# Patient Record
Sex: Female | Born: 1945 | Race: White | Hispanic: No | Marital: Married | Smoking: Current every day smoker
Health system: Southern US, Community
[De-identification: ages and names within clinical notes are randomized; demographics above are authoritative.]

## PROBLEM LIST (undated history)

## (undated) DIAGNOSIS — R3129 Other microscopic hematuria: Secondary | ICD-10-CM

## (undated) DIAGNOSIS — I10 Essential (primary) hypertension: Secondary | ICD-10-CM

## (undated) DIAGNOSIS — K759 Inflammatory liver disease, unspecified: Secondary | ICD-10-CM

## (undated) DIAGNOSIS — E669 Obesity, unspecified: Secondary | ICD-10-CM

## (undated) DIAGNOSIS — K760 Fatty (change of) liver, not elsewhere classified: Secondary | ICD-10-CM

## (undated) DIAGNOSIS — M5020 Other cervical disc displacement, unspecified cervical region: Secondary | ICD-10-CM

## (undated) DIAGNOSIS — E119 Type 2 diabetes mellitus without complications: Secondary | ICD-10-CM

## (undated) DIAGNOSIS — F419 Anxiety disorder, unspecified: Secondary | ICD-10-CM

## (undated) DIAGNOSIS — M79661 Pain in right lower leg: Secondary | ICD-10-CM

## (undated) DIAGNOSIS — M79662 Pain in left lower leg: Secondary | ICD-10-CM

## (undated) DIAGNOSIS — M7989 Other specified soft tissue disorders: Secondary | ICD-10-CM

## (undated) HISTORY — DX: Pain in right lower leg: M79.662

## (undated) HISTORY — DX: Type 2 diabetes mellitus without complications: E11.9

## (undated) HISTORY — PX: CHOLECYSTECTOMY: SHX55

## (undated) HISTORY — DX: Other specified soft tissue disorders: M79.89

## (undated) HISTORY — DX: Essential (primary) hypertension: I10

## (undated) HISTORY — DX: Pain in right lower leg: M79.661

## (undated) HISTORY — DX: Other microscopic hematuria: R31.29

## (undated) HISTORY — DX: Other cervical disc displacement, unspecified cervical region: M50.20

## (undated) HISTORY — DX: Fatty (change of) liver, not elsewhere classified: K76.0

## (undated) HISTORY — PX: HERNIA REPAIR: SHX51

## (undated) HISTORY — DX: Obesity, unspecified: E66.9

---

## 1966-08-09 HISTORY — PX: APPENDECTOMY: SHX54

## 1980-08-09 HISTORY — PX: GALLBLADDER SURGERY: SHX652

## 1996-08-09 HISTORY — PX: TOTAL ABDOMINAL HYSTERECTOMY W/ BILATERAL SALPINGOOPHORECTOMY: SHX83

## 1998-09-29 ENCOUNTER — Other Ambulatory Visit: Admission: RE | Admit: 1998-09-29 | Discharge: 1998-09-29 | Payer: Self-pay | Admitting: Obstetrics and Gynecology

## 2005-05-10 ENCOUNTER — Ambulatory Visit: Payer: Self-pay

## 2005-05-10 IMAGING — CR DG KNEE COMPLETE 4+V*R*
1 series · 4 of 4 positions shown · non-contrast
Comparison: none

REASON FOR EXAM: KNEE PAIN
COMMENTS:

[Series 1: view not recorded · 0.17mm/px · 4 of 4 slices shown]
[im 1/4]
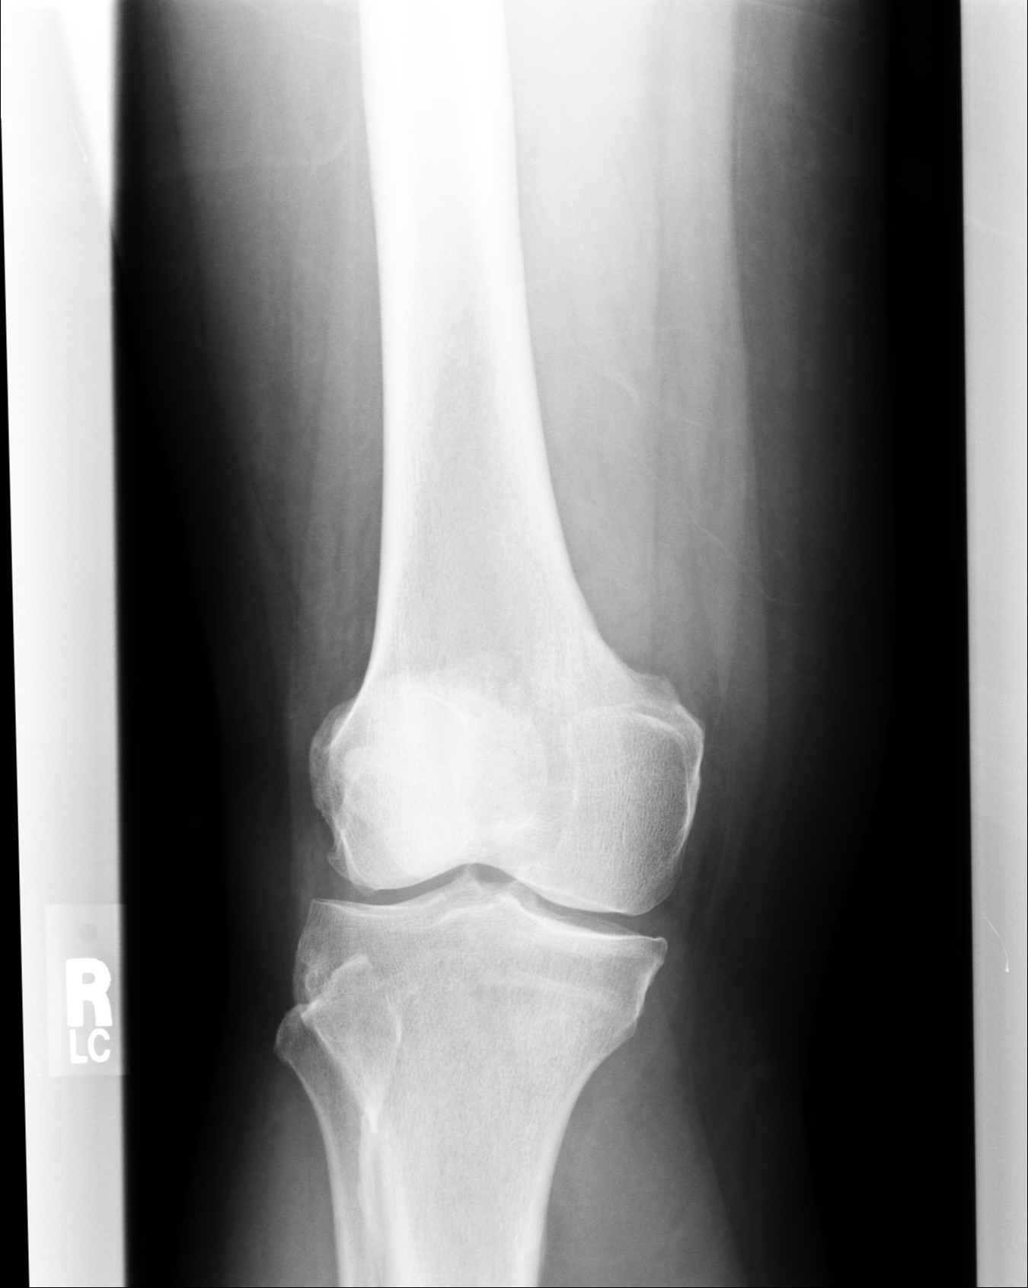
[im 2/4]
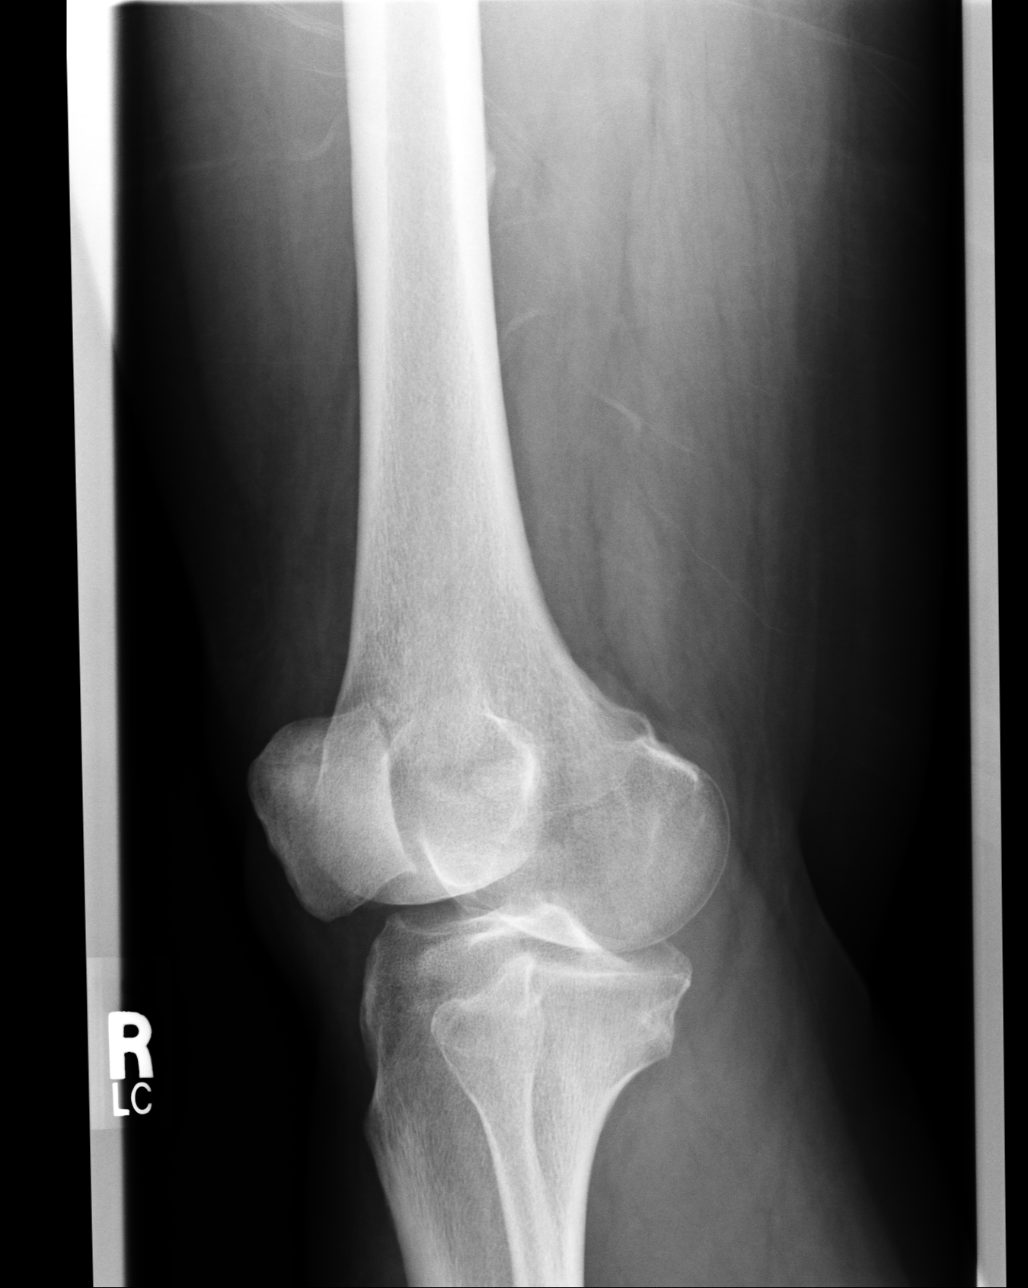
[im 3/4]
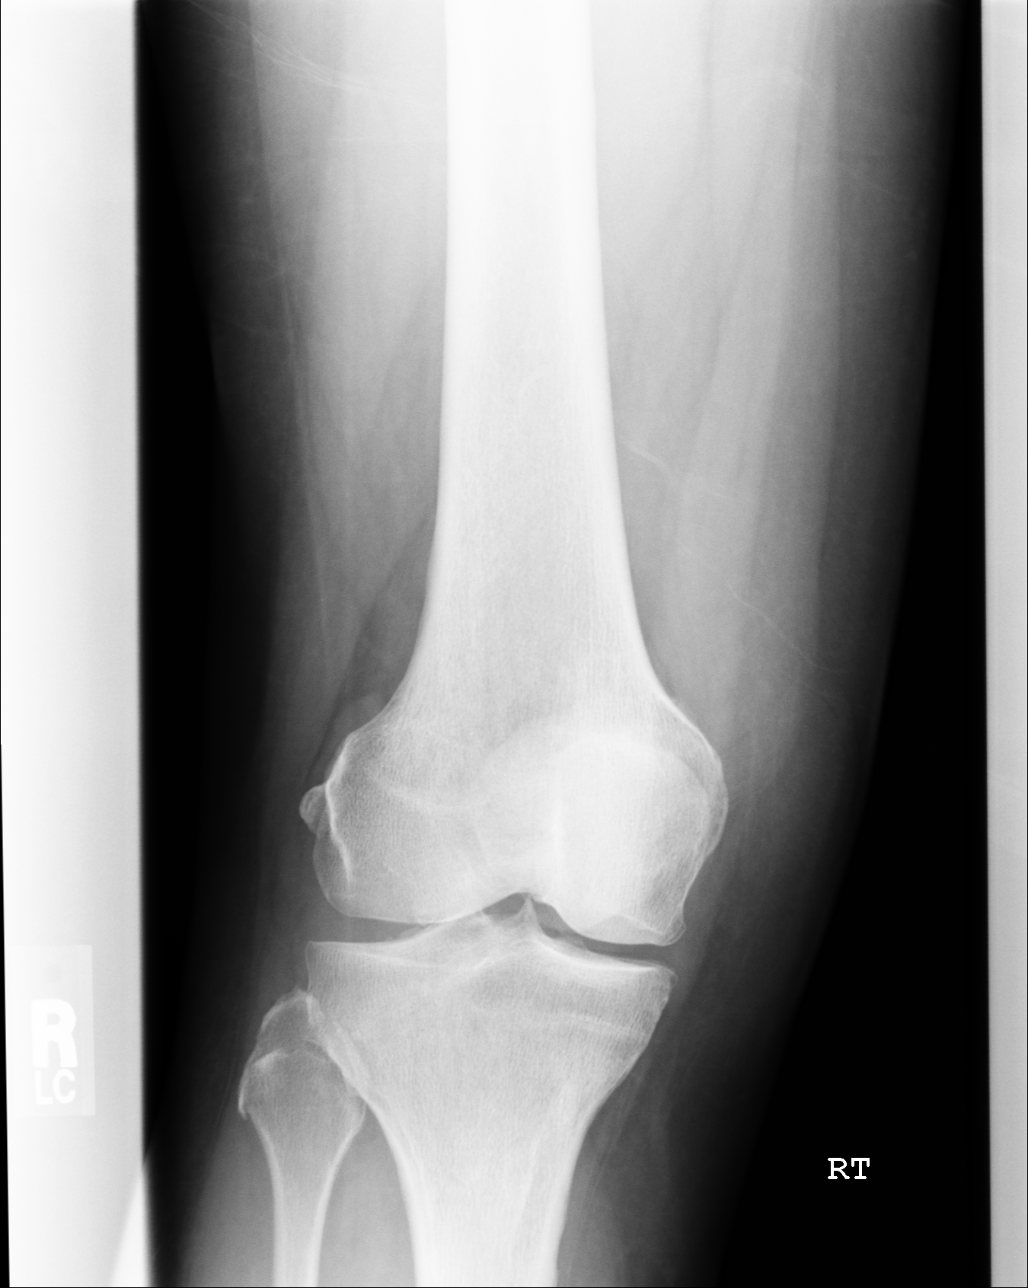
[im 4/4]
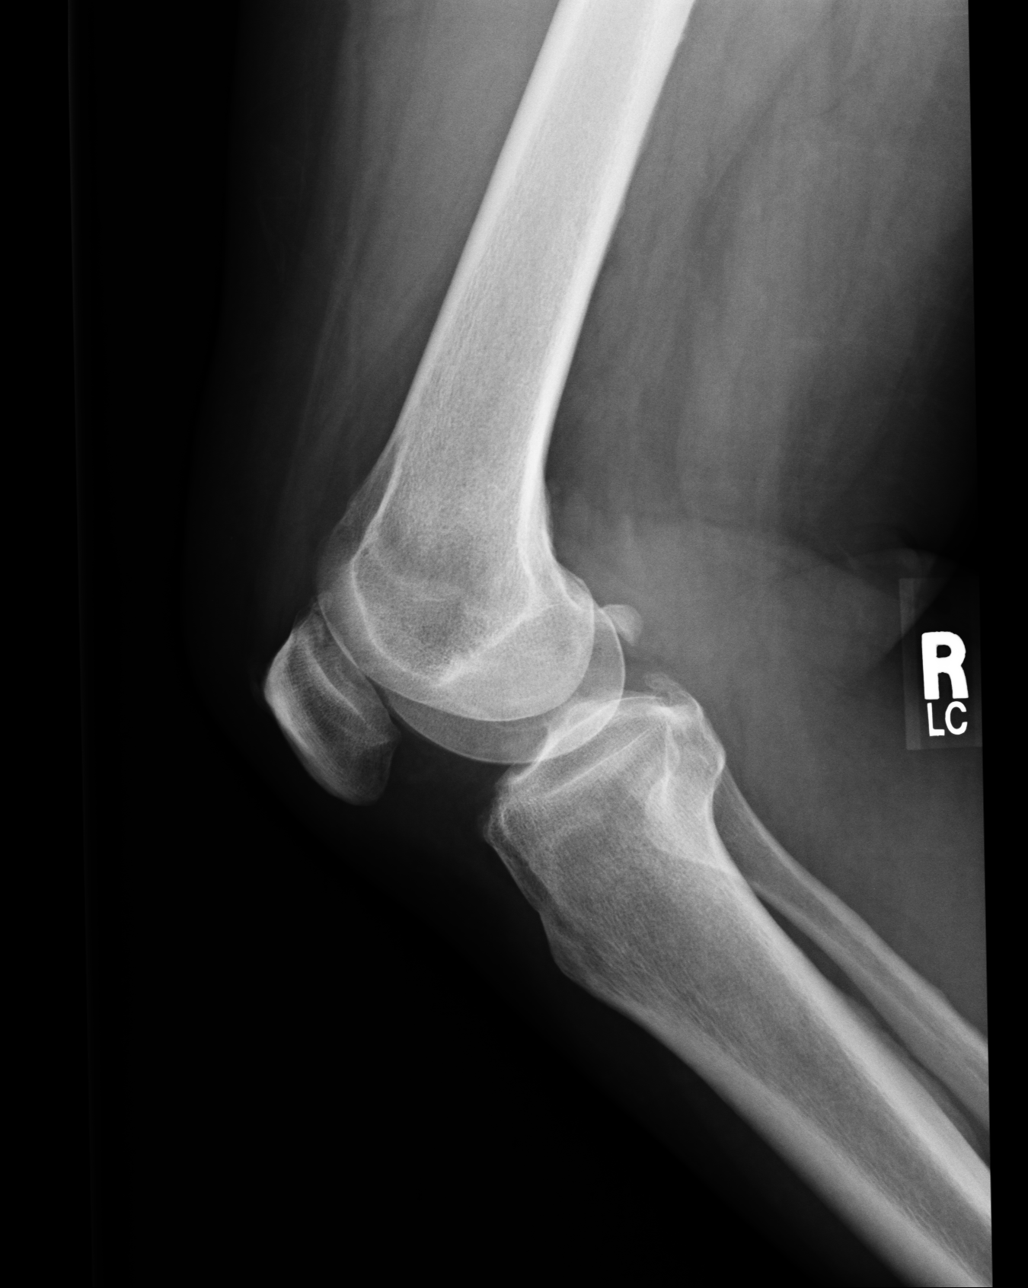

[4 of 4 positions shown; findings below may reference images not displayed]

PROCEDURE:     DXR - DXR KNEE RT COMP WITH OBLIQUES  - [DATE]  [DATE]

RESULT:       Four views of the RIGHT knee show no fracture, dislocation or
other acute bony abnormality.  There is noted very slight hypertrophic
spurring at the knee medially and laterally.  The knee joint space is well
maintained.  The patella is intact.
IMPRESSION: 1.     No acute bony abnormalities are seen.
2.     There is very slight hypertrophic spurring about the knee medially
and laterally.

## 2006-02-03 ENCOUNTER — Ambulatory Visit: Payer: Self-pay | Admitting: Gastroenterology

## 2006-06-16 ENCOUNTER — Ambulatory Visit: Payer: Self-pay | Admitting: Internal Medicine

## 2009-09-09 ENCOUNTER — Ambulatory Visit: Payer: Self-pay | Admitting: Internal Medicine

## 2009-09-09 IMAGING — US US RENAL KIDNEY
1 series · 14 of 25 positions shown · non-contrast
Comparison: none

REASON FOR EXAM: hematuria
COMMENTS:

[Series 1: us renal kidney · 0.39mm/px · 14 of 36 slices shown]
[im 1/36]
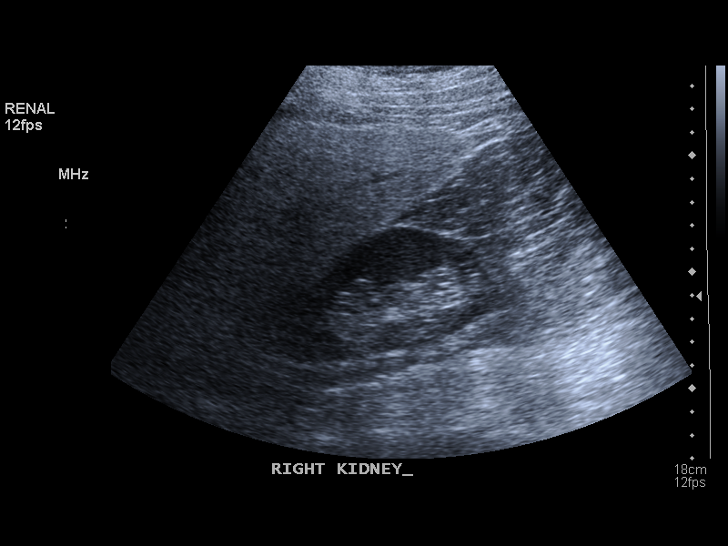
[im 3/36]
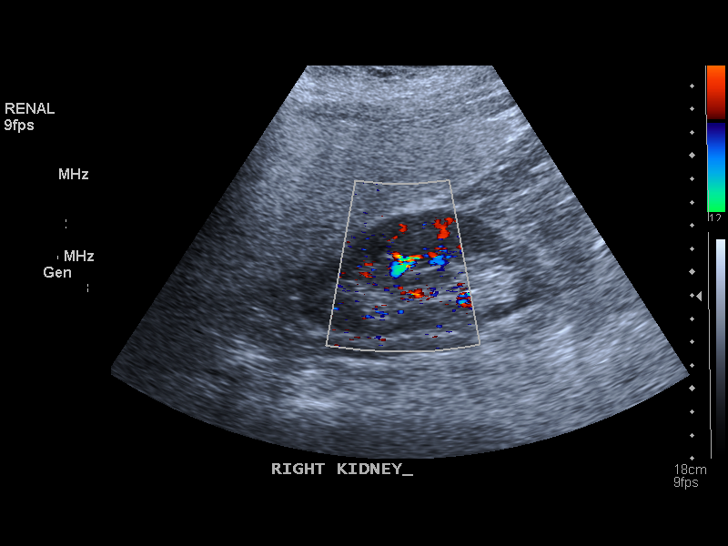
[im 6/36]
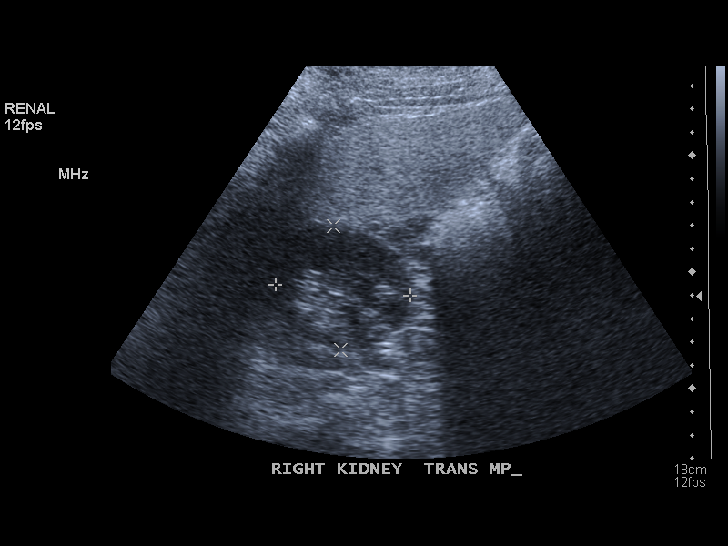
[im 9/36]
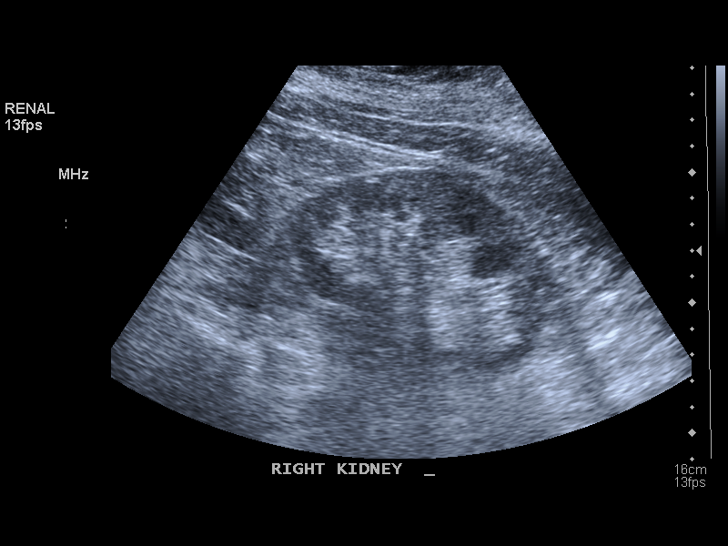
[im 12/36]
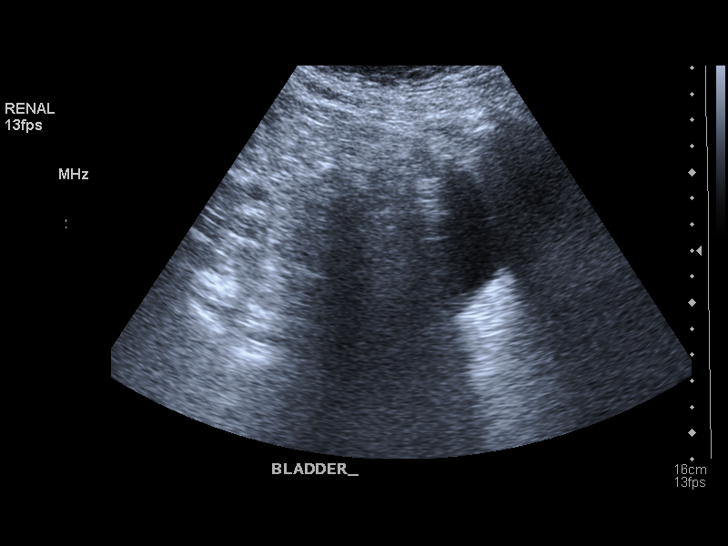
[im 14/36]
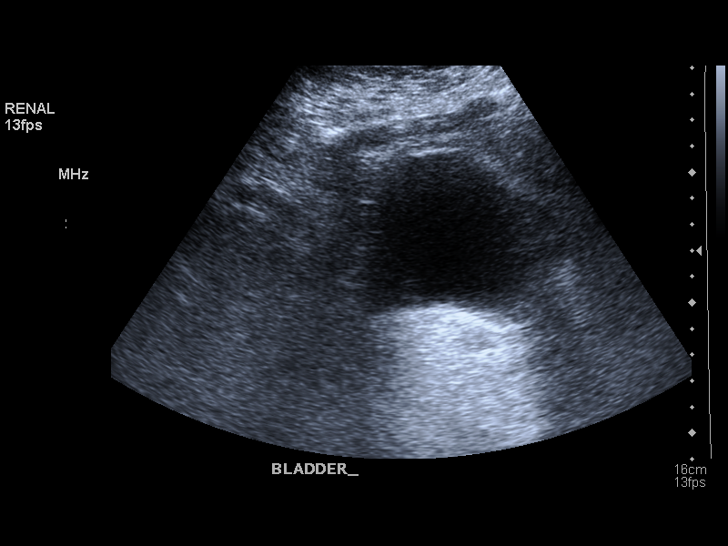
[im 17/36]
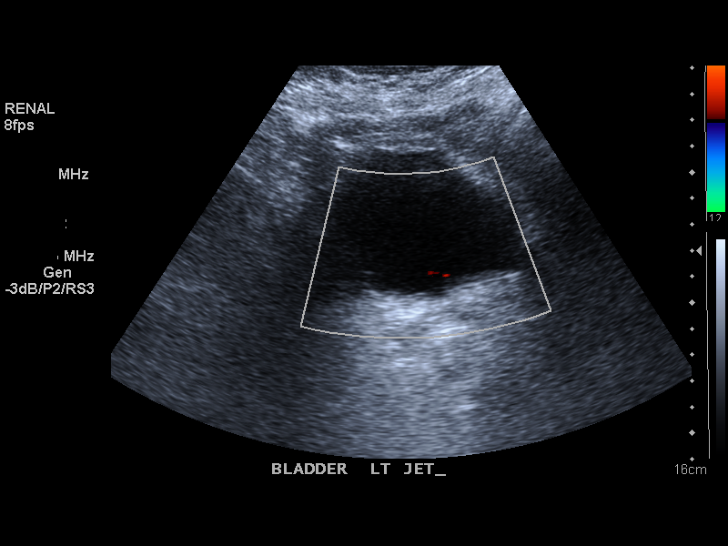
[im 19/36]
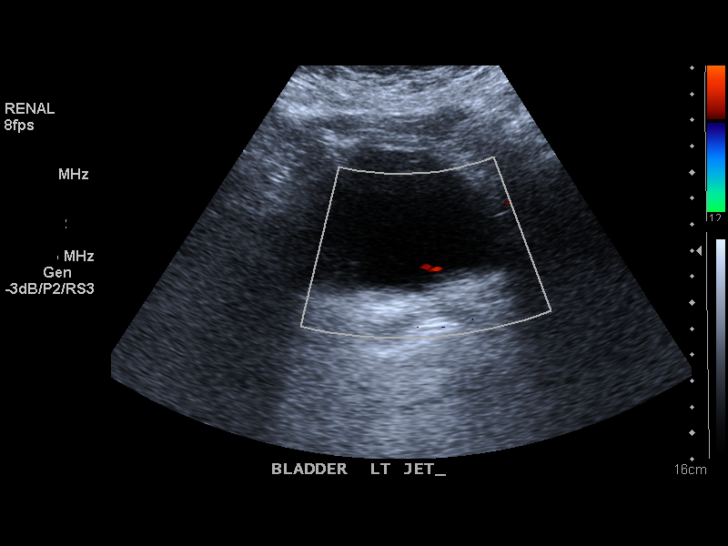
[im 22/36]
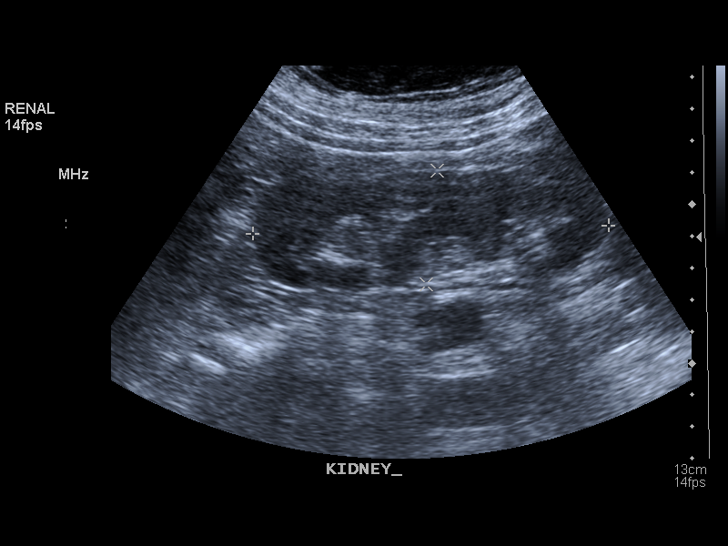
[im 24/36]
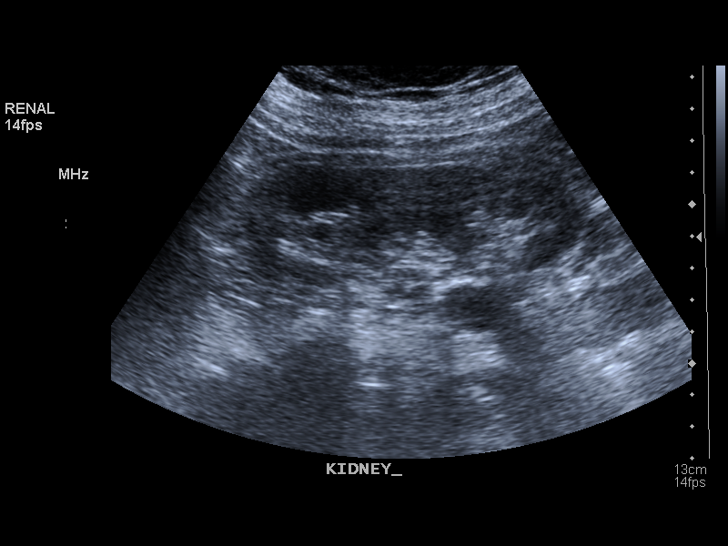
[im 27/36]
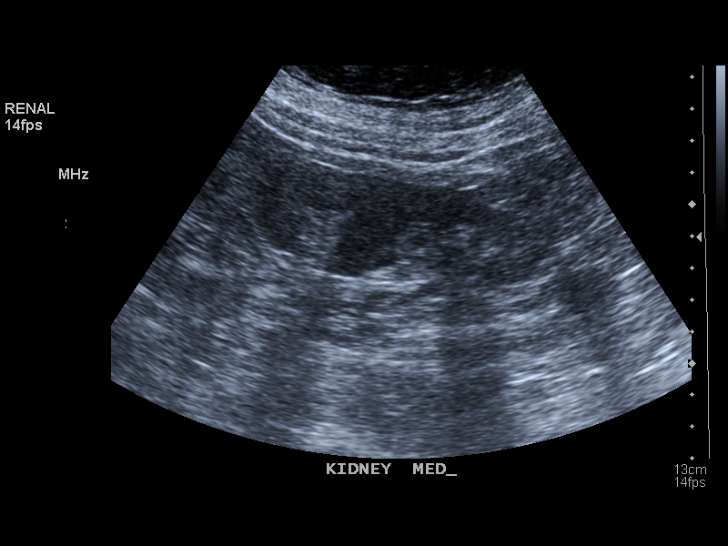
[im 30/36]
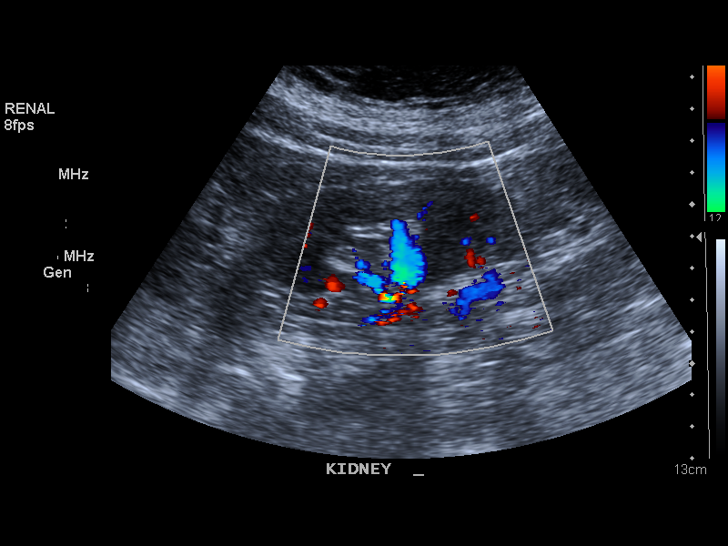
[im 33/36]
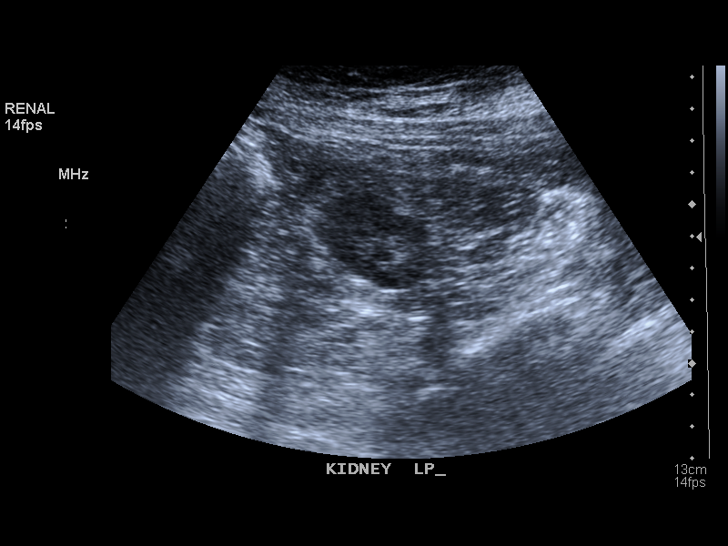
[im 36/36]
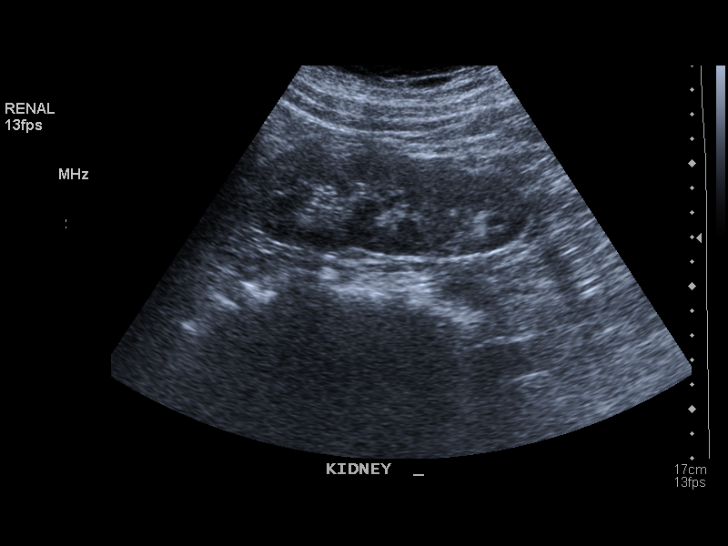

[14 of 25 positions shown; findings below may reference images not displayed]

PROCEDURE:     US  - US KIDNEY  - [DATE]  [DATE]

RESULT:

The right kidney measures 11.2 x 5.8 x 5.3 cm and the left 11.2 x 4 x
cm. There is appropriate corticomedullary differentiation without evidence
of hydronephrosis, masses or calculi. The urinary bladder is partially
distended with urine. Bilateral ureteral jets are identified within the
bladder.
IMPRESSION: Unremarkable Renal Ultrasound.

## 2010-01-26 ENCOUNTER — Ambulatory Visit: Payer: Self-pay | Admitting: General Practice

## 2010-01-26 IMAGING — CT CT ABDOMEN AND PELVIS WITHOUT AND WITH CONTRAST
1 of 5 series · 8 of 32 positions shown, 13 images · non-contrast
Comparison: none

REASON FOR EXAM: hematuria
COMMENTS:

PROCEDURE:     CT  - CT ABDOMEN / PELVIS  W/WO  - [DATE] [DATE]
RESULT:
TECHNIQUE: Triphasic CT of the abdomen and pelvis is performed utilizing 100
ml of [Q7] iodinated intravenous contrast. No oral contrast was
administered.
There is no previous exam for comparison.

[Series 5: delay · axial · delayed · 0.89mm/px · z∈[-592,-236]mm · 8 of 93 slices shown, 13 images]
[im 11/93  soft-tissue]
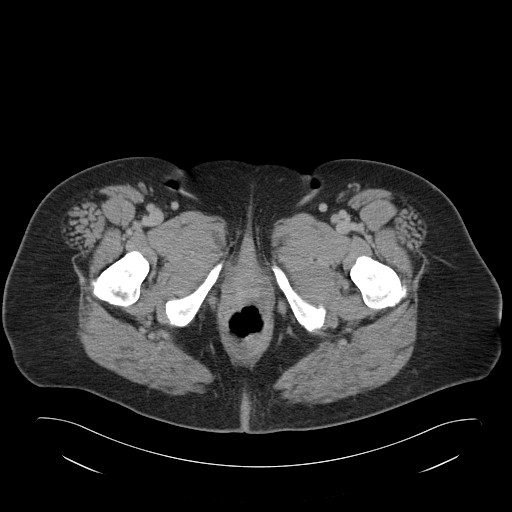
[im 11/93  bone]
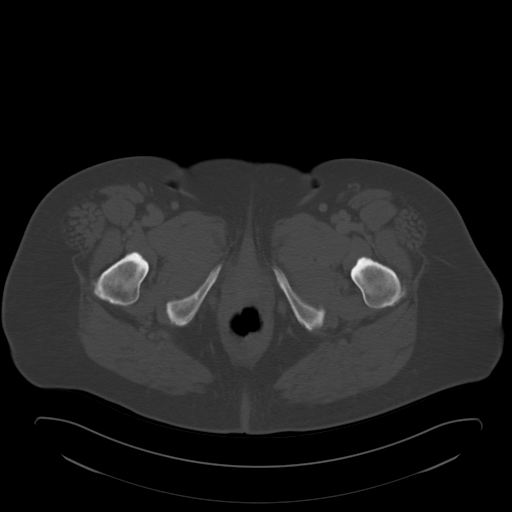
[im 21/93  soft-tissue]
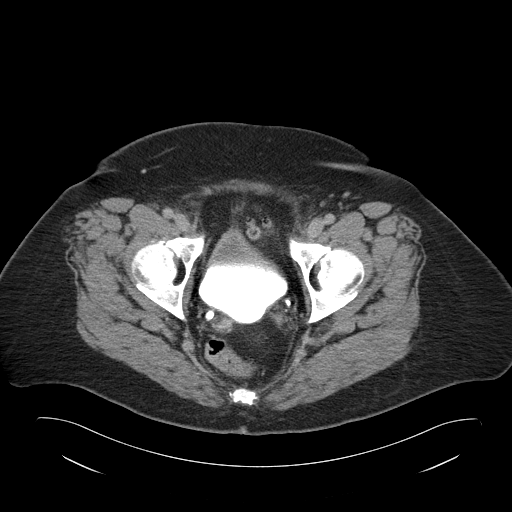
[im 31/93  soft-tissue]
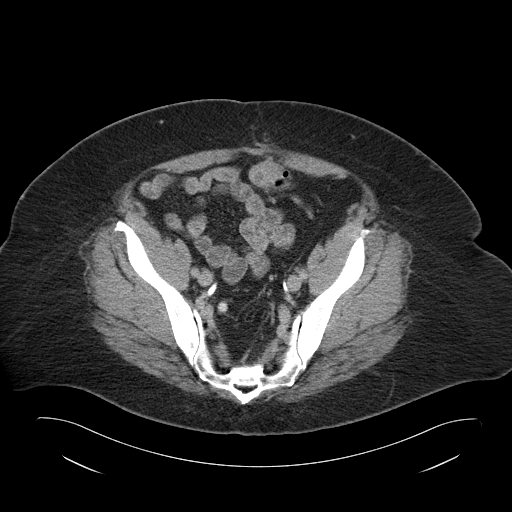
[im 41/93  soft-tissue]
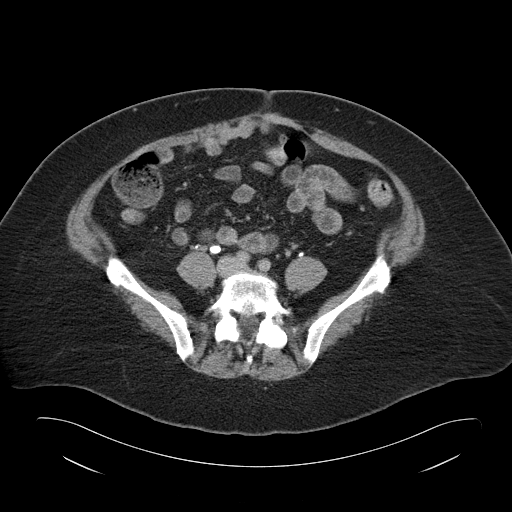
[im 52/93  soft-tissue]
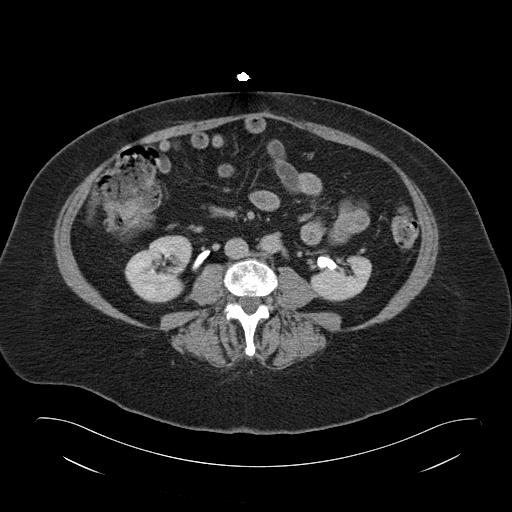
[im 52/93  lung]
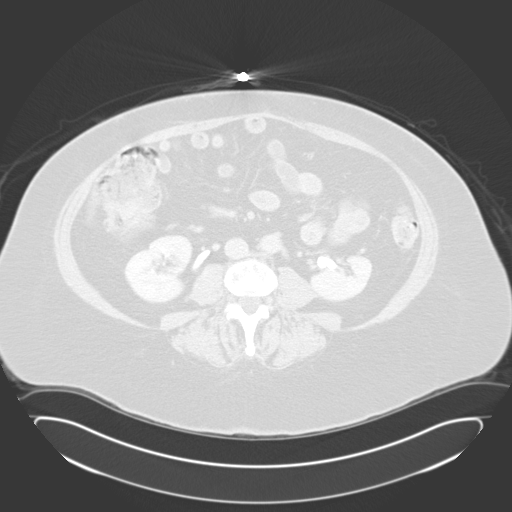
[im 62/93  soft-tissue]
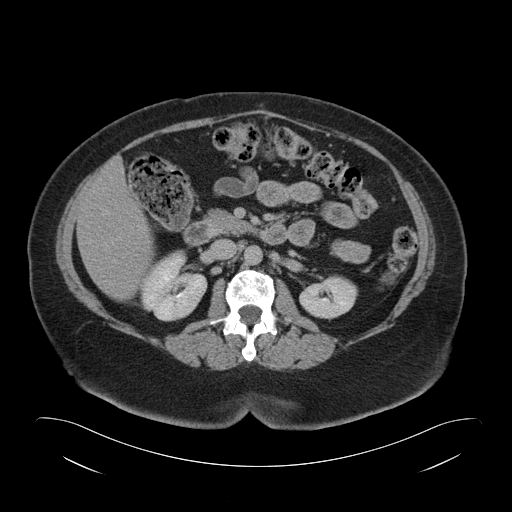
[im 62/93  lung]
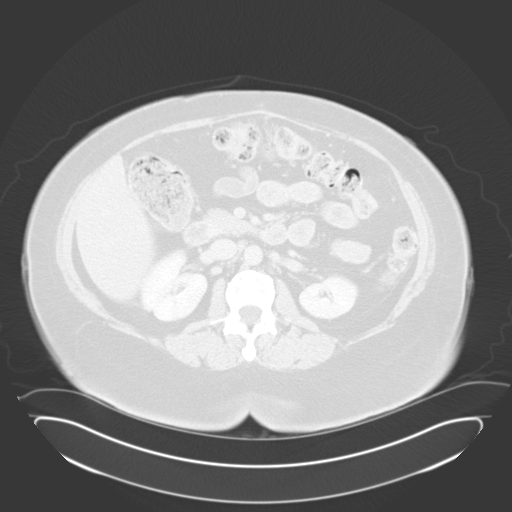
[im 72/93  soft-tissue]
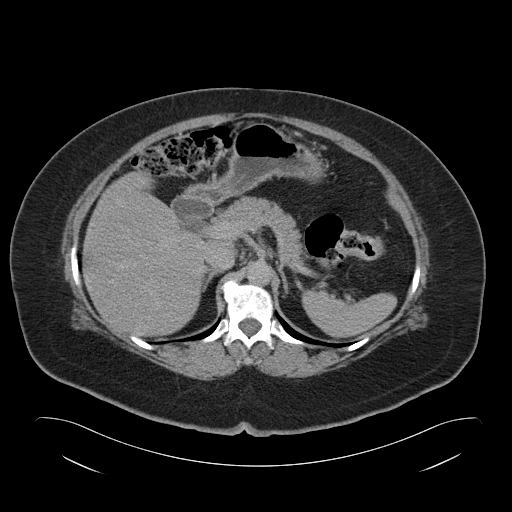
[im 72/93  lung]
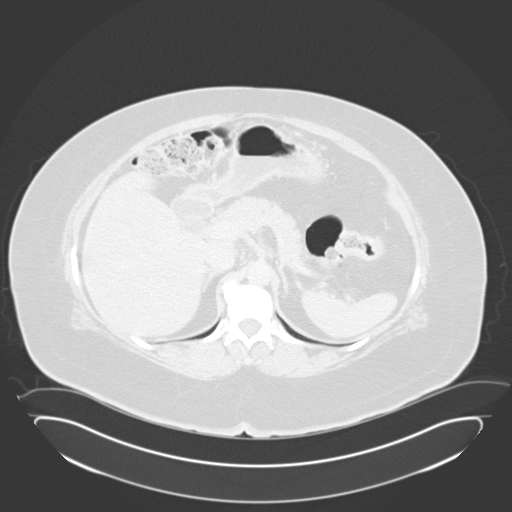
[im 82/93  soft-tissue]
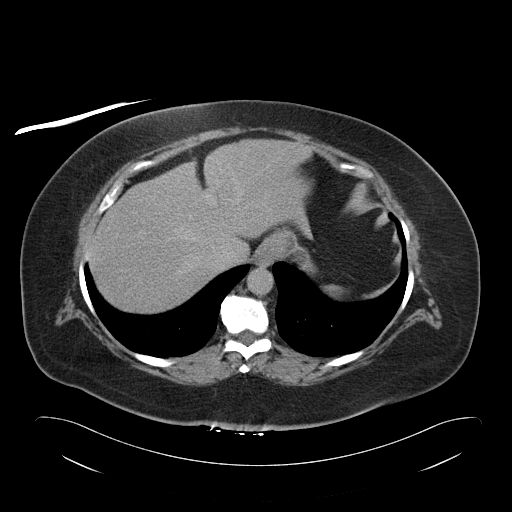
[im 82/93  lung]
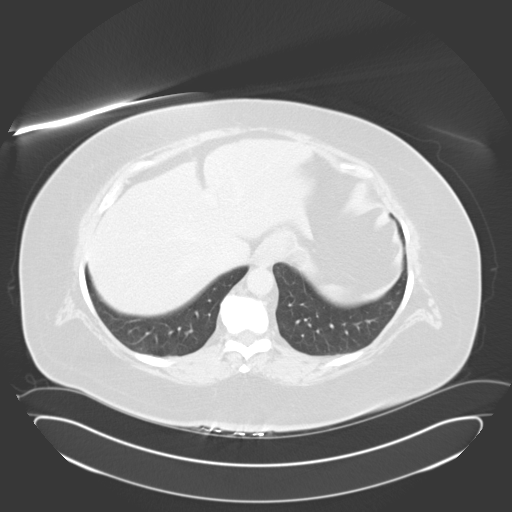

[8 of 32 positions shown; findings below may reference images not displayed]

FINDINGS: There is dependent subsegmental or subpleural linear density in
the lower lobes suggestive of dependent atelectasis. Noncontrast images
through the abdomen and pelvis demonstrate the left kidney is slightly
smaller than the right. There is no urinary obstruction or evidence of
nephrolithiasis. Both kidneys enhance homogeneously following intravenous
administration of iodinated contrast. The spleen, liver, pancreas, adrenal
glands and aorta appear to be unremarkable. There is no ascites, free air or
abnormal free fluid or fluid collection. Scattered colonic diverticulosis is
seen especially in the sigmoid region. No abnormal bowel distention or bowel
wall thickening is evident. There is excretion of contrast opacified urine
by both kidneys. No filling defect is evident within the bladder. The
abdominal wall appears intact. Cholecystectomy clips are present.
IMPRESSION: 1.  No nephrolithiasis, urinary obstruction or discrete renal mass.
2.  Status post cholecystectomy.
3.  The urinary bladder is nondistended but no discrete mass is evident.
4.  Scattered colonic diverticulosis.

## 2011-04-06 ENCOUNTER — Ambulatory Visit: Payer: Self-pay | Admitting: Internal Medicine

## 2011-04-06 IMAGING — US ABDOMEN ULTRASOUND LIMITED
1 series · 17 of 25 positions shown · non-contrast
Comparison: none

REASON FOR EXAM: abn liver function
COMMENTS:

[Series 1: abdomen ultrasound limited · 17 of 69 slices shown]
[im 1/69]
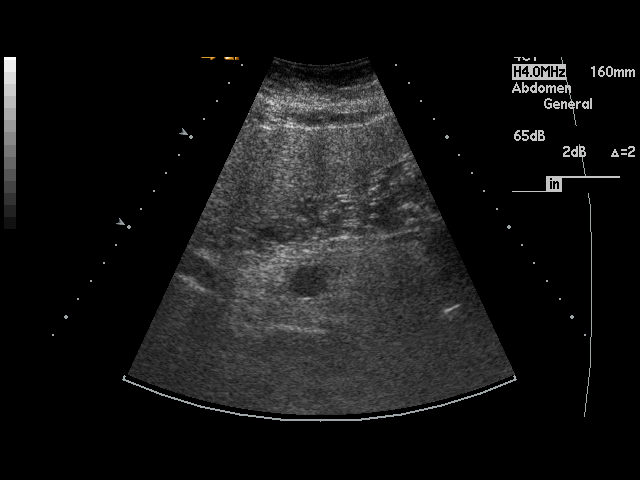
[im 6/69]
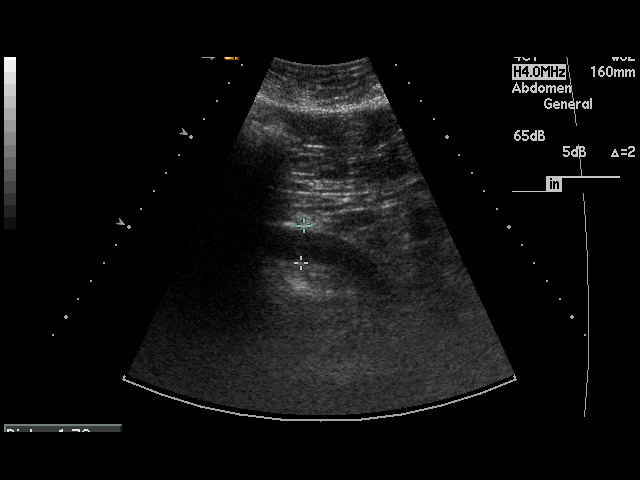
[im 9/69]
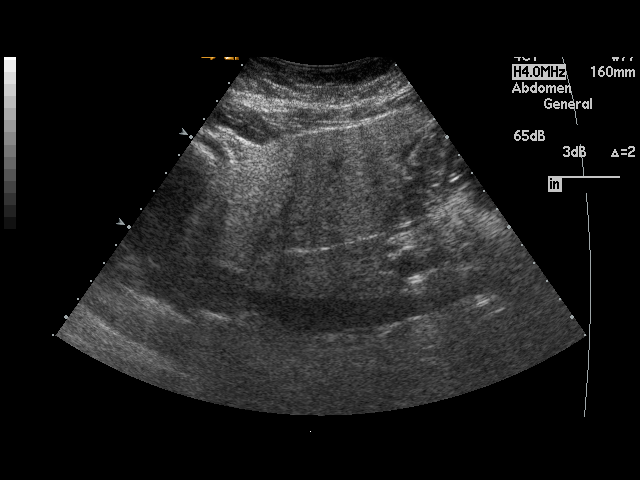
[im 15/69]
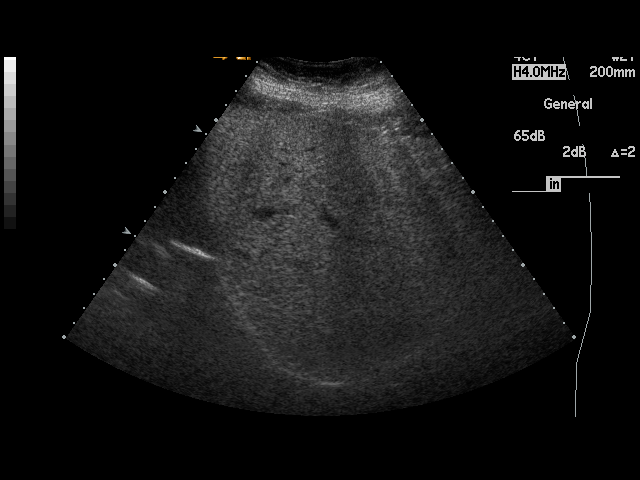
[im 18/69]
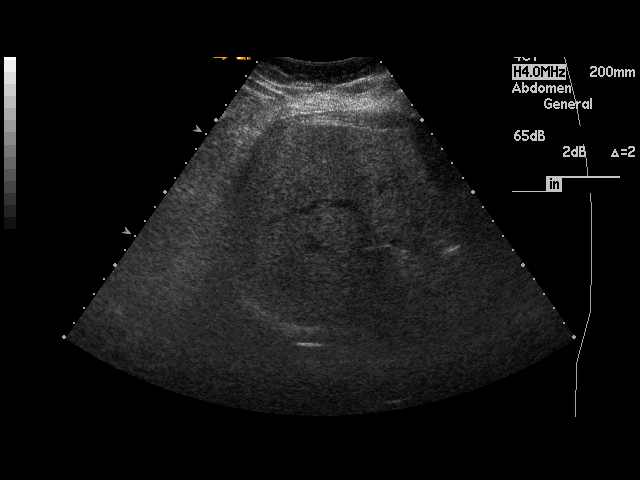
[im 23/69]
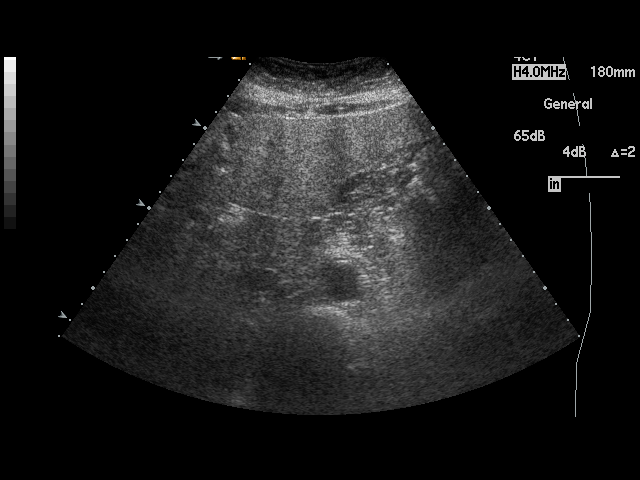
[im 26/69]
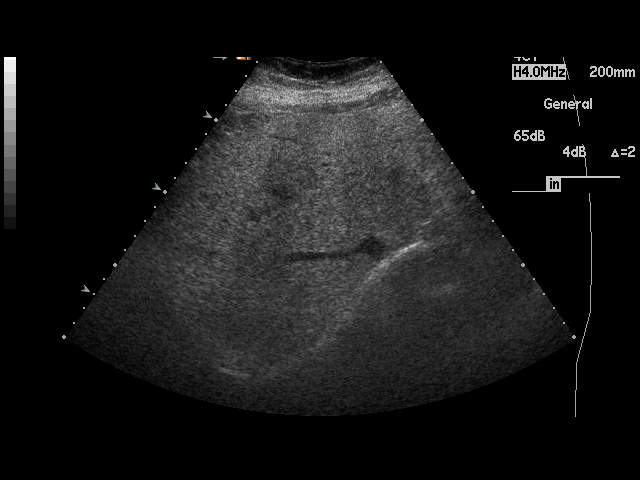
[im 32/69]
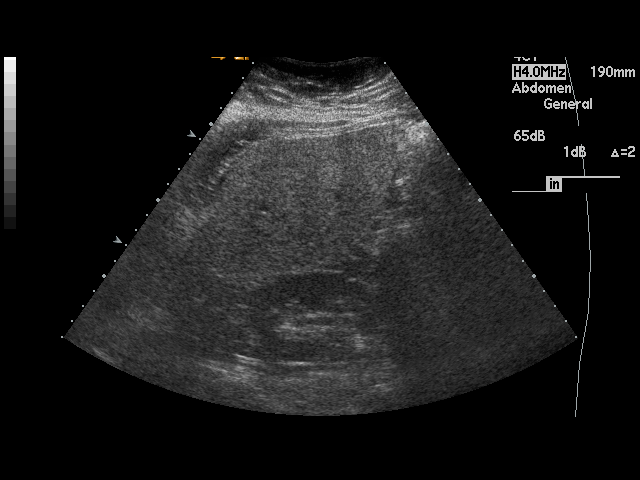
[im 35/69]
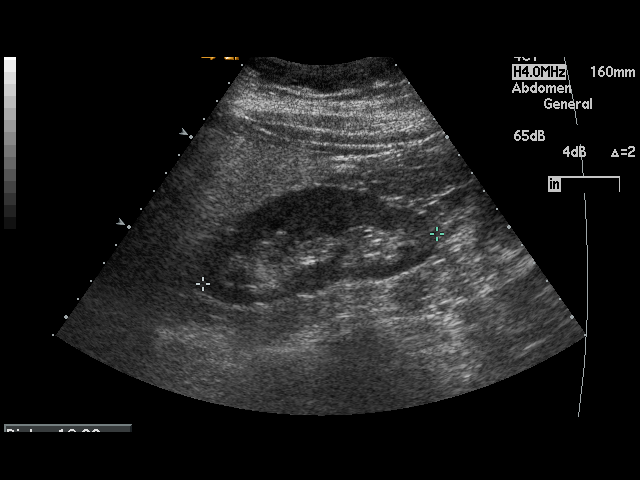
[im 37/69]
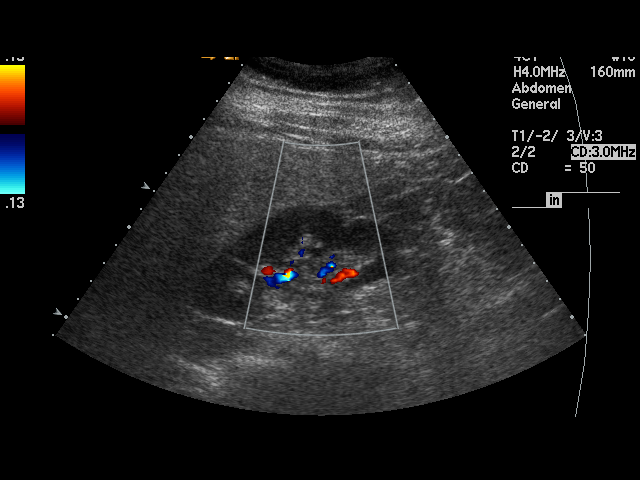
[im 43/69]
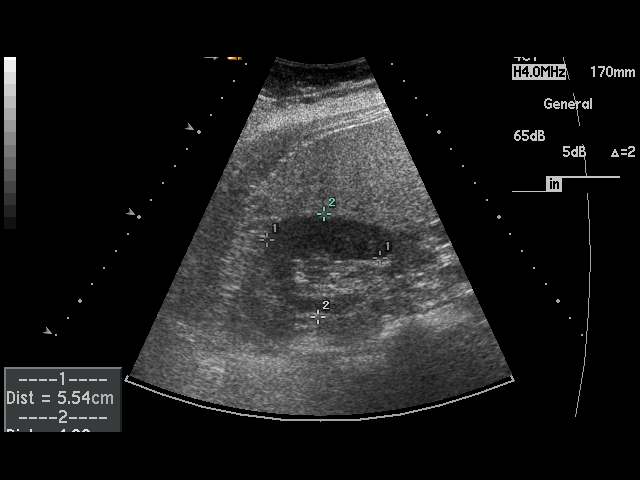
[im 46/69]
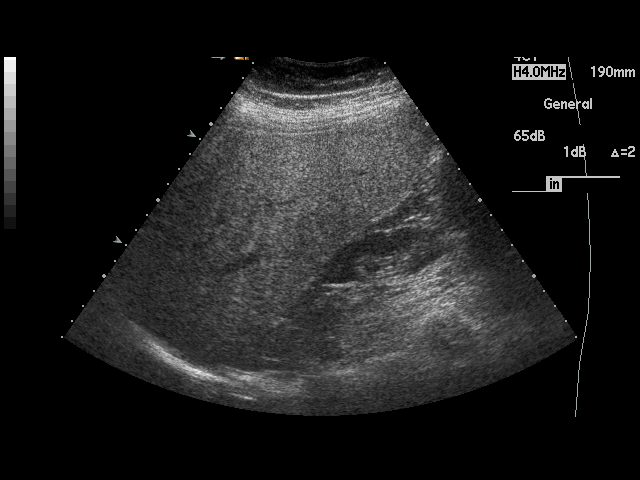
[im 52/69]
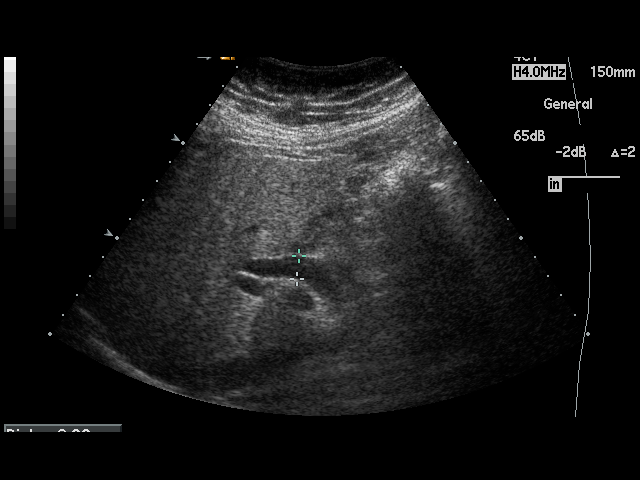
[im 54/69]
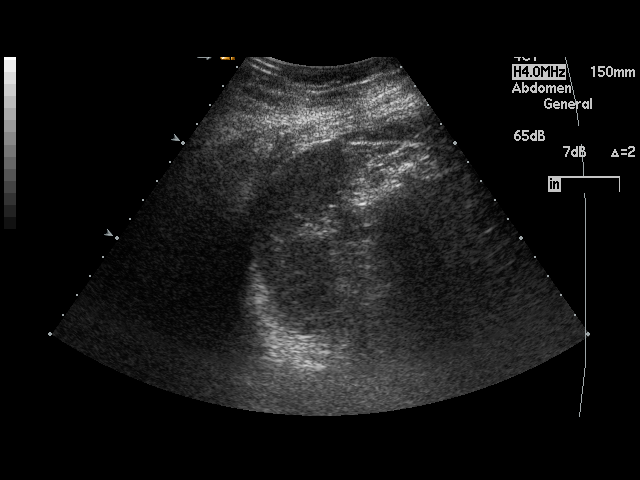
[im 60/69]
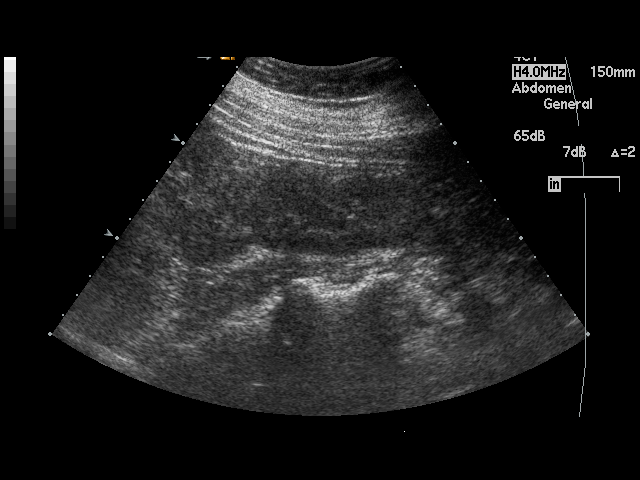
[im 63/69]
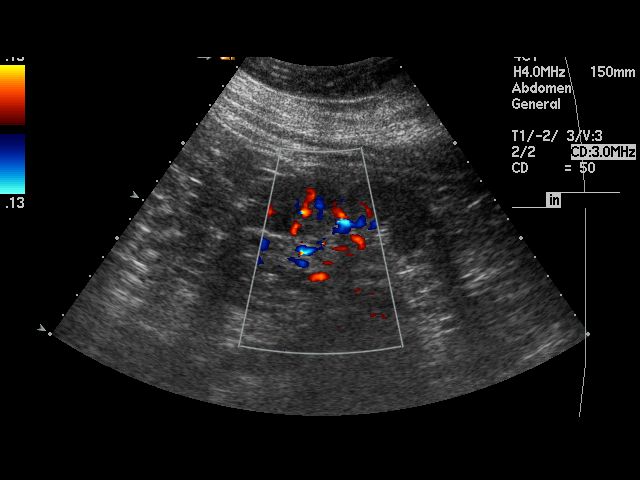
[im 69/69]
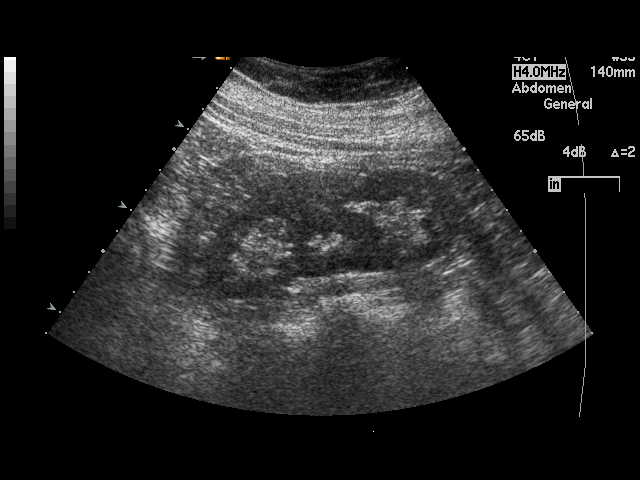

[17 of 25 positions shown; findings below may reference images not displayed]

PROCEDURE:     ALOZIE - ALOZIE ABDOMEN UPPER GENERAL  - [DATE]  [DATE]

RESULT:     The liver is hyperechogenic, suspicious for fatty infiltration.
No focal hepatic mass lesions are seen. A portion of the pancreatic tail is
obscured by gas but otherwise the pancreas is normal in appearance. The
abdominal aorta, inferior vena cava and spleen show no significant
abnormalities. The gallbladder is not seen compatible with prior
cholecystectomy. The common bile duct measures 9.9 mm in diameter which is
prominent but which can be within normal limits for the post cholecystectomy
patient. No stone is seen in the common duct and no dilated intrahepatic
ducts are seen. Nonetheless, if clinically warranted, the common duct could
be further evaluated by biliary nuclide scan. The kidneys show no
hydronephrosis. Sagittally the right kidney measures 10.93 cm and the left
kidney measures 10.35 cm. No ascites is seen.
IMPRESSION: 1. Probable fatty infiltration of the liver.
2. The patient is status post cholecystectomy.
3. There is prominence of the common bile duct which measures 9.9 mm in
diameter. This is a nonspecific finding in the postcholecystectomy patient.
If, however, there are laboratory values suggesting partial common duct
obstruction, then further evaluation by biliary nuclide scan might be useful.

## 2011-08-10 HISTORY — PX: BREAST BIOPSY: SHX20

## 2011-12-10 ENCOUNTER — Ambulatory Visit: Payer: Self-pay | Admitting: Internal Medicine

## 2011-12-10 IMAGING — MG MM CAD SCREENING MAMMO
1 series · 4 of 4 positions shown · non-contrast
Comparison: none

REASON FOR EXAM: SCR MAMMO NO ORDER
COMMENTS:

PROCEDURE:     MAM - MAM DGTL SCRN MAM NO ORDER W/CAD  - [DATE] [DATE]
RESULT:     COMPARISON:  [DATE]
TECHNIQUE: Digital screening mammograms were obtained. FDA approved
computer-aided detection (CAD) for mammography was utilized for this study.

[Series 5830: R CC · right · 4 of 4 slices shown]
[im 1/4]
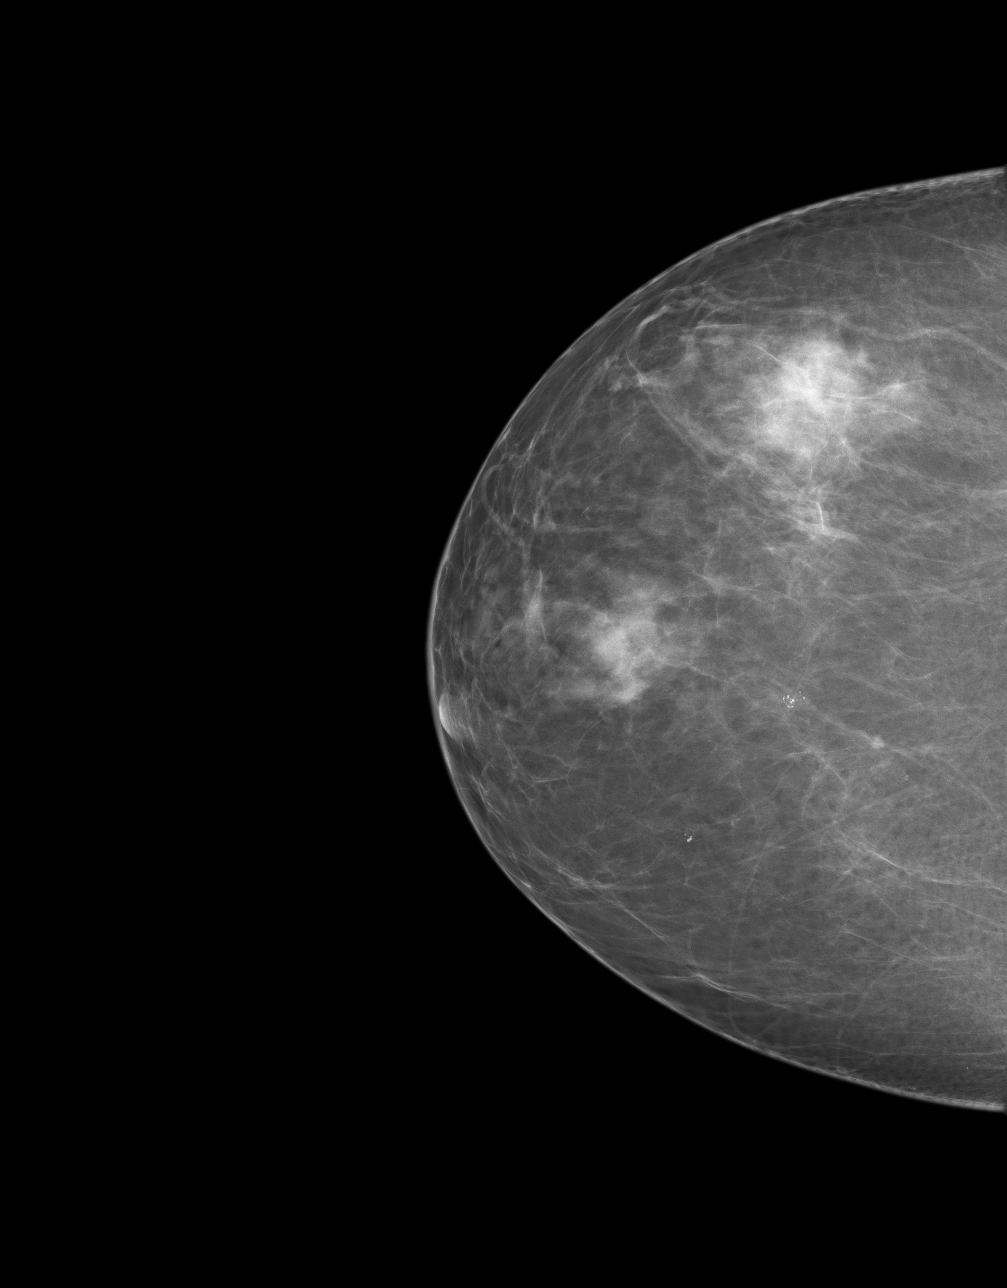
[im 2/4]
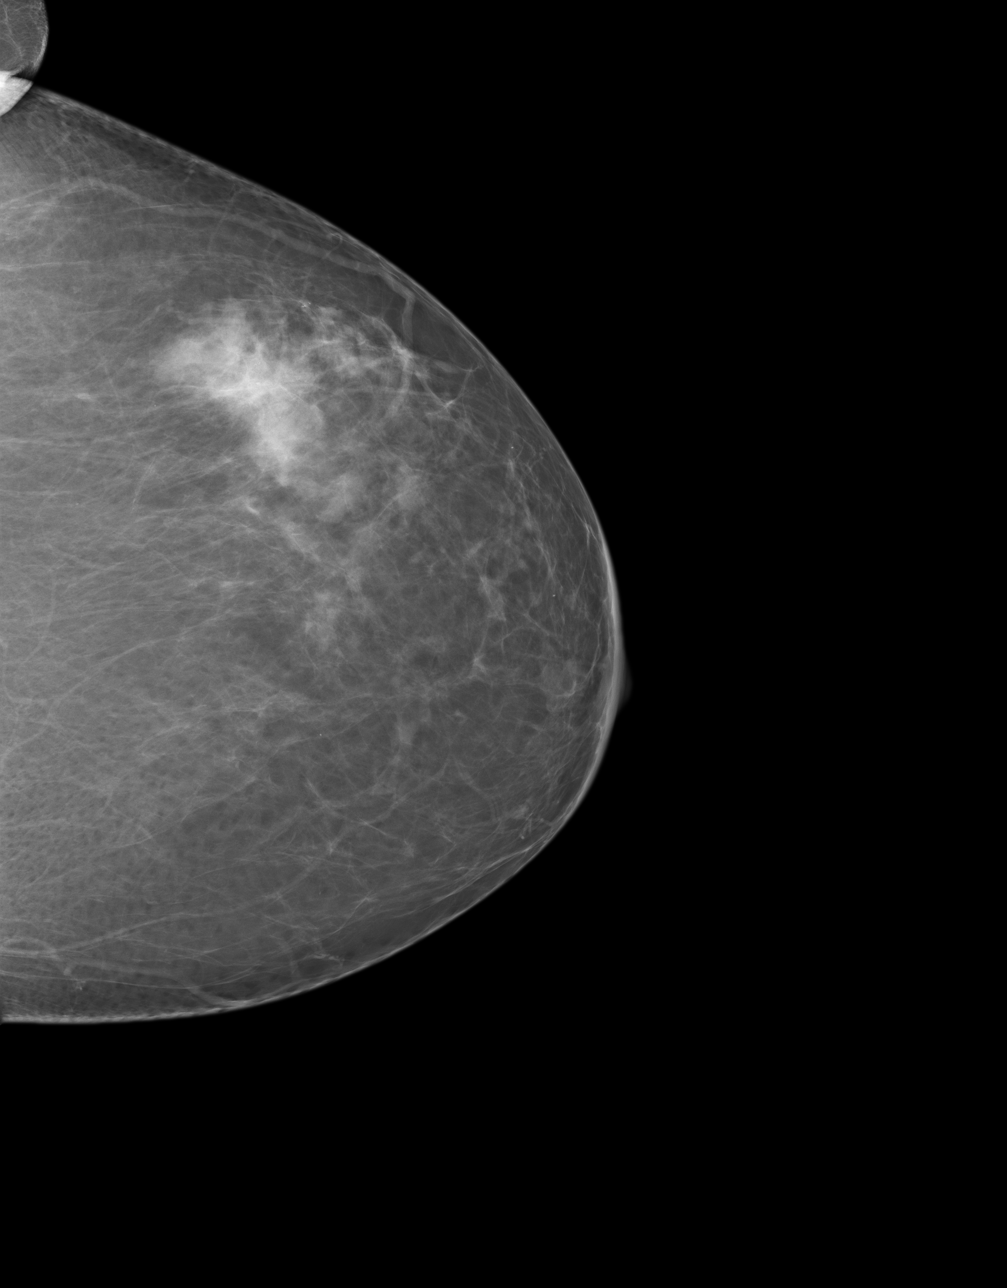
[im 3/4]
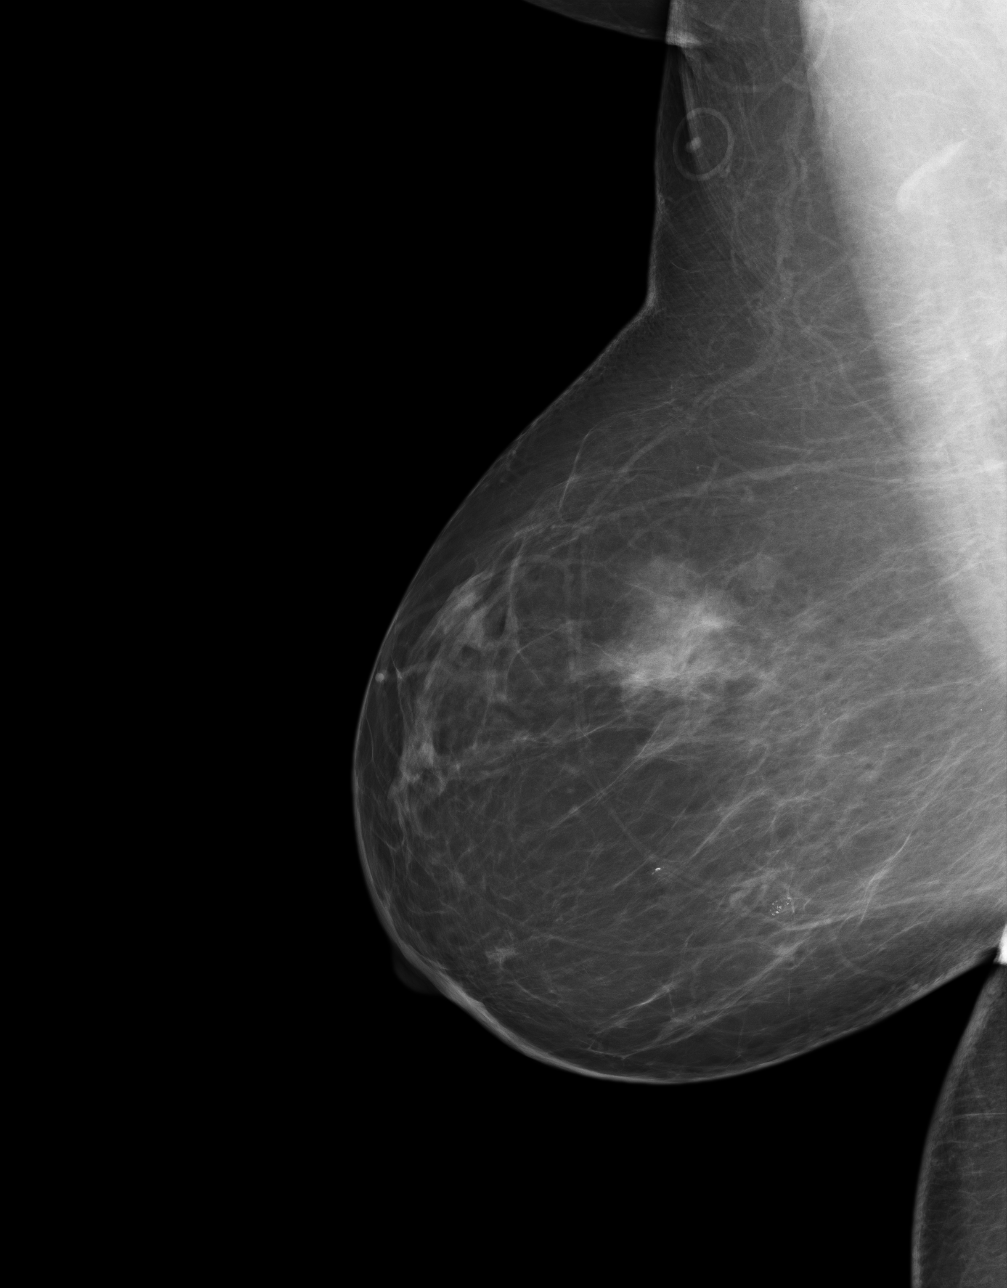
[im 4/4]
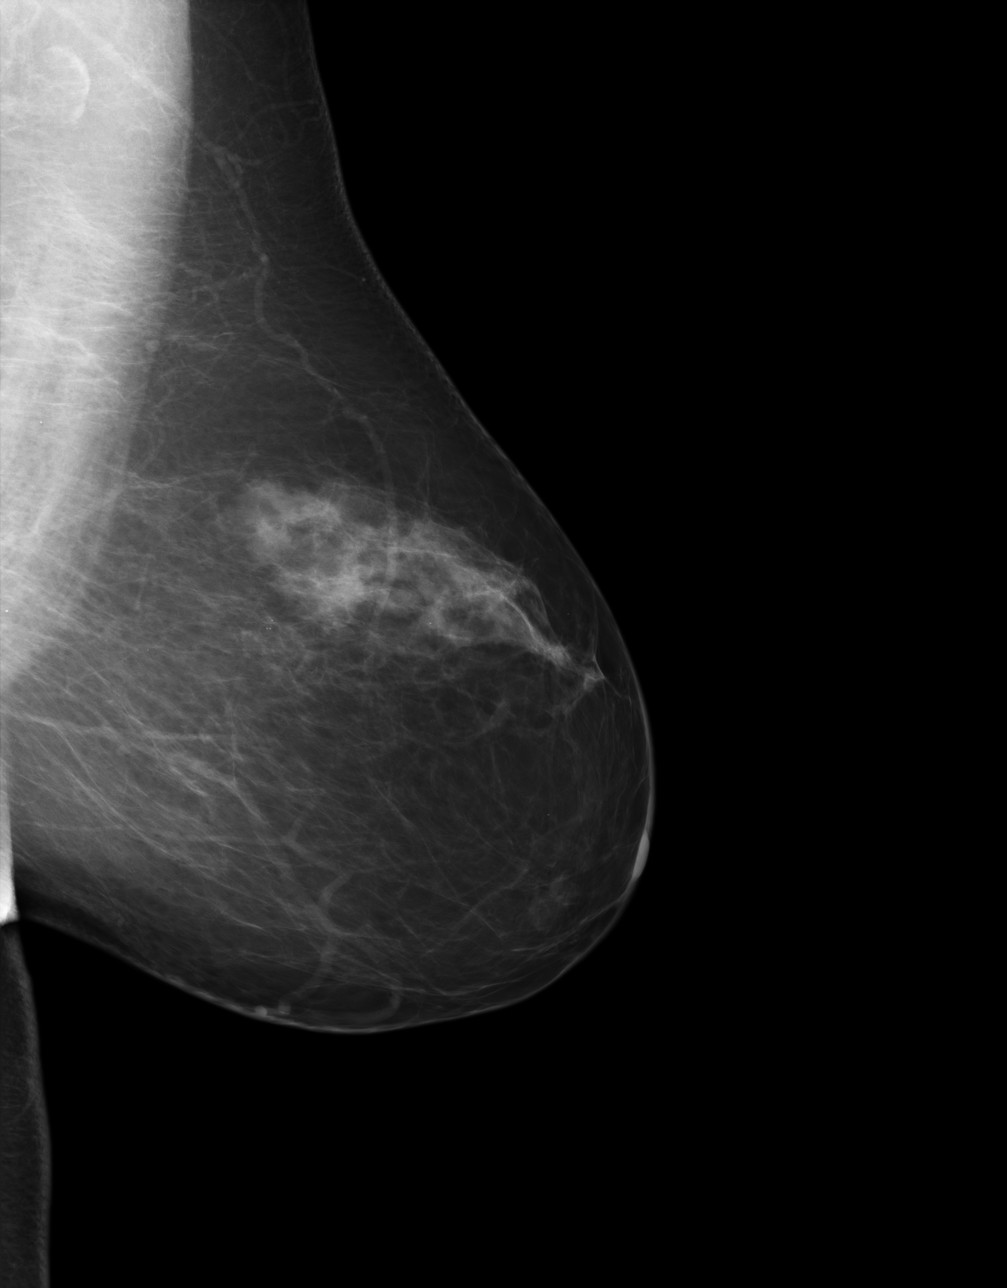

[4 of 4 positions shown; findings below may reference images not displayed]

FINDING: Bilateral breasts demonstrate a scattered fibroglandular density.  There is
a cluster of microcalcifications in the inferior central right breast.
Recommend spot compression magnification views.

There is no dominant mass or architectural distortion.
IMPRESSION: 1.     There is a cluster of microcalcifications in the inferior central
right breast. Recommend spot compression magnification views.

BI-RADS:  Category 0 - Needs Additional Imaging Evaluation

A negative mammogram report does not preclude biopsy or other evaluation of
a clinically palpable or otherwise suspicious mass or lesion. Breast cancer
may not be detected by mammography in up to 10% of cases.

[REDACTED]

## 2011-12-14 ENCOUNTER — Ambulatory Visit: Payer: Self-pay | Admitting: Internal Medicine

## 2011-12-14 IMAGING — MG MM ADDITIONAL VIEWS AT NO CHARGE
1 series · 4 of 4 positions shown · non-contrast
Comparison: none

REASON FOR EXAM: AV RT MICROCALCS
COMMENTS:

PROCEDURE:     MAM - MAM DIG ADDVIEWS RT SCR  - [DATE]  [DATE]
RESULT:     TECHNIQUE: Digital diagnostic right mammograms were obtained.
FDA approved computer-aided detection (CAD) for mammography was utilized for
this study.

[R ML · right · 4 of 4 slices shown]
[im 1/4]
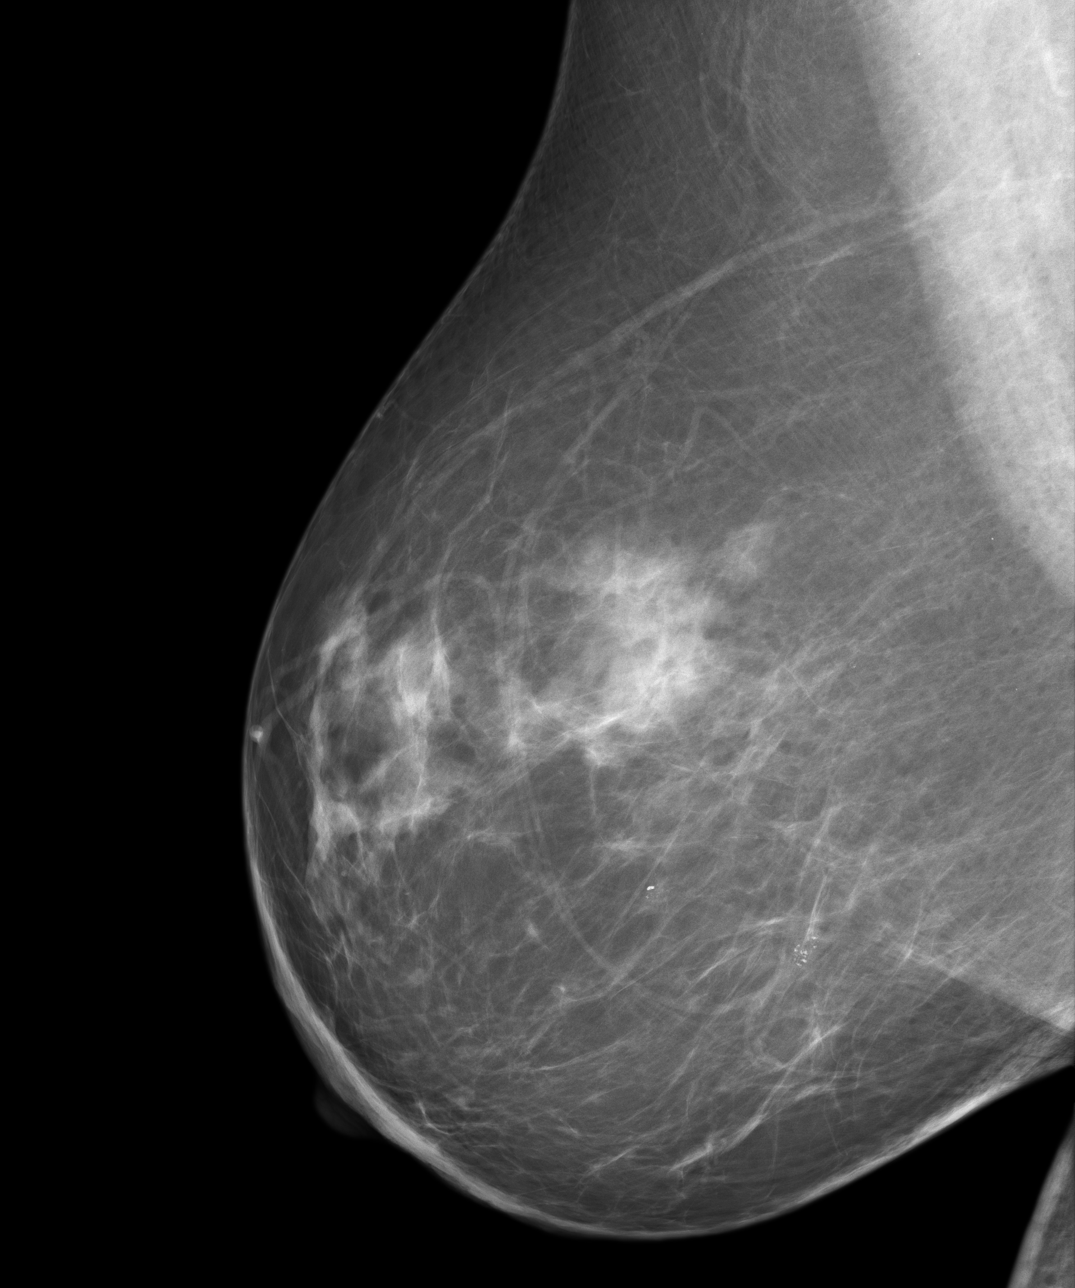
[im 2/4]
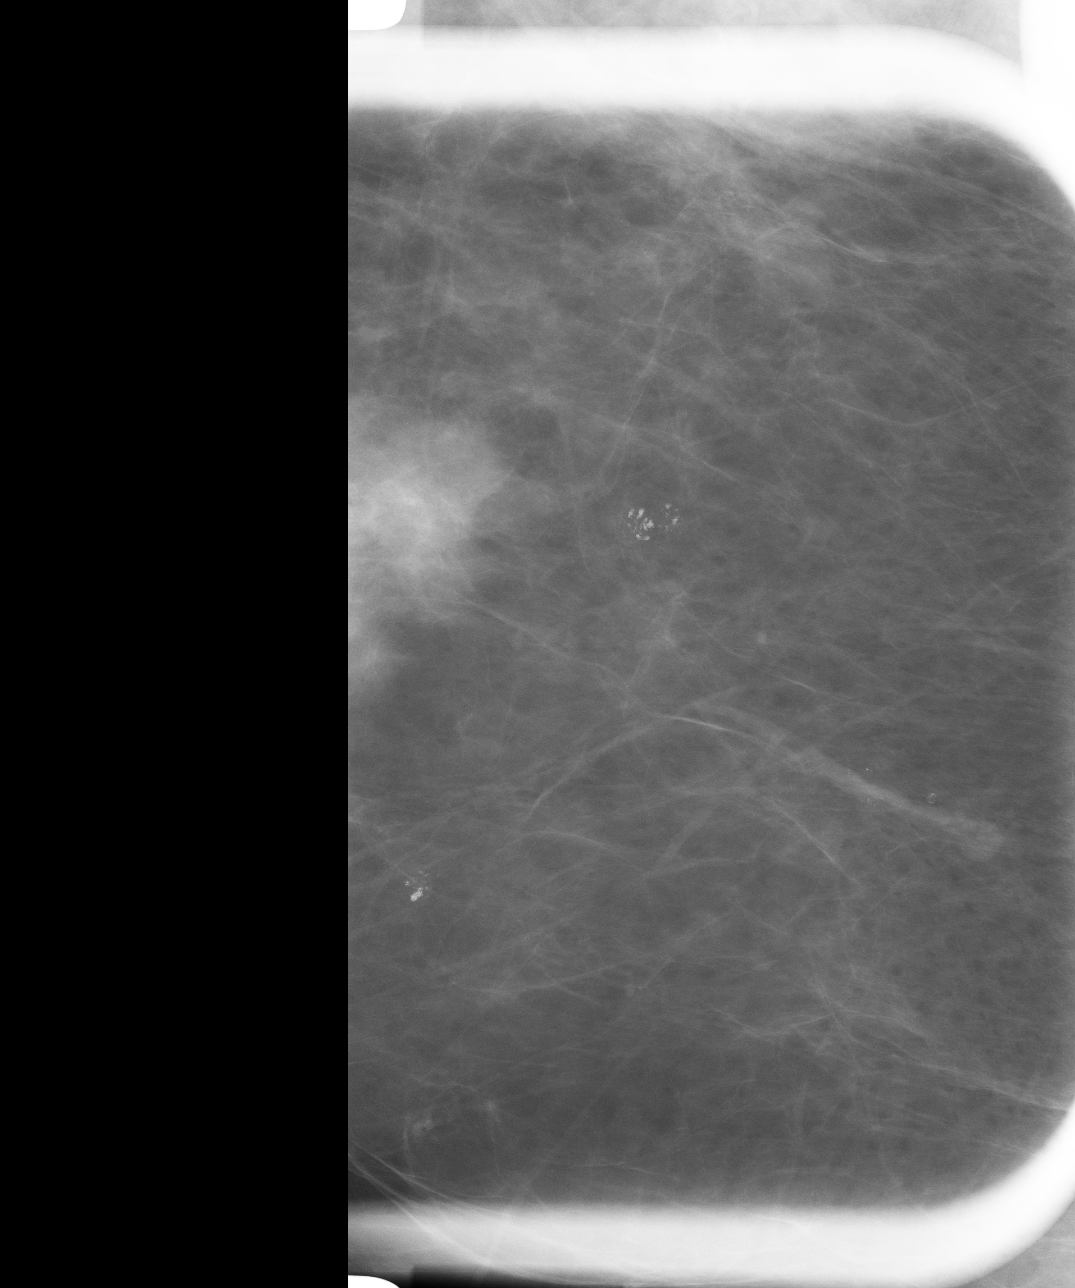
[im 3/4]
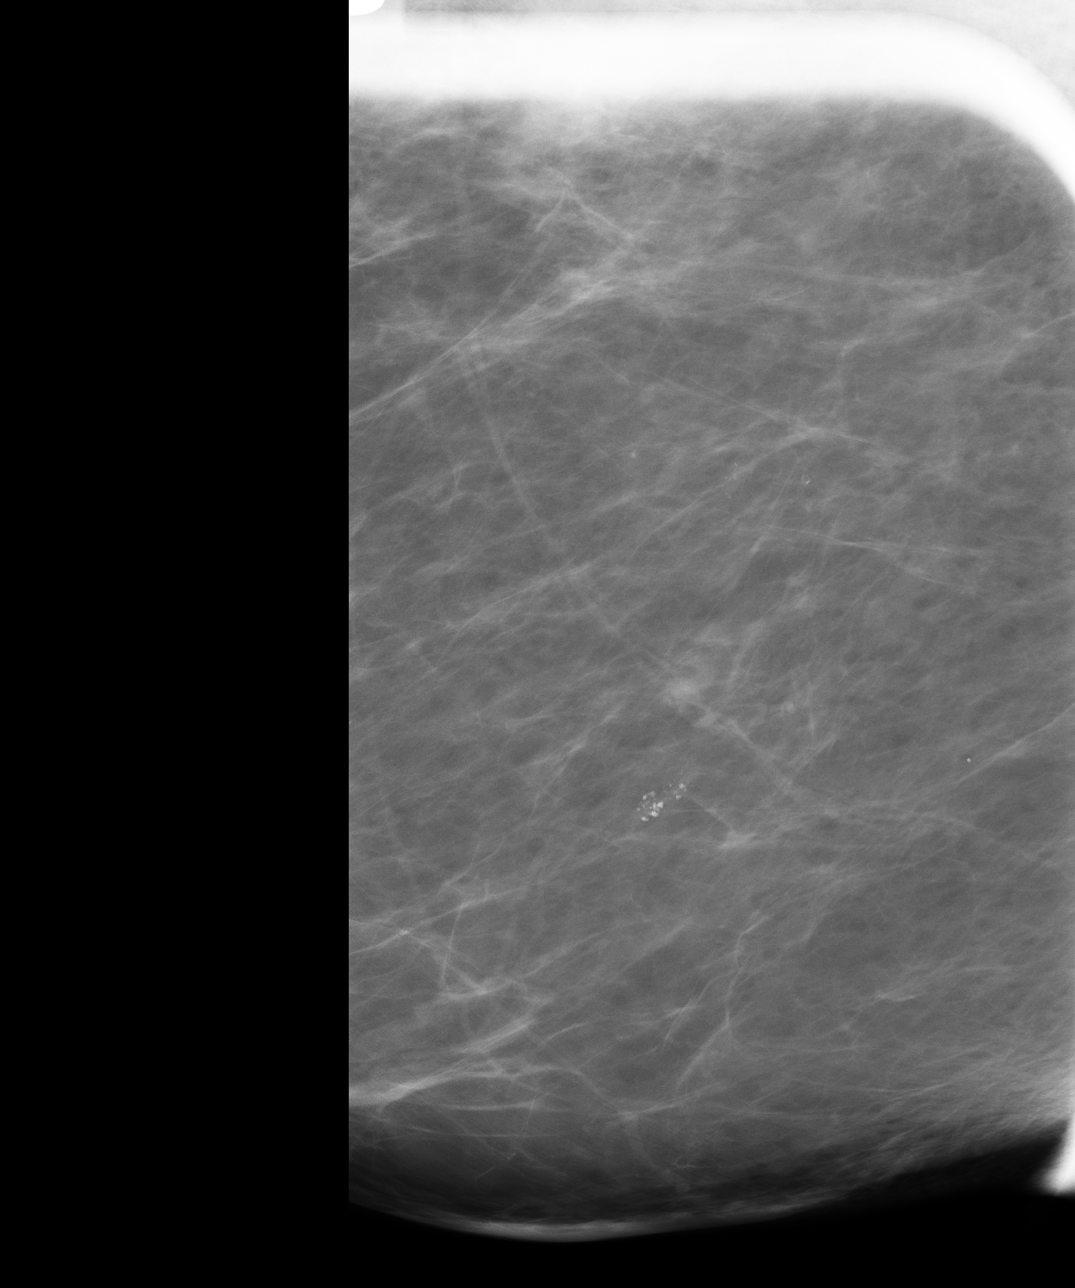
[im 4/4]
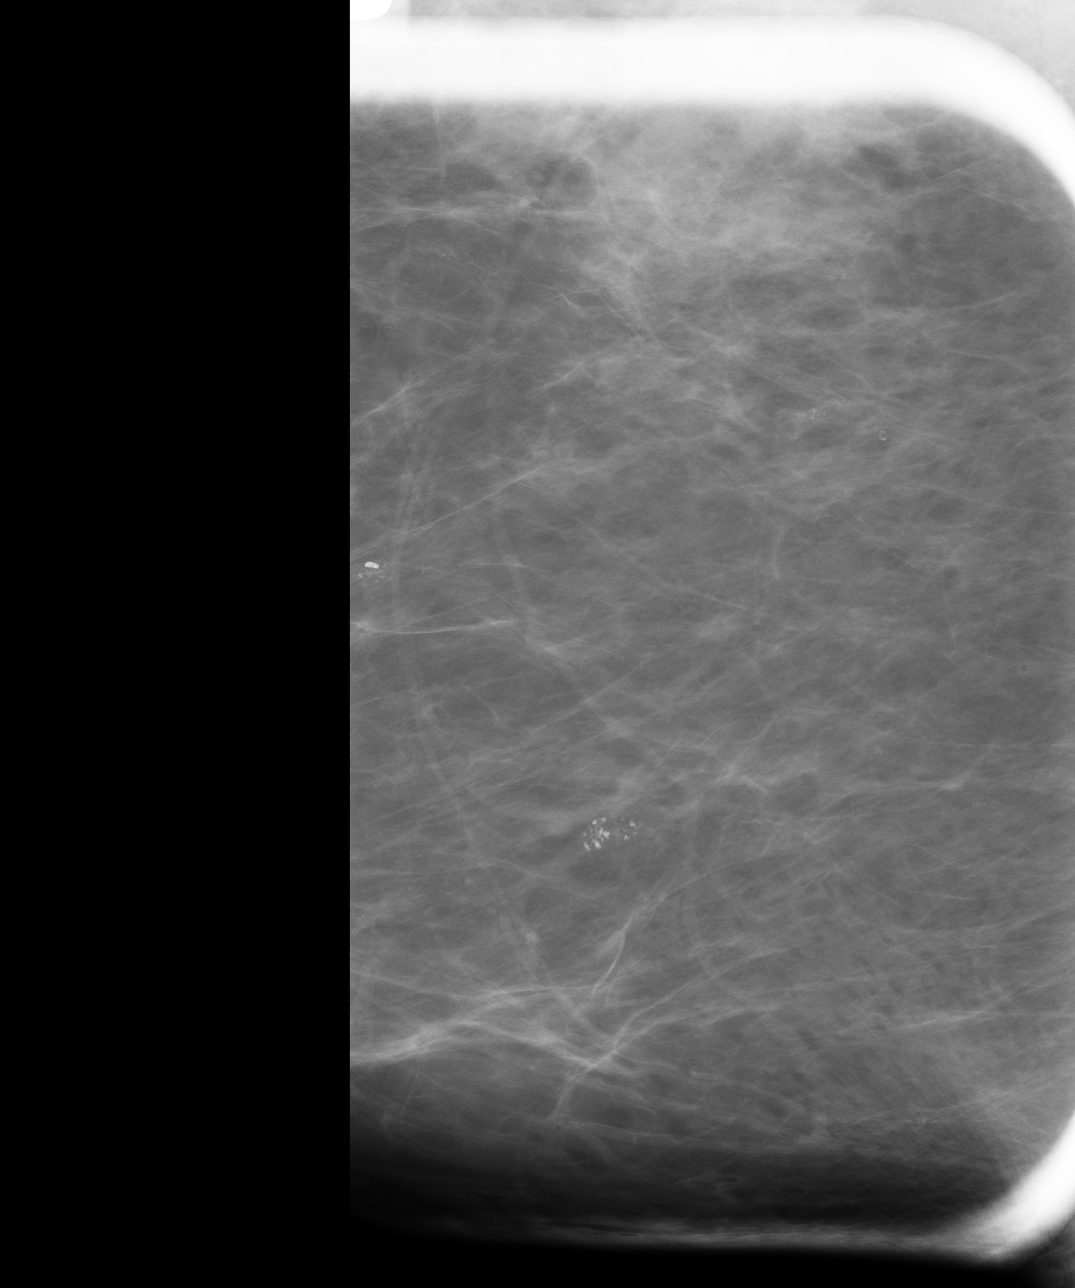

[4 of 4 positions shown; findings below may reference images not displayed]

FINDING: True lateral view and spot magnification views of the right breast were
performed. There is a small persistent cluster of pleomorphic
microcalcifications focally in the right breast in the middle third of the
breast parenchyma located at approximately the [DATE] position. There is a
second cluster of indeterminate microcalcifications in the medial right
breast at approximately the [DATE] position located anterior to the
aforementioned calcifications. There is no architectural distortion.
IMPRESSION: 1.    Two clusters of indeterminate microcalcifications in the right breast
located at the [DATE] position and [DATE] positions. Recommend stereotactic
biopsy.

BI-RADS:  Category 4 - Suspicious Abnormality

A negative mammogram report does not preclude biopsy or other evaluation of
a clinically palpable or otherwise suspicious mass or lesion. Breast cancer
may not be detected by mammography in up to 10% of cases.

[REDACTED]

## 2011-12-20 DIAGNOSIS — R920 Mammographic microcalcification found on diagnostic imaging of breast: Secondary | ICD-10-CM | POA: Insufficient documentation

## 2012-01-31 ENCOUNTER — Ambulatory Visit: Payer: Self-pay | Admitting: Surgery

## 2012-01-31 IMAGING — CR US BIOPSY BREAST CORE VACUUM ASSIST
6 of 8 series · 6 of 8 positions shown · non-contrast
Comparison: none

REASON FOR EXAM: rt brst microcals
COMMENTS:

[ML (1 of 6)]
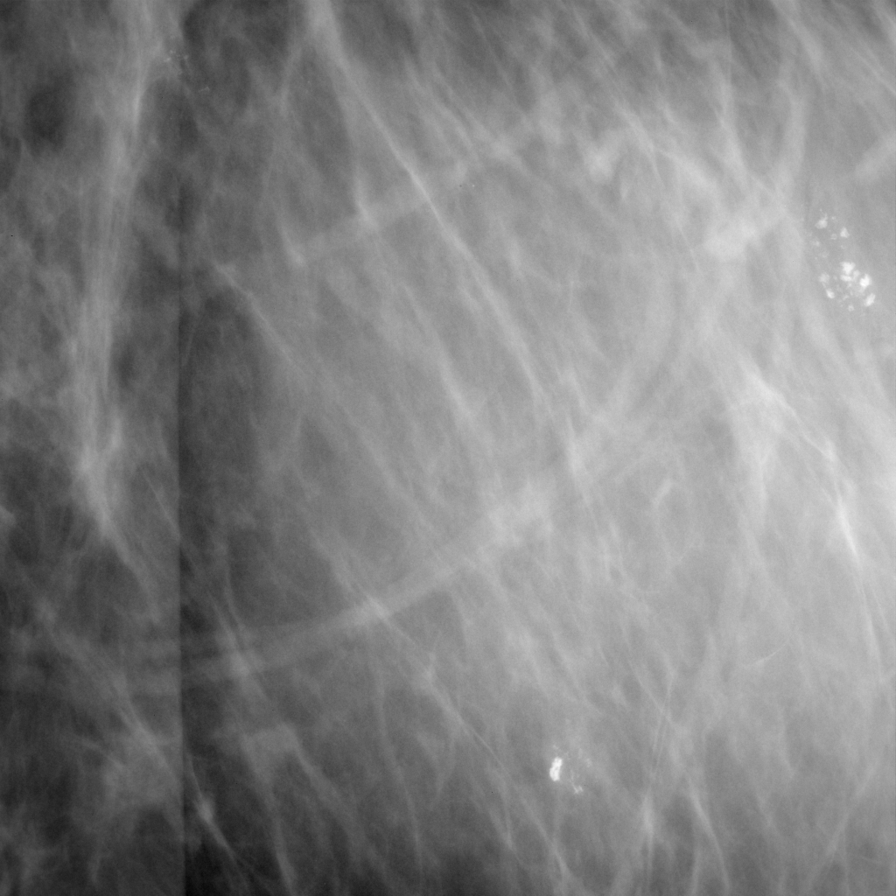

[ML (2 of 6)]
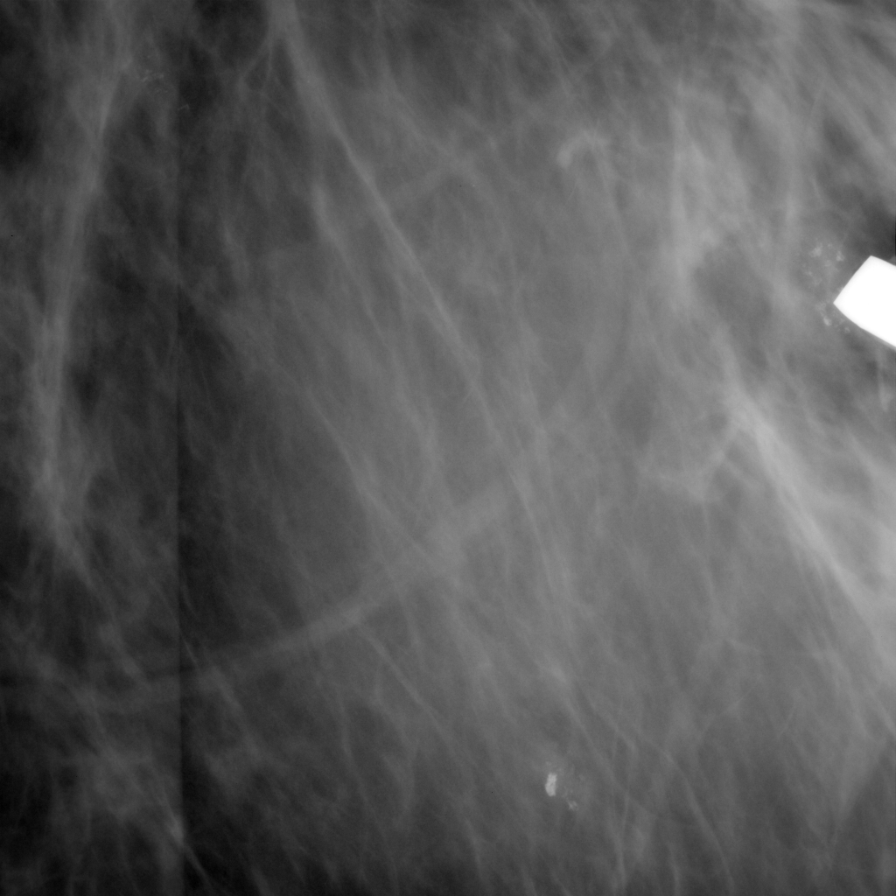

[ML (3 of 6)]
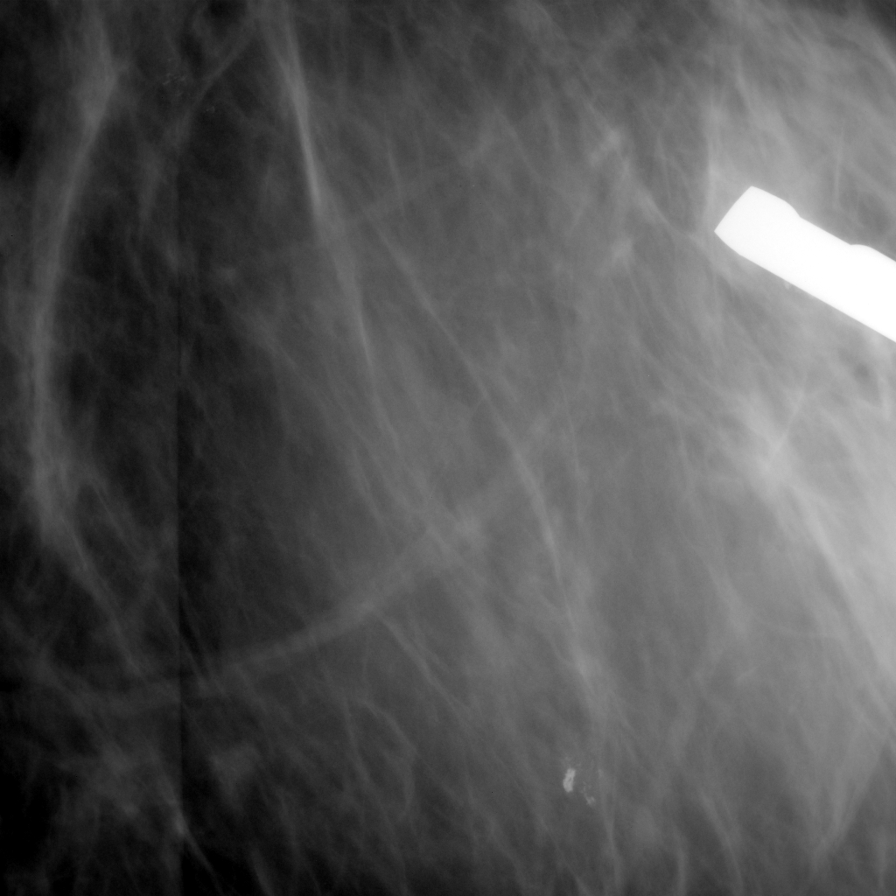

[ML (4 of 6)]
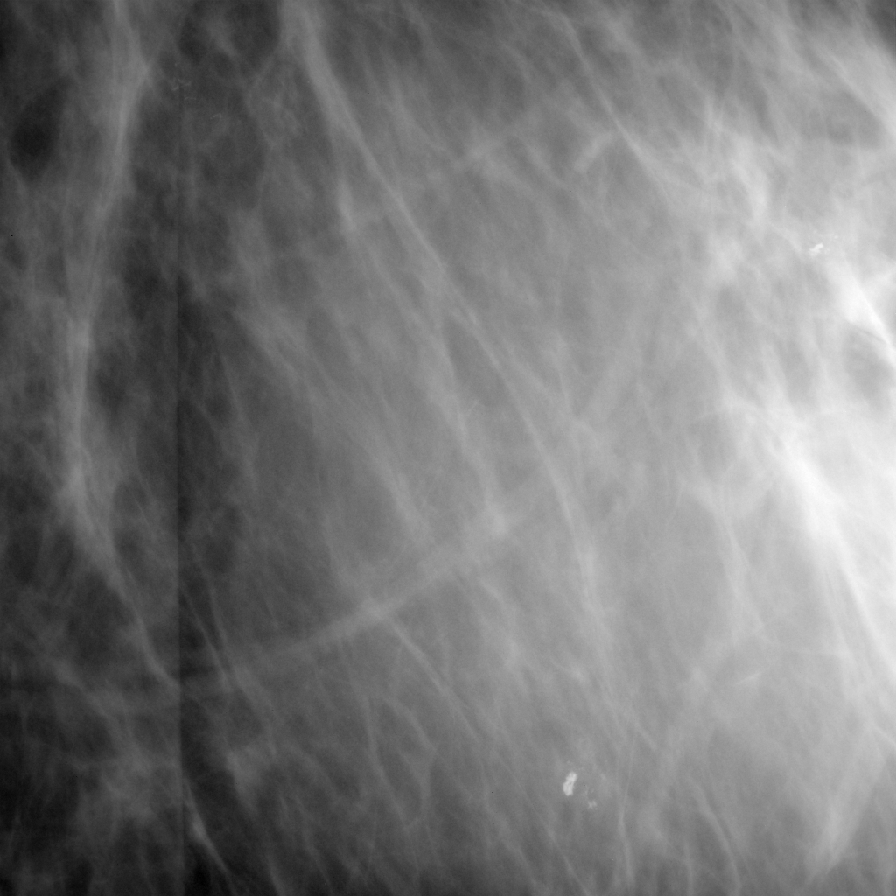

[ML (5 of 6)]
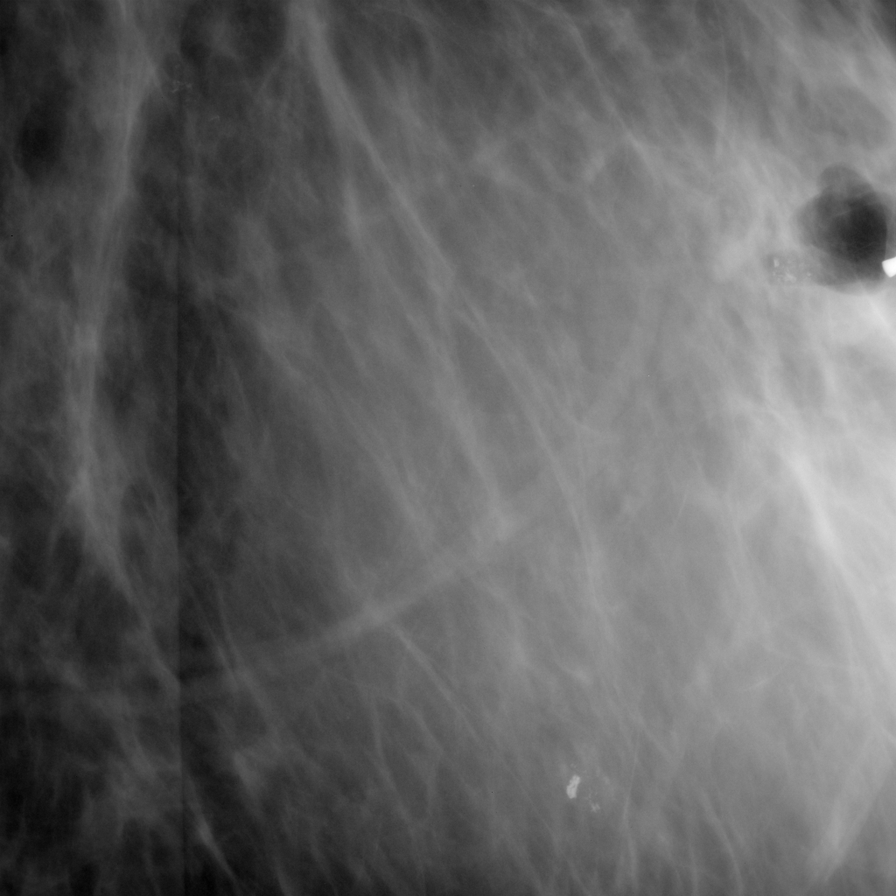

[ML (6 of 6)]
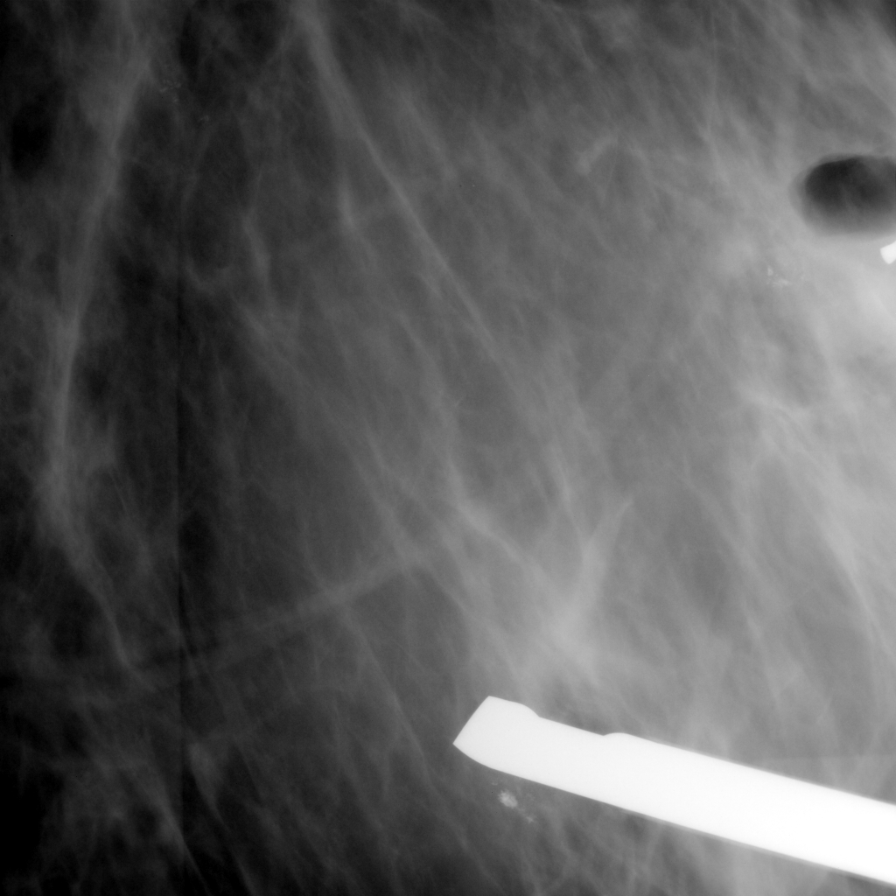

[6 of 8 positions shown; findings below may reference images not displayed]

PROCEDURE:     MAM - MAM STEREOTACTIC VACUUM ASSIST R  - [DATE] [DATE]

RESULT:

Comparisons: [DATE] and [DATE].

Procedure:

The preprocedural mammograms were reviewed. After all risks and benefits
were explained to the patient and all questions were answered, informed
consent was obtained. The patient was placed prone on the stereotactic
biopsy table. The right breast was placed in true lateral compression. The
two clusters of microcalcifications in the right breast were localized.
Attention was first directed to the more posterior cluster of
microcalcifications. The skin was prepped in the usual sterile fashion. A
formal departmental time-out procedure was performed. Local anesthesia was
provided with approximately 5 mL of 1% lidocaine. A dermatotomy was made
with an 11 scalpel blade. The biopsy device was advanced to the site of
microcalcifications. Approximately six core biopsy samples were obtained
with the 9 gauge vacuum assisted biopsy device. Subsequent stereotactic
mammograms demonstrate the majority of the calcifications were no longer
present. A biopsy clip marker was placed at this site. Attention was then
turned to the more anterior cluster of microcalcifications. Local anesthesia
was provided with approximately 5 mL of 1% lidocaine. A dermatotomy was made
with an 11 scalpel blade. The biopsy device was advanced to the site of
microcalcifications. Approximately six core biopsy samples were obtained
with the 9 gauge vacuum assisted core biopsy device. Subsequent stereotactic
mammograms demonstrate the majority of the calcifications were no longer
present. The patient's right breast was taken out of compression and placed
in manual compression. The patient tolerated the procedure well.

The specimen radiograph demonstrated microcalcifications in both biopsy
location samples. The postprocedural CC and true lateral mammograms were
performed. The more posterior biopsy clip is medial to the site of the
calcifications. There are some residual calcifications at the site, but
these are decreased in number from the prior mammograms. The more anterior
biopsy clip marker directly overlies the site of previously demonstrated
calcifications. No residual calcification is seen at this site.
IMPRESSION: Stereotactic guided biopsy of the two clusters of
calcifications in the right breast. Please note, the biopsy clip marker for
the more posterior cluster of microcalcifications is medial to the actual
site of the biopsied calcifications.

## 2012-01-31 IMAGING — CR US BIOPSY BREAST CORE VACUUM ASSIST
1 series · 1 of 1 positions shown · non-contrast
Comparison: none

REASON FOR EXAM: rt brst microcals
COMMENTS:

[ML]
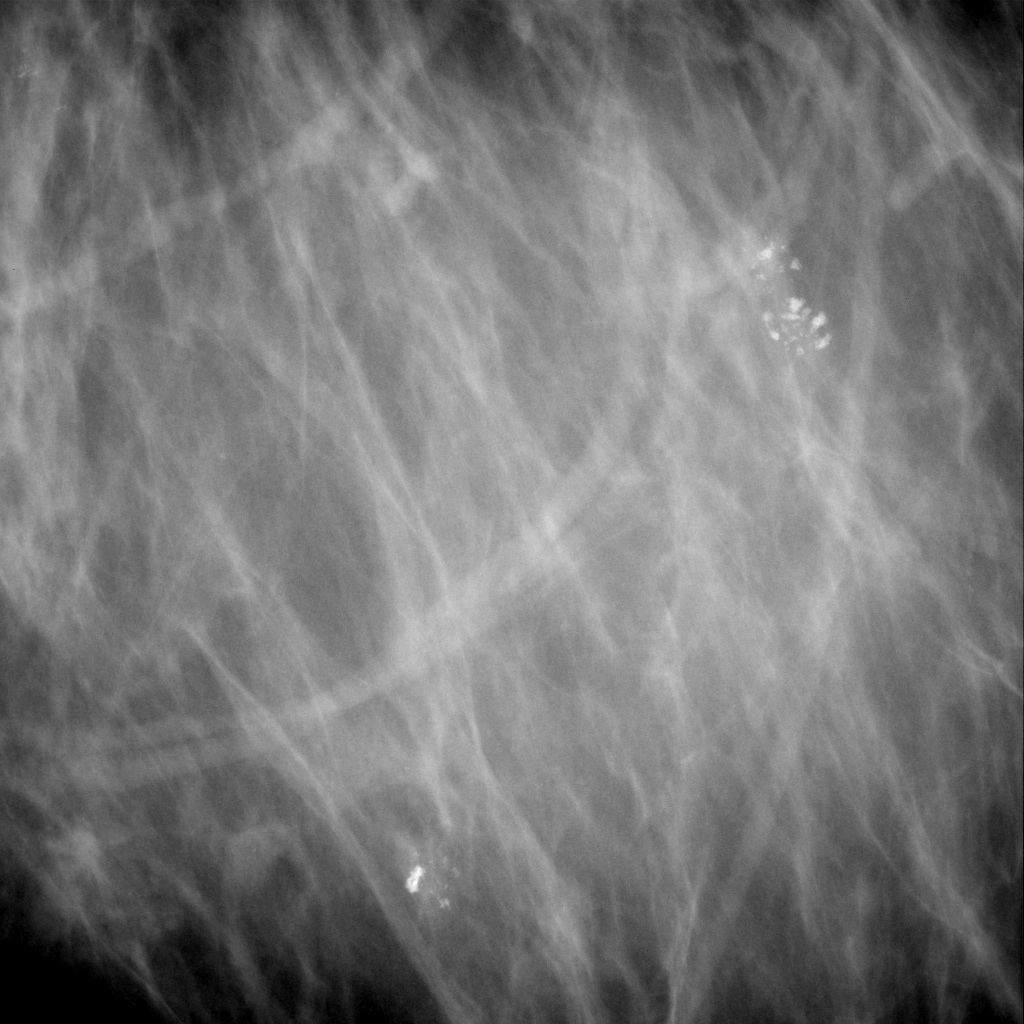

[1 of 1 positions shown; findings below may reference images not displayed]

PROCEDURE:     MAM - MAM STEREOTACTIC VACUUM ASSIST R  - [DATE] [DATE]

RESULT:

Comparisons: [DATE] and [DATE].

Procedure:

The preprocedural mammograms were reviewed. After all risks and benefits
were explained to the patient and all questions were answered, informed
consent was obtained. The patient was placed prone on the stereotactic
biopsy table. The right breast was placed in true lateral compression. The
two clusters of microcalcifications in the right breast were localized.
Attention was first directed to the more posterior cluster of
microcalcifications. The skin was prepped in the usual sterile fashion. A
formal departmental time-out procedure was performed. Local anesthesia was
provided with approximately 5 mL of 1% lidocaine. A dermatotomy was made
with an 11 scalpel blade. The biopsy device was advanced to the site of
microcalcifications. Approximately six core biopsy samples were obtained
with the 9 gauge vacuum assisted biopsy device. Subsequent stereotactic
mammograms demonstrate the majority of the calcifications were no longer
present. A biopsy clip marker was placed at this site. Attention was then
turned to the more anterior cluster of microcalcifications. Local anesthesia
was provided with approximately 5 mL of 1% lidocaine. A dermatotomy was made
with an 11 scalpel blade. The biopsy device was advanced to the site of
microcalcifications. Approximately six core biopsy samples were obtained
with the 9 gauge vacuum assisted core biopsy device. Subsequent stereotactic
mammograms demonstrate the majority of the calcifications were no longer
present. The patient's right breast was taken out of compression and placed
in manual compression. The patient tolerated the procedure well.

The specimen radiograph demonstrated microcalcifications in both biopsy
location samples. The postprocedural CC and true lateral mammograms were
performed. The more posterior biopsy clip is medial to the site of the
calcifications. There are some residual calcifications at the site, but
these are decreased in number from the prior mammograms. The more anterior
biopsy clip marker directly overlies the site of previously demonstrated
calcifications. No residual calcification is seen at this site.
IMPRESSION: Stereotactic guided biopsy of the two clusters of
calcifications in the right breast. Please note, the biopsy clip marker for
the more posterior cluster of microcalcifications is medial to the actual
site of the biopsied calcifications.

## 2012-07-18 ENCOUNTER — Ambulatory Visit: Payer: Self-pay | Admitting: Surgery

## 2012-07-18 IMAGING — MG MM MAMMO DIAGNOSTIC UNILATERAL*R*
1 series · 4 of 4 positions shown · non-contrast
Comparison: none

REASON FOR EXAM: RT BRST STEREO MICROCALS FU
COMMENTS:

PROCEDURE:     MAM - MAM DGTL UNI MAM RT BREAST W/CAD  - [DATE]  [DATE]
RESULT:     COMPARISON:  [DATE], [DATE], [DATE]
TECHNIQUE: Digital diagnostic right mammograms were obtained. FDA approved
computer-aided detection (CAD) for mammography was utilized for this study.

[R CC · right · 4 of 4 slices shown]
[im 1/4]
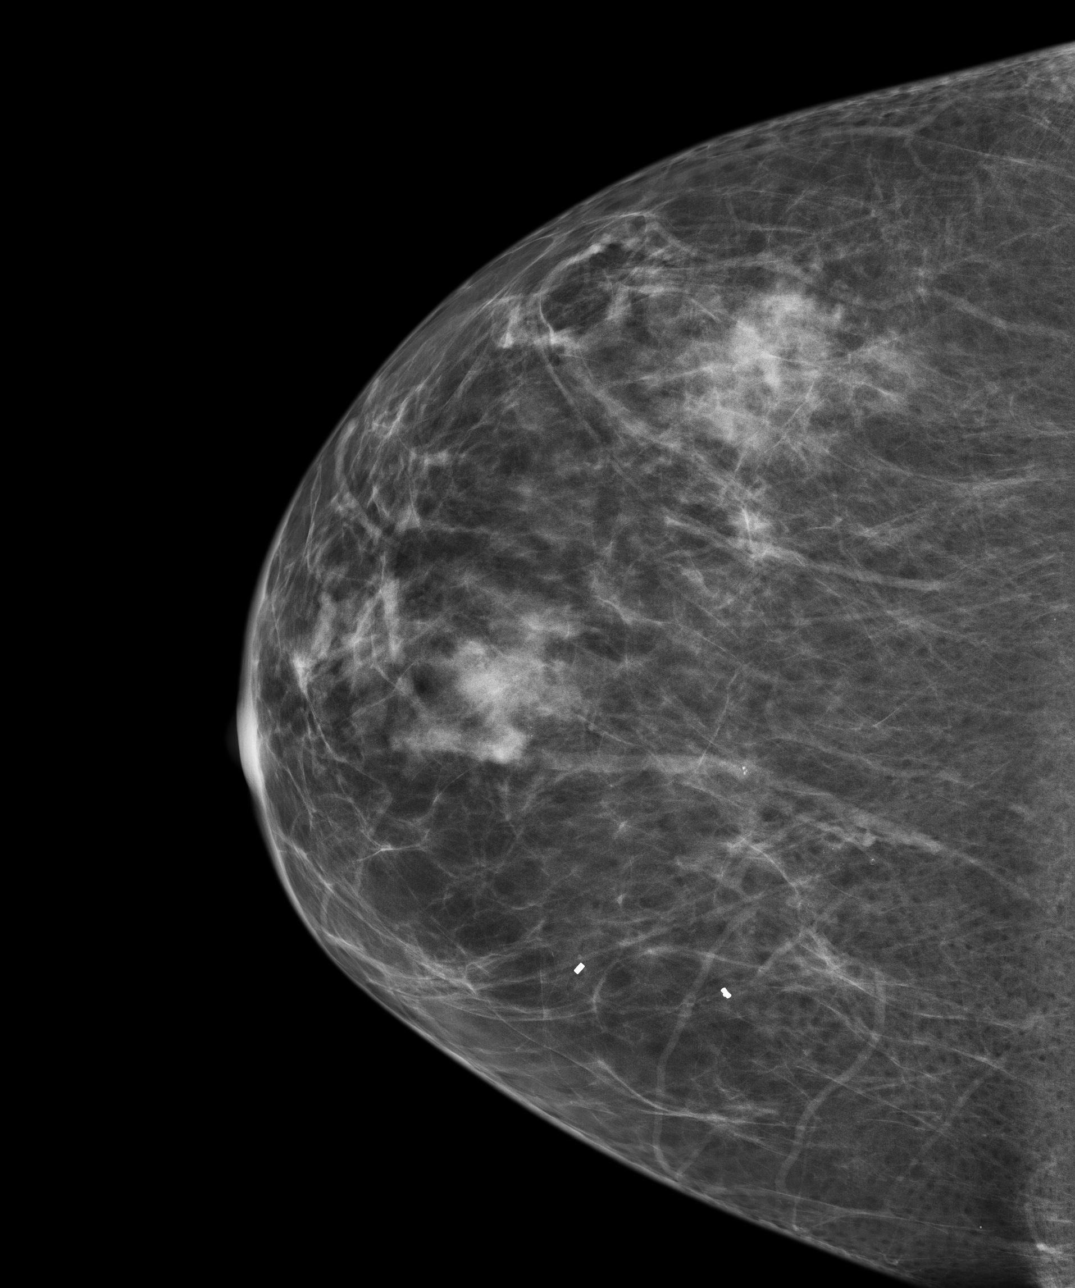
[im 2/4]
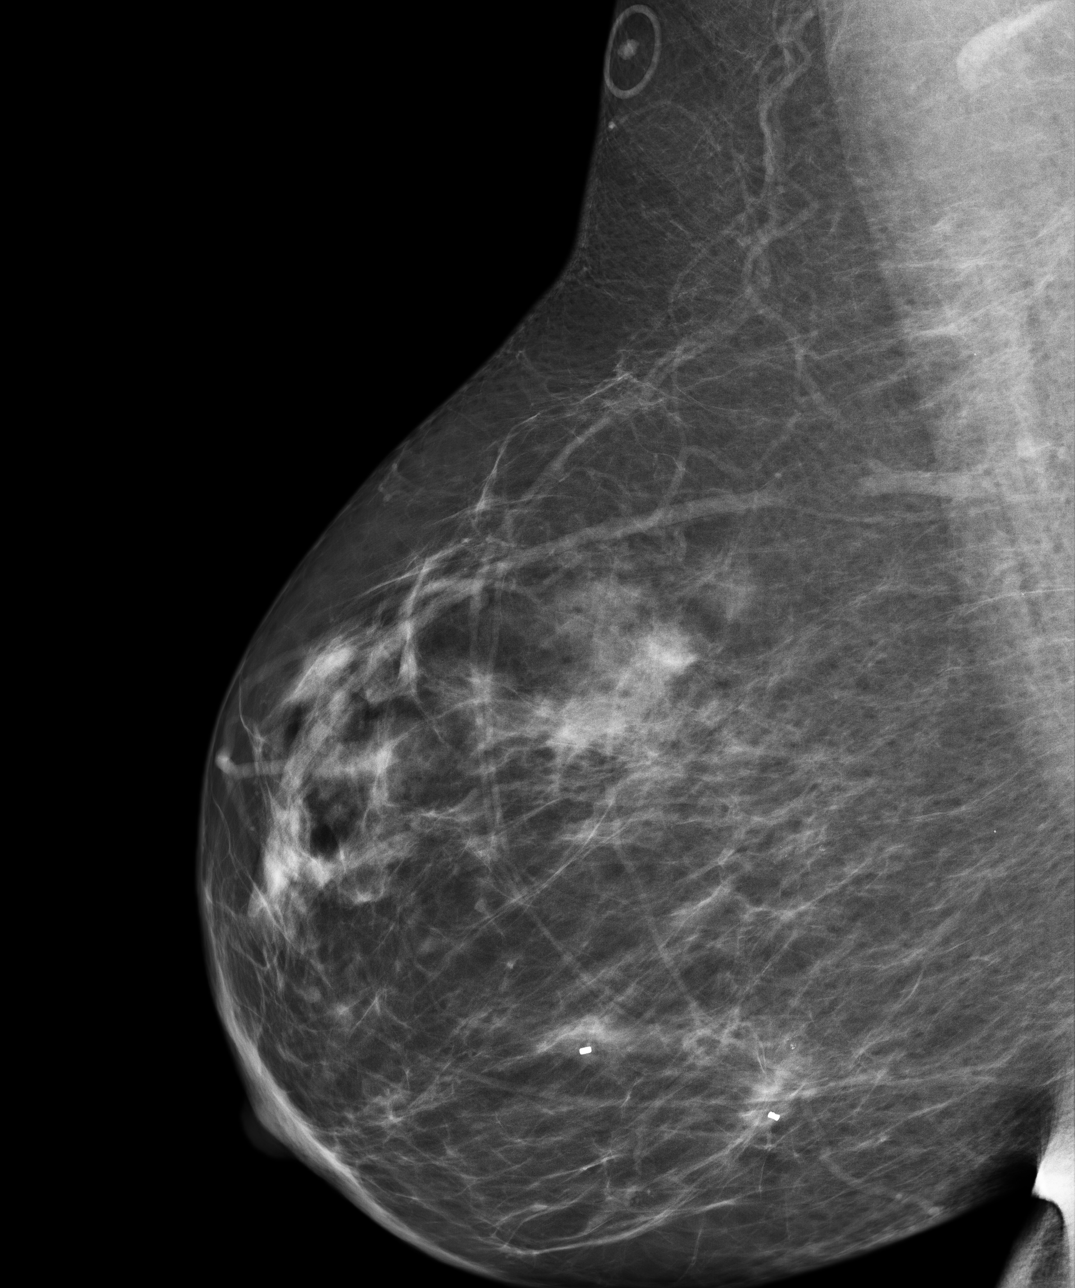
[im 3/4]
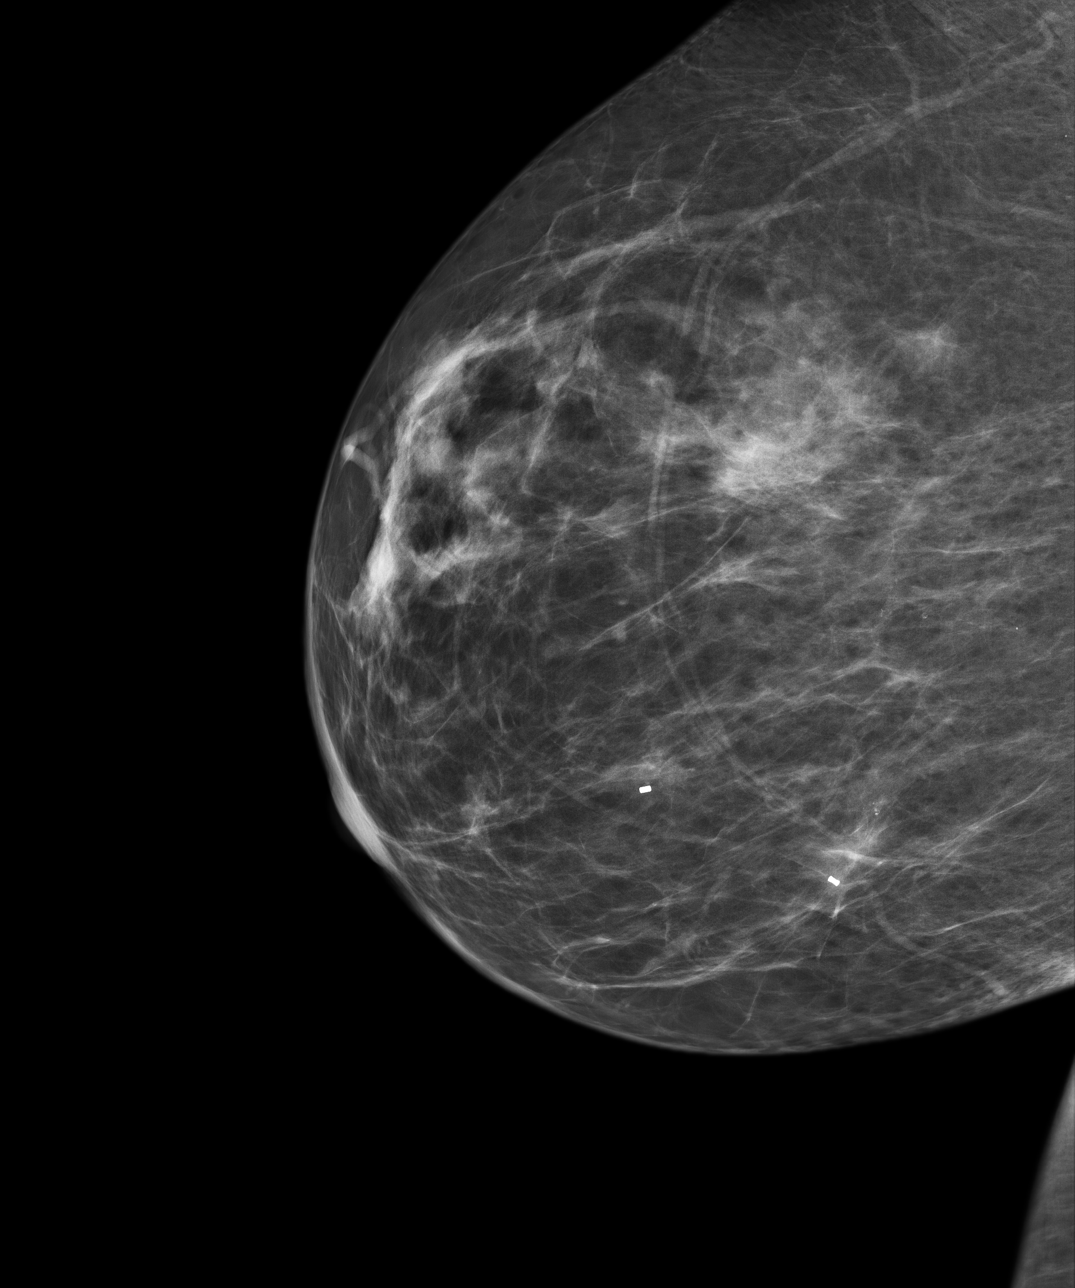
[im 4/4]
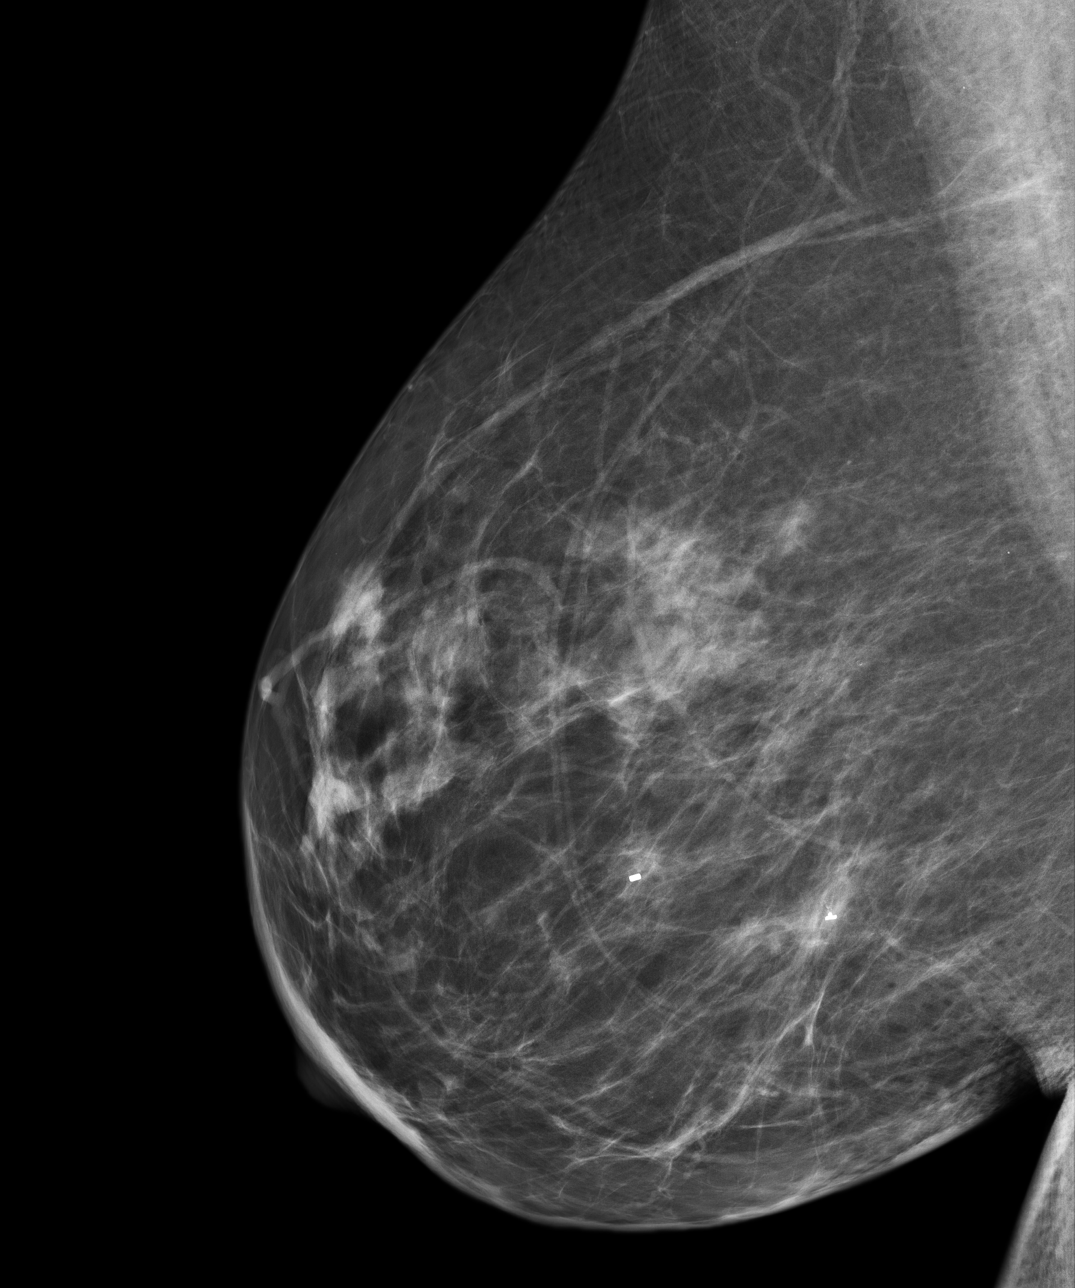

[4 of 4 positions shown; findings below may reference images not displayed]

FINDING: The right breast demonstrates  a scattered fibroglandular density.  There
are 2 stereotactic biopsy clips in the inferior medial right breast at the
site of previous demonstrated microcalcifications. The more anterior clip
Site demonstrates no residual calcifications. The more posterior a
stereotactic clip demonstrates a few punctate calcifications which has
significantly decreased compared with the prior examination consistent with
stereotactic biopsy. Both biopsy sites demonstrated benign pathology.There
is no new dominant mass, architectural distortion or clusters of suspicious
microcalcifications.
IMPRESSION: 1.     Stable post biopsy right mammogram.
2.     Annual mammographic follow up recommended.
3.     BI-RADS:  Category 2- Benign.

A negative mammogram report does not preclude biopsy or other evaluation of
a clinically palpable or otherwise suspicious mass or lesion. Breast cancer
may not be detected by mammography in up to 10% of cases.

[REDACTED]

## 2012-12-29 ENCOUNTER — Ambulatory Visit: Payer: Self-pay | Admitting: Internal Medicine

## 2013-01-07 ENCOUNTER — Ambulatory Visit: Payer: Self-pay | Admitting: Internal Medicine

## 2013-10-18 ENCOUNTER — Encounter: Payer: Self-pay | Admitting: Adult Health

## 2013-10-18 ENCOUNTER — Ambulatory Visit (INDEPENDENT_AMBULATORY_CARE_PROVIDER_SITE_OTHER): Payer: BC Managed Care – PPO | Admitting: Adult Health

## 2013-10-18 ENCOUNTER — Encounter (INDEPENDENT_AMBULATORY_CARE_PROVIDER_SITE_OTHER): Payer: Self-pay

## 2013-10-18 VITALS — BP 116/72 | HR 63 | Temp 98.0°F | Resp 14 | Ht 63.25 in | Wt 194.0 lb

## 2013-10-18 DIAGNOSIS — E119 Type 2 diabetes mellitus without complications: Secondary | ICD-10-CM

## 2013-10-18 DIAGNOSIS — K760 Fatty (change of) liver, not elsewhere classified: Secondary | ICD-10-CM

## 2013-10-18 DIAGNOSIS — K7689 Other specified diseases of liver: Secondary | ICD-10-CM

## 2013-10-18 DIAGNOSIS — E118 Type 2 diabetes mellitus with unspecified complications: Secondary | ICD-10-CM | POA: Insufficient documentation

## 2013-10-18 DIAGNOSIS — Z09 Encounter for follow-up examination after completed treatment for conditions other than malignant neoplasm: Secondary | ICD-10-CM | POA: Insufficient documentation

## 2013-10-18 LAB — HEPATIC FUNCTION PANEL
ALK PHOS: 59 U/L (ref 39–117)
ALT: 24 U/L (ref 0–35)
AST: 20 U/L (ref 0–37)
Albumin: 3.9 g/dL (ref 3.5–5.2)
BILIRUBIN TOTAL: 0.4 mg/dL (ref 0.3–1.2)
Bilirubin, Direct: 0 mg/dL (ref 0.0–0.3)
TOTAL PROTEIN: 6.8 g/dL (ref 6.0–8.3)

## 2013-10-18 LAB — HEMOGLOBIN A1C: Hgb A1c MFr Bld: 6.4 % (ref 4.6–6.5)

## 2013-10-18 MED ORDER — CYCLOBENZAPRINE HCL 5 MG PO TABS: 5.0000 mg | ORAL_TABLET | Freq: Three times a day (TID) | ORAL | Status: DC | PRN

## 2013-10-18 MED ORDER — PREDNISONE 10 MG PO TABS: ORAL_TABLET | ORAL | Status: DC

## 2013-10-18 NOTE — Progress Notes (Signed)
Pre visit review using our clinic review tool, if applicable. No additional management support is needed unless otherwise documented below in the visit note. 

## 2013-10-18 NOTE — Progress Notes (Signed)
Patient ID: Kathy Bryant, female   DOB: 1946/01/16, 68 y.o.   MRN: [id number]    Subjective:    Patient ID: Kathy Bryant, female    DOB: 1946-07-16, 68 y.o.   MRN: [id number]  HPI  Pt is a 68 y/o female who presents to clinic to establish care. She was previously followed by Dr. Heber Hico. I will request records. She reports having a right breast biopsy in May 2014. She did not return for her follow up mammogram.   She is experiencing low back pain which began in January. She has radiating pain down the right buttocks. She has been taking ibuprofen for her pain. She reports worsening symptoms while sitting. She has had sciatic pain in the past but not as severe as this.  Reports hx of microscopic hematuria with w/u by Urology. ?[facility]. She is unclear as to the w/u. Family hx of renal cell carcinoma (multiple family members). Will request records. Also reports hx of fatty liver. Uncertain if she had an ultrasound    Past Medical History  Diagnosis Date  . Diabetes mellitus without complication   . Herniated disc, cervical   . Fatty liver   . Microscopic hematuria      Past Surgical History  Procedure Laterality Date  . Gallbladder surgery  1982  . Appendectomy  1968  . Total abdominal hysterectomy w/ bilateral salpingoophorectomy  1998  . Cesarean section  1972 & 1978  . Hernia repair      Abdominal  . Breast biopsy Right 2014    benign     Family History  Problem Relation Age of Onset  . Diabetes Mother   . Cancer Mother 26    Brain Tumor - died age 52  . Heart disease Father     CAD - died MI - age 71  . Hypertension Father   . Diabetes Sister   . Diabetes Brother   . Diabetes Brother   . Cancer Paternal Aunt     Renal Cell Ca  . Cancer Paternal Uncle     Renal Cell Ca     History   Social History  . Marital Status: Married    Spouse Name: N/A    Number of Children: 2  . Years of Education: 13   Occupational History  . Clinical research associate    Social History Main Topics  . Smoking status: Never Smoker   . Smokeless tobacco: Not on file  . Alcohol Use: Yes     Comment: Occasional glass of wine  . Drug Use: No  . Sexual Activity: Not on file   Other Topics Concern  . Not on file   Social History Narrative  . No narrative on file     Review of Systems  Constitutional: Negative.   HENT: Negative.   Eyes: Negative.   Respiratory: Negative.   Cardiovascular: Negative.   Gastrointestinal: Negative.   Endocrine: Negative.   Genitourinary: Negative.   Musculoskeletal: Positive for back pain (radiating down both buttock and legs).  Skin: Negative.   Allergic/Immunologic: Negative.   Neurological: Negative.   Hematological: Negative.   Psychiatric/Behavioral: Negative.        Objective:  BP 116/72  Pulse 63  Temp(Src) 98 F (36.7 C) (Oral)  Ht 5' 3.25" (1.607 m)  Wt 194 lb (87.998 kg)  BMI 34.08 kg/m2  SpO2 94%   Physical Exam  Constitutional: She is oriented to person, place, and time. She appears well-developed and well-nourished. No  distress.  HENT:  Head: Normocephalic and atraumatic.  Right Ear: External ear normal.  Left Ear: External ear normal.  Nose: Nose normal.  Mouth/Throat: Oropharynx is clear and moist.  Eyes: Conjunctivae and EOM are normal. Pupils are equal, round, and reactive to light.  Neck: Normal range of motion. Neck supple. No tracheal deviation present. No thyromegaly present.  Cardiovascular: Normal rate, regular rhythm, normal heart sounds and intact distal pulses.  Exam reveals no gallop and no friction rub.   No murmur heard. Pulmonary/Chest: Effort normal and breath sounds normal. No respiratory distress. She has no wheezes. She has no rales.  Abdominal: Soft. Bowel sounds are normal. She exhibits no distension and no mass. There is no tenderness. There is no rebound and no guarding.  Musculoskeletal: Normal range of motion. She exhibits no edema and no tenderness.   Lymphadenopathy:    She has no cervical adenopathy.  Neurological: She is alert and oriented to person, place, and time. She has normal reflexes. No cranial nerve deficit. Coordination normal.  Skin: Skin is warm and dry.  Psychiatric: She has a normal mood and affect. Her behavior is normal. Judgment and thought content normal.      Assessment & Plan:   1. Follow up Pt had a breast biopsy in May 2014 and was to have a 6 month f/u mammogram. She has not followed up. Will send for diagnostic mammogram at [facility].  - MM Digital Diagnostic Bilat; Future  2. DM (diabetes mellitus) On metformin 500 mg bid. Check A1c. Continue to follow  - Hemoglobin A1c  3. Fatty liver Hx of fatty liver. Check hepatic panel. If pt has not had ultrasound will send to have one done. Request records. - Hepatic function panel

## 2013-10-18 NOTE — Patient Instructions (Signed)
   Thank you for choosing [place] [facility] for your health care needs.  Please have your labs drawn prior to leaving the office.  The results will be available through MyChart for your convenience. Please remember to activate this. The activation code is located at the end of this form.  Start prednisone taper as follows:  Day #1 - take 6 tablets Day #2 - take 5 tablets Day #3 - take 4 tablets Day #4 - take 3 tablets Day #5 - take 2 tablets Day #6 - take 1 tablet  Also take flexeril for muscle spasms. This will make you sleepy so only take this at bedtime when you are working.  Apply ice alternating with heat to the affected areas for 15 min at a time. Do this approximately 3-4 times daily.  Use a firm pillow between your knees when you lie on your side or under your knees when you lie on your back.  Return to clinic if your symptoms are not improving within 4 weeks or sooner if your symptoms worsen.  The office will call you with an appointment for your follow up mammogram.

## 2013-10-22 ENCOUNTER — Encounter: Payer: Self-pay | Admitting: *Deleted

## 2013-10-25 ENCOUNTER — Telehealth: Payer: Self-pay

## 2013-10-25 NOTE — Telephone Encounter (Signed)
Relevant patient education assigned to patient using Emmi. ° °

## 2013-10-29 ENCOUNTER — Ambulatory Visit: Payer: Self-pay | Admitting: Adult Health

## 2013-10-29 IMAGING — MG MM DIGITAL SCREENING BILAT W/ CAD
1 series · 4 of 4 positions shown · non-contrast
Comparison: Previous exam(s).

CLINICAL DATA: Screening.

EXAM:
DIGITAL SCREENING BILATERAL MAMMOGRAM WITH CAD

[R CC · right · 4 of 4 slices shown]
[im 1/4]
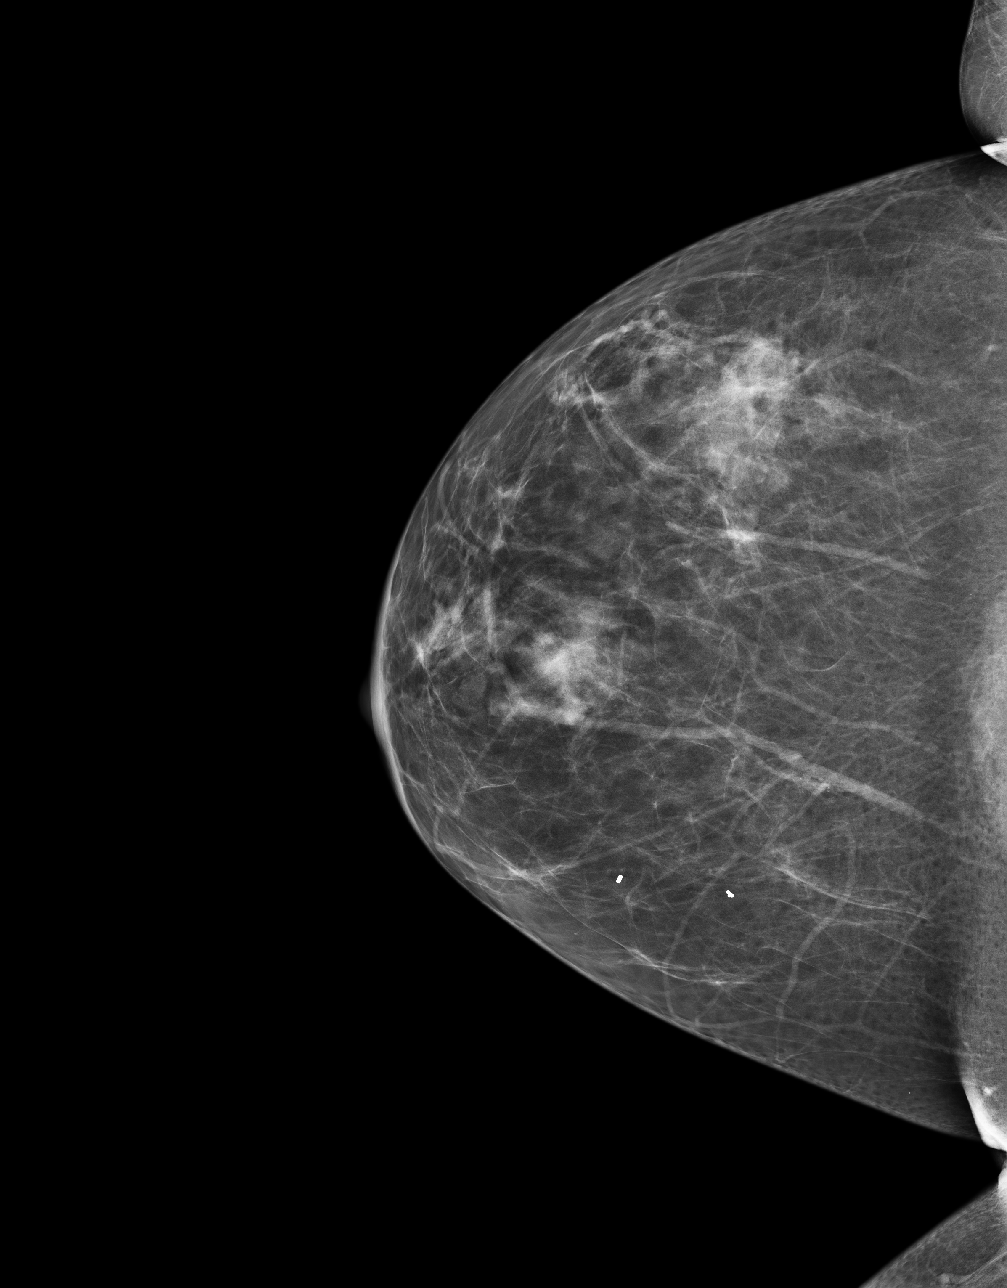
[im 2/4]
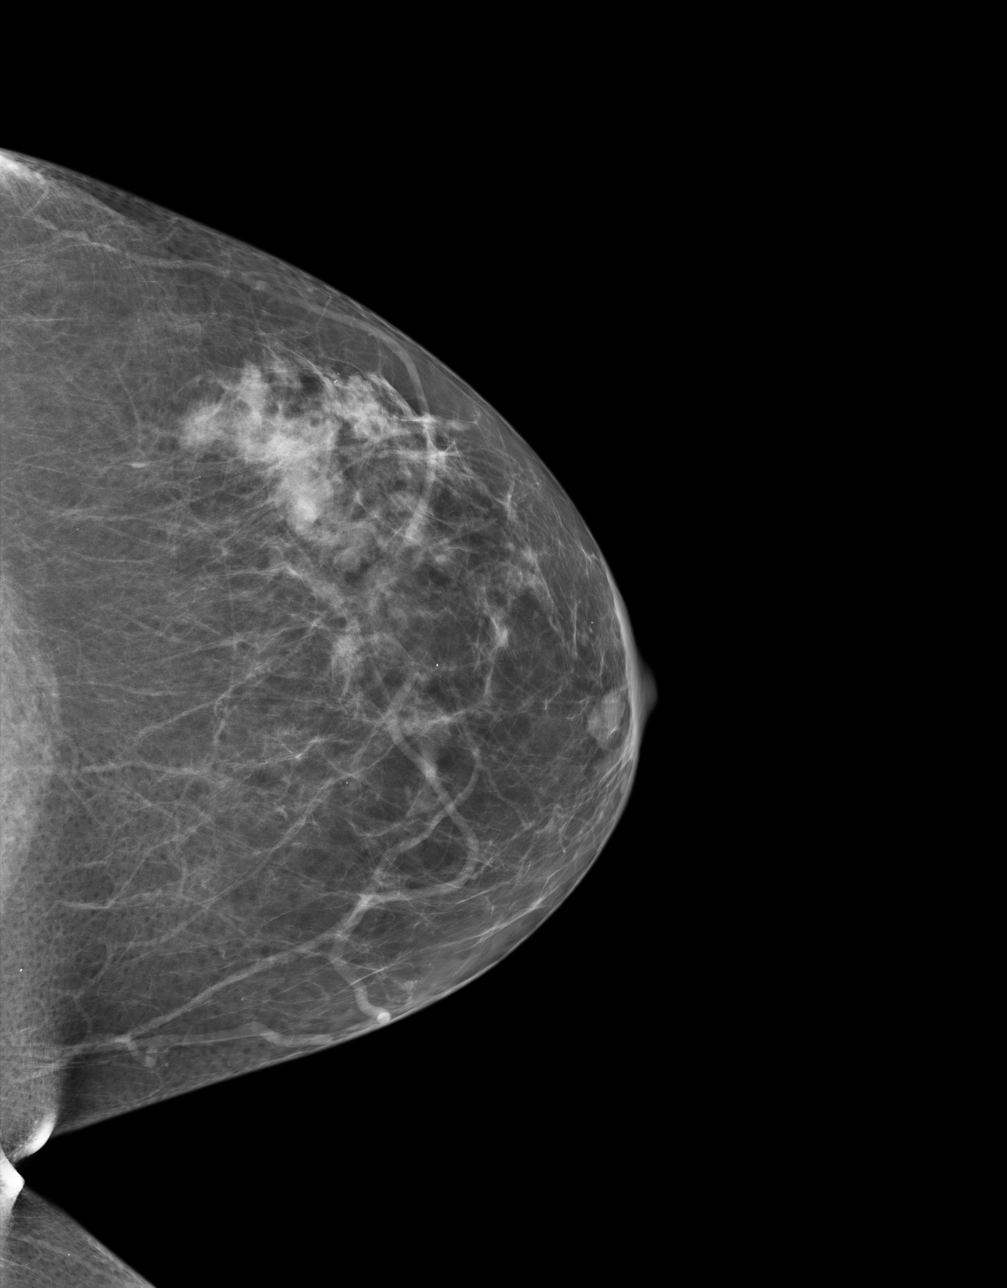
[im 3/4]
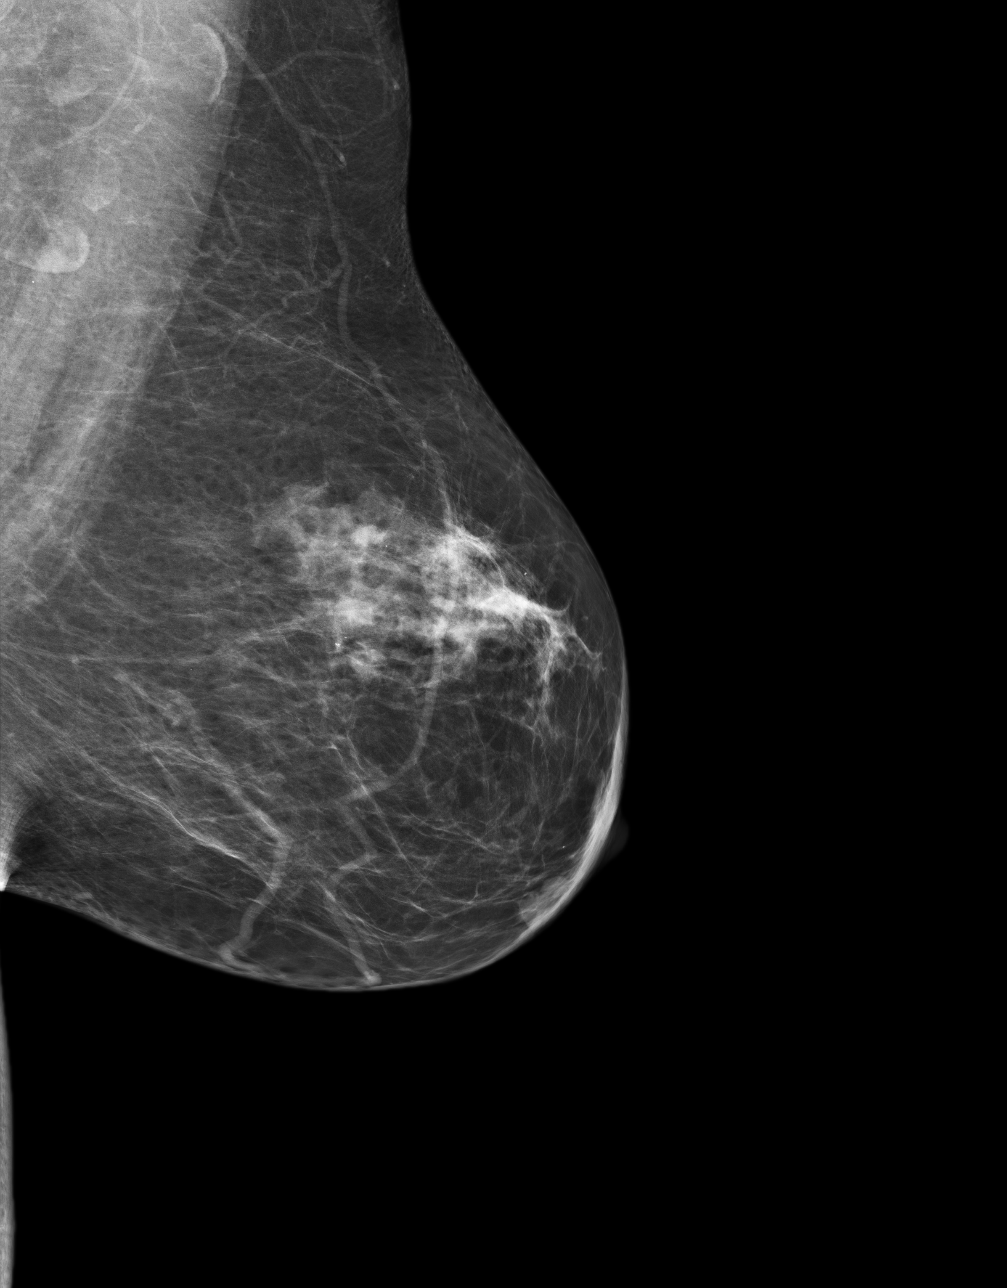
[im 4/4]
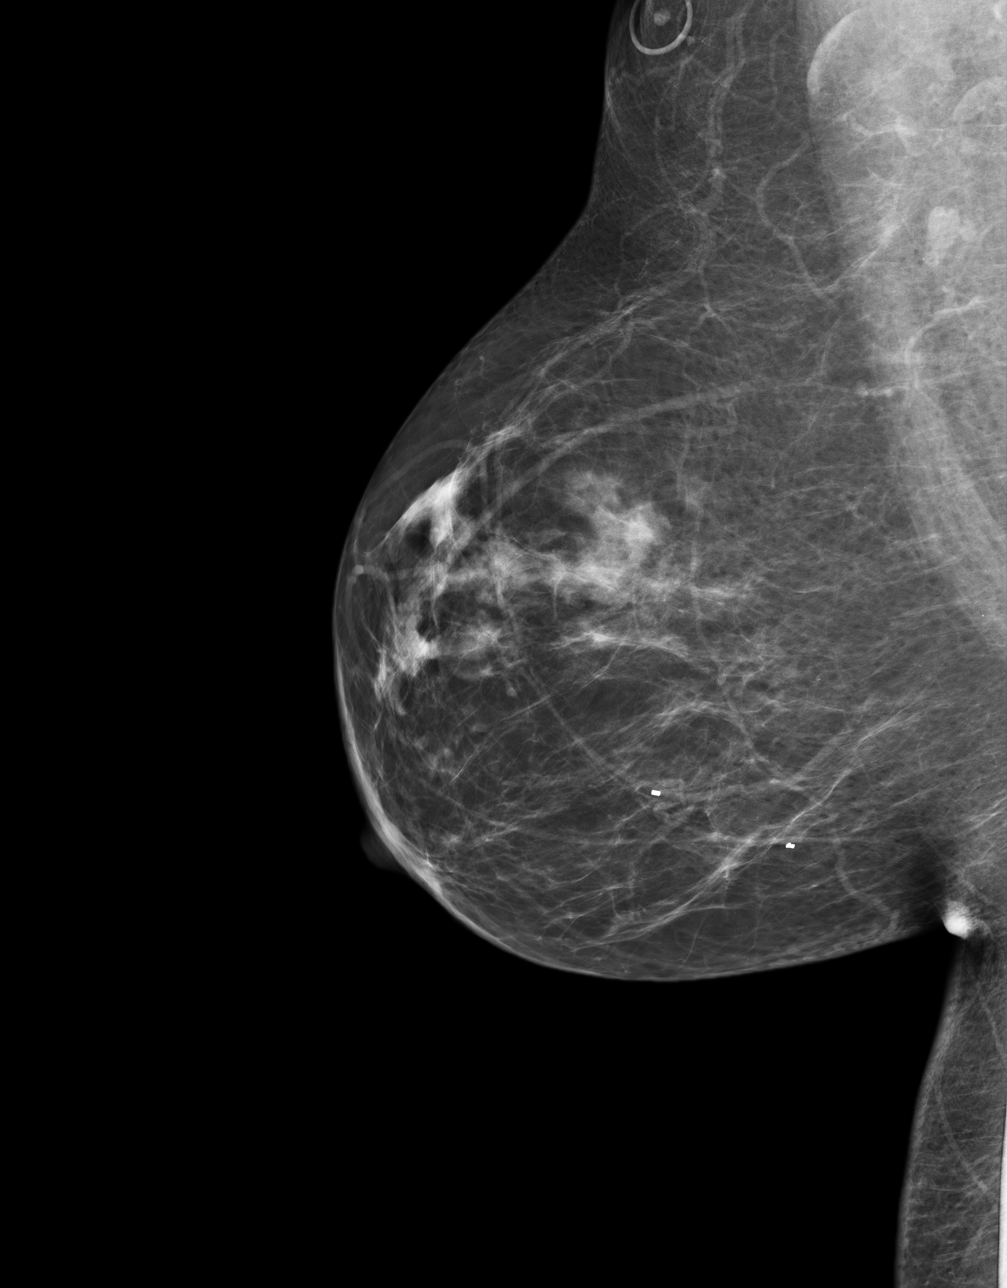

[4 of 4 positions shown; findings below may reference images not displayed]

ACR Breast Density Category b: There are scattered areas of
fibroglandular density.
FINDINGS: There are no findings suspicious for malignancy. Images were
processed with CAD.
IMPRESSION: No mammographic evidence of malignancy. A result letter of this
screening mammogram will be mailed directly to the patient.

RECOMMENDATION:
Screening mammogram in one year. (Code:[US])

BI-RADS CATEGORY  1: Negative.

## 2013-10-31 ENCOUNTER — Other Ambulatory Visit: Payer: Self-pay | Admitting: *Deleted

## 2013-10-31 MED ORDER — LISINOPRIL 2.5 MG PO TABS: 2.5000 mg | ORAL_TABLET | Freq: Once | ORAL | Status: DC

## 2013-11-30 ENCOUNTER — Encounter: Payer: Self-pay | Admitting: Adult Health

## 2013-11-30 ENCOUNTER — Ambulatory Visit (INDEPENDENT_AMBULATORY_CARE_PROVIDER_SITE_OTHER): Payer: BC Managed Care – PPO | Admitting: Adult Health

## 2013-11-30 VITALS — BP 118/82 | HR 62 | Temp 98.4°F | Ht 63.4 in | Wt 195.0 lb

## 2013-11-30 DIAGNOSIS — Z23 Encounter for immunization: Secondary | ICD-10-CM | POA: Insufficient documentation

## 2013-11-30 DIAGNOSIS — R5383 Other fatigue: Secondary | ICD-10-CM

## 2013-11-30 DIAGNOSIS — E559 Vitamin D deficiency, unspecified: Secondary | ICD-10-CM

## 2013-11-30 DIAGNOSIS — Z0000 Encounter for general adult medical examination without abnormal findings: Secondary | ICD-10-CM

## 2013-11-30 DIAGNOSIS — Z1211 Encounter for screening for malignant neoplasm of colon: Secondary | ICD-10-CM

## 2013-11-30 DIAGNOSIS — Z13820 Encounter for screening for osteoporosis: Secondary | ICD-10-CM

## 2013-11-30 DIAGNOSIS — E785 Hyperlipidemia, unspecified: Secondary | ICD-10-CM

## 2013-11-30 DIAGNOSIS — I10 Essential (primary) hypertension: Secondary | ICD-10-CM | POA: Insufficient documentation

## 2013-11-30 DIAGNOSIS — R5381 Other malaise: Secondary | ICD-10-CM

## 2013-11-30 DIAGNOSIS — E538 Deficiency of other specified B group vitamins: Secondary | ICD-10-CM | POA: Insufficient documentation

## 2013-11-30 LAB — CBC WITH DIFFERENTIAL/PLATELET
Basophils Absolute: 0 10*3/uL (ref 0.0–0.1)
Basophils Relative: 0.6 % (ref 0.0–3.0)
EOS PCT: 1.1 % (ref 0.0–5.0)
Eosinophils Absolute: 0.1 10*3/uL (ref 0.0–0.7)
HEMATOCRIT: 40 % (ref 36.0–46.0)
HEMOGLOBIN: 13.5 g/dL (ref 12.0–15.0)
Lymphocytes Relative: 32.3 % (ref 12.0–46.0)
Lymphs Abs: 2.6 10*3/uL (ref 0.7–4.0)
MCHC: 33.8 g/dL (ref 30.0–36.0)
MCV: 93 fl (ref 78.0–100.0)
MONOS PCT: 4.9 % (ref 3.0–12.0)
Monocytes Absolute: 0.4 10*3/uL (ref 0.1–1.0)
NEUTROS ABS: 4.9 10*3/uL (ref 1.4–7.7)
Neutrophils Relative %: 61.1 % (ref 43.0–77.0)
PLATELETS: 200 10*3/uL (ref 150.0–400.0)
RBC: 4.3 Mil/uL (ref 3.87–5.11)
RDW: 14.2 % (ref 11.5–14.6)
WBC: 8 10*3/uL (ref 4.5–10.5)

## 2013-11-30 LAB — BASIC METABOLIC PANEL
BUN: 17 mg/dL (ref 6–23)
CHLORIDE: 105 meq/L (ref 96–112)
CO2: 27 mEq/L (ref 19–32)
Calcium: 9.4 mg/dL (ref 8.4–10.5)
Creatinine, Ser: 0.8 mg/dL (ref 0.4–1.2)
GFR: 96.34 mL/min (ref >60.00)
Glucose, Bld: 106 mg/dL — ABNORMAL HIGH (ref 70–99)
Potassium: 4.3 mEq/L (ref 3.5–5.1)
SODIUM: 139 meq/L (ref 135–145)

## 2013-11-30 LAB — TSH: TSH: 1.83 u[IU]/mL (ref 0.35–5.50)

## 2013-11-30 LAB — LIPID PANEL
Cholesterol: 237 mg/dL — ABNORMAL HIGH (ref 0–200)
HDL: 48.4 mg/dL (ref >39.00)
LDL CALC: 150 mg/dL — AB (ref 0–99)
Total CHOL/HDL Ratio: 5
Triglycerides: 193 mg/dL — ABNORMAL HIGH (ref 0.0–149.0)
VLDL: 38.6 mg/dL (ref 0.0–40.0)

## 2013-11-30 LAB — VITAMIN B12: Vitamin B-12: 186 pg/mL — ABNORMAL LOW (ref 211–911)

## 2013-11-30 LAB — FOLATE: FOLATE: 6.5 ng/mL (ref >5.9)

## 2013-11-30 NOTE — Patient Instructions (Signed)
  You had your Medicare Wellness Exam today.  Please have your labs drawn before leaving the office.  You received your pneumonia and tetanus vaccines today.  Please schedule your diabetic eye exam.  You are being referred to Dr. Roselyn Reef for your screening colonoscopy. They will call you with appointment.  You will also be called for an appointment for your bone density scan.

## 2013-11-30 NOTE — Progress Notes (Signed)
Pre visit review using our clinic review tool, if applicable. No additional management support is needed unless otherwise documented below in the visit note. 

## 2013-11-30 NOTE — Progress Notes (Signed)
Patient ID: Kathy Bryant, female   DOB: Mar 11, 1946, 68 y.o.   MRN: [id number]    Subjective:    Patient ID: Kathy Bryant, female    DOB: 07-Oct-1945, 68 y.o.   MRN: [id number]  HPI  The patient is here for annual Medicare wellness examination.   The risk factors are reflected in the social history.  The roster of all physicians providing medical care to patient is listed in the Snapshot section of the chart.  Activities of daily living:  The patient is 100% independent in all ADLs: dressing, toileting, bathing, feeding as well as independent mobility.  Instrumental Activities of daily living: The patient is 100% independent in all iADLs: cooking, driving, keeping track of finances, managing medications, shopping, using telephone and computer.  Home safety: The patient has smoke detectors in the home. Seatbelts are worn 100%.  There are no firearms at home. There is no violence in the home. No hx of IPV.  There is no risks for hepatitis, STDs or HIV. There is no history of blood transfusion. No travel history to infectious disease endemic areas of the world.  The patient has seen dentist in the last six month. Pt has seen eye doctor in the last year. No hearing impairment. They have deferred audiologic testing in the last year.  No excessive sun exposure. Sun protection: hats, long sleeves and use of sunscreen if there is significant sun exposure.   Diet: the importance of a healthy diet is discussed. She would like to lose some weight.  The benefits of regular aerobic exercise were discussed.   Depression screen: there are no signs or vegative symptoms of depression- irritability, change in appetite, anhedonia, sadness/tearfullness.  Cognitive assessment: the patient manages all their financial and personal affairs and is actively engaged. Able to relate day,date,year and events; recalled 2/3 objects at 3 minutes; performed clock-face test normally.  The following portions of  the patient's history were reviewed and updated as appropriate: allergies, current medications, past family history, past medical history,  past surgical history, past social history  and problem list.  Visual acuity was not assessed per patient preference since has regular follow up with ophthalmologist. Hearing and body mass index were assessed and reviewed.   During the course of the visit the patient was educated and counseled about appropriate screening and preventive services including : fall prevention , diabetes screening, nutrition counseling, colorectal cancer screening, and recommended immunizations.     Past Medical History  Diagnosis Date  . Diabetes mellitus without complication   . Herniated disc, cervical   . Fatty liver   . Microscopic hematuria      Past Surgical History  Procedure Laterality Date  . Gallbladder surgery  1982  . Appendectomy  1968  . Total abdominal hysterectomy w/ bilateral salpingoophorectomy  1998  . Cesarean section  1972 & 1978  . Hernia repair      Abdominal  . Breast biopsy Right 2014    benign     Family History  Problem Relation Age of Onset  . Diabetes Mother   . Cancer Mother 13    Brain Tumor - died age 32  . Heart disease Father     CAD - died MI - age 70  . Hypertension Father   . Diabetes Sister   . Diabetes Brother   . Diabetes Brother   . Cancer Paternal Aunt     Renal Cell Ca  . Cancer Paternal Uncle  Renal Cell Ca     History   Social History  . Marital Status: Married    Spouse Name: N/A    Number of Children: 2  . Years of Education: 13   Occupational History  . Animator    Social History Main Topics  . Smoking status: Never Smoker   . Smokeless tobacco: Not on file  . Alcohol Use: Yes     Comment: Occasional glass of wine  . Drug Use: No  . Sexual Activity: Not on file   Other Topics Concern  . Not on file   Social History Narrative  . No narrative on file      Current Outpatient Prescriptions on File Prior to Visit  Medication Sig Dispense Refill  . cyclobenzaprine (FLEXERIL) 5 MG tablet Take 1 tablet (5 mg total) by mouth 3 (three) times daily as needed for muscle spasms.  30 tablet  1  . lisinopril (PRINIVIL,ZESTRIL) 2.5 MG tablet Take 1 tablet (2.5 mg total) by mouth once.  30 tablet  3  . metFORMIN (GLUCOPHAGE) 500 MG tablet Take 500 mg by mouth 2 (two) times daily.       No current facility-administered medications on file prior to visit.     Review of Systems  Constitutional: Positive for fatigue (mild).  HENT: Negative.   Eyes: Negative.   Respiratory: Negative.   Cardiovascular: Negative.   Gastrointestinal: Negative.   Endocrine: Negative.   Genitourinary: Negative.   Musculoskeletal: Negative.   Skin: Negative.   Allergic/Immunologic: Negative.   Neurological: Negative.   Hematological: Negative.   Psychiatric/Behavioral: Negative.        Objective:  BP 118/82  Pulse 62  Temp(Src) 98.4 F (36.9 C) (Oral)  Ht 5' 3.4" (1.61 m)  Wt 195 lb (88.451 kg)  BMI 34.12 kg/m2  SpO2 95%   Physical Exam  Constitutional: She is oriented to person, place, and time. She appears well-developed and well-nourished. No distress.  HENT:  Head: Normocephalic and atraumatic.  Right Ear: External ear normal.  Left Ear: External ear normal.  Nose: Nose normal.  Mouth/Throat: Oropharynx is clear and moist.  Eyes: Conjunctivae and EOM are normal. Pupils are equal, round, and reactive to light.  Neck: Normal range of motion. Neck supple. No tracheal deviation present. No thyromegaly present.  Cardiovascular: Normal rate, regular rhythm, normal heart sounds and intact distal pulses.  Exam reveals no gallop and no friction rub.   No murmur heard. Pulmonary/Chest: Effort normal and breath sounds normal. No respiratory distress. She has no wheezes. She has no rales.  Abdominal: Soft. Bowel sounds are normal. She exhibits no distension  and no mass. There is no tenderness. There is no rebound and no guarding.  Musculoskeletal: Normal range of motion. She exhibits no edema and no tenderness.  Lymphadenopathy:    She has no cervical adenopathy.  Neurological: She is alert and oriented to person, place, and time. She has normal reflexes. No cranial nerve deficit. Coordination normal.  Skin: Skin is warm and dry.  Psychiatric: She has a normal mood and affect. Her behavior is normal. Judgment and thought content normal.      Assessment & Plan:   1. Routine general medical examination at a health care facility Normal physical exam. Check routine labs. Screenings addressed  2. Fatigue Mild symptoms. Check labs to r/o anemia or thyroid issues - TSH - CBC with Differential  3. Vitamin D deficiency Hx of vitamin d def - need to screen and  monitor for osteoporosis - Vit D  25 hydroxy (rtn osteoporosis monitoring)  4. B12 deficiency Hx of B12 def in the past. Symptoms of fatigue. Will check levels and folate - Vitamin B12 - Folate  5. HLD (hyperlipidemia) Check cholesterol. Adjust/Add medication as necessary - Lipid panel  6. HTN (hypertension) Well controlled. Check labs. - Basic metabolic panel  7. Screen for colon cancer Refer to Dr. Roselyn Reef for screening - Ambulatory referral to Gastroenterology  8. Screening for osteoporosis Bone scan order. She has not had one done - DG Bone Density; Future  9. Need for prophylactic vaccination against Streptococcus pneumoniae (pneumococcus) Prevnar administered today - Pneumococcal conjugate vaccine 13-valent  10. Need for TD vaccine Tetanus administered today - Td vaccine preservative free greater than or equal to 7yo IM

## 2013-12-01 ENCOUNTER — Other Ambulatory Visit: Payer: Self-pay | Admitting: Adult Health

## 2013-12-01 LAB — VITAMIN D 25 HYDROXY (VIT D DEFICIENCY, FRACTURES): VIT D 25 HYDROXY: 13 ng/mL — AB (ref 30–89)

## 2013-12-01 MED ORDER — VITAMIN D (ERGOCALCIFEROL) 1.25 MG (50000 UNIT) PO CAPS: 50000.0000 [IU] | ORAL_CAPSULE | ORAL | Status: DC

## 2013-12-01 MED ORDER — ATORVASTATIN CALCIUM 40 MG PO TABS: 40.0000 mg | ORAL_TABLET | Freq: Every day | ORAL | Status: DC

## 2013-12-03 ENCOUNTER — Encounter: Payer: Self-pay | Admitting: Adult Health

## 2013-12-03 ENCOUNTER — Telehealth: Payer: Self-pay | Admitting: Adult Health

## 2013-12-03 NOTE — Telephone Encounter (Signed)
Relevant patient education assigned to patient using Emmi. ° °

## 2013-12-04 ENCOUNTER — Encounter: Payer: Self-pay | Admitting: *Deleted

## 2013-12-04 ENCOUNTER — Encounter: Payer: Self-pay | Admitting: Adult Health

## 2013-12-05 ENCOUNTER — Other Ambulatory Visit: Payer: Self-pay | Admitting: Adult Health

## 2013-12-05 MED ORDER — LORCASERIN HCL 10 MG PO TABS: 10.0000 mg | ORAL_TABLET | Freq: Two times a day (BID) | ORAL | Status: DC

## 2013-12-10 ENCOUNTER — Telehealth: Payer: Self-pay | Admitting: *Deleted

## 2013-12-10 ENCOUNTER — Ambulatory Visit (INDEPENDENT_AMBULATORY_CARE_PROVIDER_SITE_OTHER): Payer: Medicare Other | Admitting: *Deleted

## 2013-12-10 ENCOUNTER — Encounter: Payer: Self-pay | Admitting: Adult Health

## 2013-12-10 DIAGNOSIS — E538 Deficiency of other specified B group vitamins: Secondary | ICD-10-CM

## 2013-12-10 MED ORDER — CYANOCOBALAMIN 1000 MCG/ML IJ SOLN
1000.0000 ug | Freq: Once | INTRAMUSCULAR | Status: AC
  Administered 2013-12-10: 1000 ug via INTRAMUSCULAR

## 2013-12-10 NOTE — Telephone Encounter (Signed)
Absolutely.

## 2013-12-10 NOTE — Telephone Encounter (Signed)
Pt in for her first B12 injection today. Will return for 2 more injections this week. Her daughter is a Marine scientist and would like to continue B12 injections at home by her daughter after this week. Ok to send Rx to pharmacy?

## 2013-12-11 MED ORDER — CYANOCOBALAMIN 1000 MCG/ML IJ SOLN: 1000.0000 ug | INTRAMUSCULAR | Status: DC

## 2013-12-11 NOTE — Telephone Encounter (Signed)
Recalled Lorcaserin to pharmacy.

## 2013-12-12 ENCOUNTER — Ambulatory Visit (INDEPENDENT_AMBULATORY_CARE_PROVIDER_SITE_OTHER): Payer: Medicare Other

## 2013-12-12 DIAGNOSIS — E538 Deficiency of other specified B group vitamins: Secondary | ICD-10-CM

## 2013-12-12 MED ORDER — CYANOCOBALAMIN 1000 MCG/ML IJ SOLN
1000.0000 ug | Freq: Once | INTRAMUSCULAR | Status: AC
  Administered 2013-12-12: 1000 ug via INTRAMUSCULAR

## 2013-12-14 ENCOUNTER — Encounter: Payer: Self-pay | Admitting: Adult Health

## 2013-12-14 ENCOUNTER — Ambulatory Visit (INDEPENDENT_AMBULATORY_CARE_PROVIDER_SITE_OTHER): Payer: BC Managed Care – PPO | Admitting: *Deleted

## 2013-12-14 ENCOUNTER — Telehealth: Payer: Self-pay | Admitting: Adult Health

## 2013-12-14 DIAGNOSIS — E538 Deficiency of other specified B group vitamins: Secondary | ICD-10-CM

## 2013-12-14 MED ORDER — CYANOCOBALAMIN 1000 MCG/ML IJ SOLN
1000.0000 ug | Freq: Once | INTRAMUSCULAR | Status: AC
  Administered 2013-12-14: 1000 ug via INTRAMUSCULAR

## 2013-12-14 NOTE — Telephone Encounter (Signed)
States vitamin B12 medication is to be sent to pharmacy for her to begin doing on her own.  Also states belviq was to be called in.

## 2013-12-14 NOTE — Telephone Encounter (Signed)
Spoke with patient and informed her that the B-12 is ready for pick up at [facility]. The Belviq needs prior auth and has to be approved by her insurance. Patient verbalized understanding.

## 2013-12-14 NOTE — Telephone Encounter (Signed)
Belviq PA completed and given to Raquel for signature

## 2013-12-19 ENCOUNTER — Encounter: Payer: Self-pay | Admitting: Adult Health

## 2013-12-20 ENCOUNTER — Encounter: Payer: Self-pay | Admitting: Adult Health

## 2013-12-24 ENCOUNTER — Encounter: Payer: Self-pay | Admitting: Adult Health

## 2013-12-25 NOTE — Telephone Encounter (Signed)
Spoke with patient as I was typing last note. Patient stated she will call [facility] to request a refill.

## 2013-12-25 NOTE — Telephone Encounter (Signed)
Mr. Derk received refills on both medications 10/23/13 with 6 refills. Please contact your [facility] pharmacy for refills.   Thank you Mialynn Shelvin A. LPN

## 2014-03-05 HISTORY — PX: COLONOSCOPY: SHX174

## 2014-03-05 LAB — HM COLONOSCOPY

## 2014-03-06 ENCOUNTER — Encounter: Payer: Self-pay | Admitting: *Deleted

## 2014-03-06 DIAGNOSIS — E119 Type 2 diabetes mellitus without complications: Secondary | ICD-10-CM

## 2014-03-06 NOTE — Progress Notes (Signed)
Chart reviewed for DM bundle. Due for LDL recheck. Orders placed

## 2014-03-14 ENCOUNTER — Telehealth: Payer: Self-pay | Admitting: Internal Medicine

## 2014-03-14 ENCOUNTER — Other Ambulatory Visit: Payer: Self-pay | Admitting: Adult Health

## 2014-03-14 ENCOUNTER — Encounter: Payer: Self-pay | Admitting: *Deleted

## 2014-03-15 ENCOUNTER — Other Ambulatory Visit: Payer: Self-pay | Admitting: *Deleted

## 2014-03-15 MED ORDER — METFORMIN HCL 500 MG PO TABS: 500.0000 mg | ORAL_TABLET | Freq: Two times a day (BID) | ORAL | Status: DC

## 2014-04-05 ENCOUNTER — Encounter: Payer: Self-pay | Admitting: Internal Medicine

## 2014-04-08 ENCOUNTER — Encounter: Payer: Self-pay | Admitting: Internal Medicine

## 2014-04-15 ENCOUNTER — Other Ambulatory Visit: Payer: Self-pay | Admitting: Adult Health

## 2014-04-26 ENCOUNTER — Other Ambulatory Visit: Payer: Self-pay | Admitting: Adult Health

## 2014-04-26 ENCOUNTER — Encounter: Payer: Self-pay | Admitting: Adult Health

## 2014-04-26 ENCOUNTER — Other Ambulatory Visit (INDEPENDENT_AMBULATORY_CARE_PROVIDER_SITE_OTHER): Payer: BC Managed Care – PPO

## 2014-04-26 DIAGNOSIS — E538 Deficiency of other specified B group vitamins: Secondary | ICD-10-CM

## 2014-04-26 DIAGNOSIS — E559 Vitamin D deficiency, unspecified: Secondary | ICD-10-CM

## 2014-04-26 DIAGNOSIS — E119 Type 2 diabetes mellitus without complications: Secondary | ICD-10-CM

## 2014-04-26 LAB — COMPREHENSIVE METABOLIC PANEL
ALK PHOS: 55 U/L (ref 39–117)
ALT: 21 U/L (ref 0–35)
AST: 20 U/L (ref 0–37)
Albumin: 3.7 g/dL (ref 3.5–5.2)
BILIRUBIN TOTAL: 0.3 mg/dL (ref 0.2–1.2)
BUN: 14 mg/dL (ref 6–23)
CO2: 27 meq/L (ref 19–32)
CREATININE: 0.8 mg/dL (ref 0.4–1.2)
Calcium: 9.1 mg/dL (ref 8.4–10.5)
Chloride: 108 mEq/L (ref 96–112)
GFR: 79.52 mL/min (ref >60.00)
GLUCOSE: 141 mg/dL — AB (ref 70–99)
Potassium: 4.2 mEq/L (ref 3.5–5.1)
Sodium: 142 mEq/L (ref 135–145)
TOTAL PROTEIN: 7.1 g/dL (ref 6.0–8.3)

## 2014-04-26 LAB — LIPID PANEL
CHOLESTEROL: 238 mg/dL — AB (ref 0–200)
HDL: 44.4 mg/dL (ref >39.00)
LDL CALC: 158 mg/dL — AB (ref 0–99)
NonHDL: 193.6
TRIGLYCERIDES: 177 mg/dL — AB (ref 0.0–149.0)
Total CHOL/HDL Ratio: 5
VLDL: 35.4 mg/dL (ref 0.0–40.0)

## 2014-04-26 LAB — VITAMIN B12: VITAMIN B 12: 343 pg/mL (ref 211–911)

## 2014-04-26 LAB — VITAMIN D 25 HYDROXY (VIT D DEFICIENCY, FRACTURES): VITD: 29.92 ng/mL — AB (ref 30.00–100.00)

## 2014-04-26 MED ORDER — LOVASTATIN 20 MG PO TABS: 20.0000 mg | ORAL_TABLET | Freq: Every day | ORAL | Status: DC

## 2014-04-26 MED ORDER — LISINOPRIL 2.5 MG PO TABS: 2.5000 mg | ORAL_TABLET | Freq: Every day | ORAL | Status: DC

## 2014-09-10 ENCOUNTER — Ambulatory Visit (INDEPENDENT_AMBULATORY_CARE_PROVIDER_SITE_OTHER): Payer: BC Managed Care – PPO | Admitting: Nurse Practitioner

## 2014-09-10 ENCOUNTER — Encounter: Payer: Self-pay | Admitting: Nurse Practitioner

## 2014-09-10 VITALS — BP 128/86 | HR 84 | Temp 98.0°F | Resp 14 | Ht 64.0 in | Wt 203.1 lb

## 2014-09-10 DIAGNOSIS — H6592 Unspecified nonsuppurative otitis media, left ear: Secondary | ICD-10-CM

## 2014-09-10 DIAGNOSIS — R05 Cough: Secondary | ICD-10-CM

## 2014-09-10 DIAGNOSIS — R059 Cough, unspecified: Secondary | ICD-10-CM

## 2014-09-10 DIAGNOSIS — H6590 Unspecified nonsuppurative otitis media, unspecified ear: Secondary | ICD-10-CM | POA: Insufficient documentation

## 2014-09-10 MED ORDER — AMOXICILLIN 500 MG PO TABS: 500.0000 mg | ORAL_TABLET | Freq: Two times a day (BID) | ORAL | Status: DC

## 2014-09-10 MED ORDER — GUAIFENESIN-CODEINE 100-10 MG/5ML PO SYRP: 5.0000 mL | ORAL_SOLUTION | Freq: Three times a day (TID) | ORAL | Status: DC | PRN

## 2014-09-10 NOTE — Progress Notes (Signed)
Pre visit review using our clinic review tool, if applicable. No additional management support is needed unless otherwise documented below in the visit note. 

## 2014-09-10 NOTE — Patient Instructions (Addendum)
Please take B complex vitamins over the counter in addition to Vitamin D.   Please make another appointment to discuss medications to help aid your diet.   Call us if worsening or failure to improve.

## 2014-09-10 NOTE — Progress Notes (Signed)
Subjective:    Patient ID: Kathy Bryant, female    DOB: 08-Feb-1946, 69 y.o.   MRN: [id number]  HPI  Kathy Bryant is a 69 yo female with a CC of cough and left ear pressure x 2 weeks. She states her coughing has been for over a month.   1) 2 weeks kept grandson who is 82 yo and he was sick also. Left ear, stopped up, pain happened for a few days and slept on heating pad. Coughing up clear phlegm now. Lots of PNDrip and sinus pressure.  Sudafed- Helpful  Ibuprofen- ear pain  Drinks lots of water  Review of Systems  Constitutional: Positive for fatigue. Negative for fever, chills and diaphoresis.  HENT: Positive for ear pain, postnasal drip, rhinorrhea, sinus pressure, sneezing and sore throat. Negative for ear discharge and facial swelling.        Painfulness has resolved, frontal sinus headache  Eyes: Positive for discharge.       Watery  Respiratory: Positive for cough. Negative for chest tightness, shortness of breath and wheezing.        Coughing is clear   Cardiovascular: Negative for chest pain, palpitations and leg swelling.  Gastrointestinal: Negative for nausea, vomiting and diarrhea.  Skin: Negative for rash.  Neurological: Positive for headaches. Negative for dizziness, weakness and numbness.  Psychiatric/Behavioral: The patient is not nervous/anxious.    Past Medical History  Diagnosis Date  . Diabetes mellitus without complication   . Herniated disc, cervical   . Fatty liver   . Microscopic hematuria   . Obesity   . HTN (hypertension)     History   Social History  . Marital Status: Married    Spouse Name: N/A    Number of Children: 2  . Years of Education: 13   Occupational History  . Animator    Social History Main Topics  . Smoking status: Never Smoker   . Smokeless tobacco: Not on file  . Alcohol Use: Yes     Comment: Occasional glass of wine  . Drug Use: No  . Sexual Activity: Not on file   Other Topics Concern  . Not on  file   Social History Narrative    Past Surgical History  Procedure Laterality Date  . Gallbladder surgery  1982  . Appendectomy  1968  . Total abdominal hysterectomy w/ bilateral salpingoophorectomy  1998  . Cesarean section  1972 & 1978  . Hernia repair      Abdominal  . Breast biopsy Right 2014    benign    Family History  Problem Relation Age of Onset  . Diabetes Mother   . Cancer Mother 56    Brain Tumor - died age 102  . Heart disease Father     CAD - died MI - age 58  . Hypertension Father   . Diabetes Sister   . Diabetes Brother   . Diabetes Brother   . Cancer Paternal Aunt     Renal Cell Ca  . Cancer Paternal Uncle     Renal Cell Ca    No Known Allergies  Current Outpatient Prescriptions on File Prior to Visit  Medication Sig Dispense Refill  . cyanocobalamin (,VITAMIN B-12,) 1000 MCG/ML injection Inject 1 mL (1,000 mcg total) into the muscle every 30 (thirty) days. 10 mL 1  . cyclobenzaprine (FLEXERIL) 5 MG tablet Take 1 tablet (5 mg total) by mouth 3 (three) times daily as needed for muscle spasms. 30 tablet  1  . lisinopril (PRINIVIL,ZESTRIL) 2.5 MG tablet Take 1 tablet (2.5 mg total) by mouth daily. 30 tablet 6  . Lorcaserin HCl 10 MG TABS Take 10 mg by mouth 2 (two) times daily before a meal. 60 tablet 2  . lovastatin (MEVACOR) 20 MG tablet Take 1 tablet (20 mg total) by mouth at bedtime. 30 tablet 3  . metFORMIN (GLUCOPHAGE) 500 MG tablet Take 1 tablet (500 mg total) by mouth 2 (two) times daily. 60 tablet 3  . Vitamin D, Ergocalciferol, (DRISDOL) 50000 UNITS CAPS capsule Take 1 capsule (50,000 Units total) by mouth every 7 (seven) days. 12 capsule 0   No current facility-administered medications on file prior to visit.          Objective:   Physical Exam  Constitutional: She is oriented to person, place, and time. She appears well-developed and well-nourished. No distress.  BP 128/86 mmHg  Pulse 84  Temp(Src) 98 F (36.7 C) (Oral)  Resp 14   Ht 5\' 4"  (1.626 m)  Wt 203 lb 1.9 oz (92.135 kg)  BMI 34.85 kg/m2  SpO2 97%   HENT:  Head: Normocephalic and atraumatic.  Right Ear: External ear normal.  Left Ear: External ear normal.  Mouth/Throat: No oropharyngeal exudate.  Left TM is red, no landmarks visualized, bulging. Right TM slightly red in the canal.   Eyes: Conjunctivae and EOM are normal. Pupils are equal, round, and reactive to light. Right eye exhibits no discharge. Left eye exhibits no discharge. No scleral icterus.  Neck: Normal range of motion. Neck supple. No thyromegaly present.  Cardiovascular: Normal rate, regular rhythm, normal heart sounds and intact distal pulses.  Exam reveals no gallop and no friction rub.   No murmur heard. Pulmonary/Chest: Effort normal and breath sounds normal. No respiratory distress. She has no wheezes. She has no rales. She exhibits no tenderness.  Lymphadenopathy:    She has no cervical adenopathy.  Neurological: She is alert and oriented to person, place, and time. No cranial nerve deficit. She exhibits normal muscle tone. Coordination normal.  Skin: Skin is warm and dry. No rash noted. She is not diaphoretic.  Psychiatric: She has a normal mood and affect. Her behavior is normal. Judgment and thought content normal.        Assessment & Plan:

## 2014-09-12 DIAGNOSIS — R059 Cough, unspecified: Secondary | ICD-10-CM | POA: Insufficient documentation

## 2014-09-12 DIAGNOSIS — R05 Cough: Secondary | ICD-10-CM | POA: Insufficient documentation

## 2014-09-12 NOTE — Assessment & Plan Note (Signed)
Worsening. Failure to improve and disrupting sleep. Robitussin AC 5 ml at night, can take up to 3 x day if not doing anything requiring mental sharpness. FU prn worsening/failure to improve.

## 2014-09-12 NOTE — Assessment & Plan Note (Signed)
Worsening. Ear pain on left was most problematic. Amoxicillin 500 mg twice daily for 7 days. FU prn worsening/failure to improve.

## 2014-09-17 ENCOUNTER — Other Ambulatory Visit: Payer: Self-pay | Admitting: Nurse Practitioner

## 2014-09-17 ENCOUNTER — Telehealth: Payer: Self-pay

## 2014-09-17 MED ORDER — AZITHROMYCIN 250 MG PO TABS: ORAL_TABLET | ORAL | Status: DC

## 2014-09-17 NOTE — Telephone Encounter (Signed)
Let her know that I called in the next drug of choice, a Z-pack to [place]. Thanks!

## 2014-09-17 NOTE — Telephone Encounter (Signed)
The patient called and is hoping to get another round of medication prescribed for her ear pain. She states it has not resolved.  Pt callback - [phone]

## 2014-09-17 NOTE — Telephone Encounter (Signed)
Notified patient.  Patient verbalized understanding. 

## 2014-09-30 ENCOUNTER — Other Ambulatory Visit: Payer: Self-pay | Admitting: Nurse Practitioner

## 2014-09-30 DIAGNOSIS — H9192 Unspecified hearing loss, left ear: Secondary | ICD-10-CM

## 2014-10-07 ENCOUNTER — Encounter: Payer: Self-pay | Admitting: Nurse Practitioner

## 2014-10-07 ENCOUNTER — Ambulatory Visit (INDEPENDENT_AMBULATORY_CARE_PROVIDER_SITE_OTHER): Payer: BC Managed Care – PPO | Admitting: Nurse Practitioner

## 2014-10-07 ENCOUNTER — Telehealth: Payer: Self-pay | Admitting: Nurse Practitioner

## 2014-10-07 VITALS — BP 118/64 | HR 76 | Temp 98.3°F | Resp 14 | Ht 64.0 in | Wt 204.5 lb

## 2014-10-07 DIAGNOSIS — J329 Chronic sinusitis, unspecified: Secondary | ICD-10-CM | POA: Insufficient documentation

## 2014-10-07 DIAGNOSIS — J324 Chronic pansinusitis: Secondary | ICD-10-CM

## 2014-10-07 NOTE — Telephone Encounter (Signed)
FYI

## 2014-10-07 NOTE — Progress Notes (Signed)
Pre visit review using our clinic review tool, if applicable. No additional management support is needed unless otherwise documented below in the visit note. 

## 2014-10-07 NOTE — Telephone Encounter (Signed)
Roberto ScalesRonnald Ramp, RN, Miranda Date/Time (Eastern Time): 10/07/2014 10:47:42 AM Confirm and document reason for call. If symptomatic, describe symptoms. ---Caller states she was seen 4 weeks ago and diagnosed with an ear infection, prescribed antibiotics. Called back and not improved, prescribed another antibiotic. Still having symptoms and referred to ENT. The ENT doctor said he thought there was a virus that attacked the nerve in her ear and that is causing the hearing problems. He prescribed steroids. She started having sinus issues again last week, congestion and cough, that has gotten worse over the weekend. Has the patient traveled out of the country within the last 30 days? ---No Does the patient require triage? ---Yes Related visit to physician within the last 2 weeks? ---Yes Does the PT have any chronic conditions? (i.e. diabetes, asthma, etc.) ---Yes List chronic conditions. ---boarder line Diabetes, High Cholesterol, Guidelines Guideline Title Affirmed Question Affirmed Notes Cough - Acute Productive SEVERE coughing spells (e.g., whooping sound after coughing, vomiting after coughing) Final Disposition User See Physician within 24 Hours Ronnald Ramp, RN, Miranda Comments Appt scheduled at 4:30 with Lorane Gell

## 2014-10-07 NOTE — Patient Instructions (Addendum)
Your cough may be coming from reflux, allergies, or post nasal drip (PND).  PND and allergies can be treated with benadryl, allegra, Zyrtec and claritin. Benadryl is the most effective for drying you up but it is also the most sedating,  So try taking it at night and use one of the others in the daytime  Consider using simply Saline to flush your sinuses twice daily when you have congestion to prevent sinus infections or NeilMed Rinse   Cancel your Thursday appointment. Call us instead if not improving (or ENT they might be more helpful).

## 2014-10-07 NOTE — Progress Notes (Signed)
Subjective:    Patient ID: Kathy Bryant, female    DOB: [birth year], 69 y.o.   MRN: [id number]  HPI  [name] is a 69 yo female with sinus symptoms that started in December.   1) Background Info:  Pt started with Sinus symptoms in December. I saw her in early Feb. She was diagnosed with AOM of left ear. Amoxicillin was not helpful and we tried Z-pack. This was not helpful either. I referred her to ENT because of trouble hearing from left ear and failure to two abx. She was started on prednisone 10/04/14. Cough and sinus pressure began again on 10/03/14. Friday/Saturday- Chills, sweats no known temperature.   Sudafed- not helpful Prednisone- per ENT   Review of Systems  Constitutional: Positive for fatigue. Negative for fever, chills and diaphoresis.  HENT: Positive for sinus pressure.   Eyes: Negative for visual disturbance.  Respiratory: Positive for cough. Negative for chest tightness, shortness of breath and wheezing.   Cardiovascular: Negative for chest pain, palpitations and leg swelling.  Gastrointestinal: Negative for nausea, vomiting and diarrhea.  Musculoskeletal: Negative for myalgias.  Skin: Negative for rash.  Neurological: Negative for dizziness, weakness, numbness and headaches.  Psychiatric/Behavioral: The patient is not nervous/anxious.    Past Medical History  Diagnosis Date  . Diabetes mellitus without complication   . Herniated disc, cervical   . Fatty liver   . Microscopic hematuria   . Obesity   . HTN (hypertension)     History   Social History  . Marital Status: Married    Spouse Name: N/A  . Number of Children: 2  . Years of Education: 13   Occupational History  . Animator    Social History Main Topics  . Smoking status: Never Smoker   . Smokeless tobacco: Not on file  . Alcohol Use: Yes     Comment: Occasional glass of wine  . Drug Use: No  . Sexual Activity: Not on file   Other Topics Concern  . Not on file    Social History Narrative    Past Surgical History  Procedure Laterality Date  . Gallbladder surgery  1982  . Appendectomy  1968  . Total abdominal hysterectomy w/ bilateral salpingoophorectomy  1998  . Cesarean section  1972 & 1978  . Hernia repair      Abdominal  . Breast biopsy Right 2014    benign    Family History  Problem Relation Age of Onset  . Diabetes Mother   . Cancer Mother 46    Brain Tumor - died age 44  . Heart disease Father     CAD - died MI - age 48  . Hypertension Father   . Diabetes Sister   . Diabetes Brother   . Diabetes Brother   . Cancer Paternal Aunt     Renal Cell Ca  . Cancer Paternal Uncle     Renal Cell Ca    No Known Allergies  Current Outpatient Prescriptions on File Prior to Visit  Medication Sig Dispense Refill  . guaiFENesin-codeine (ROBITUSSIN AC) 100-10 MG/5ML syrup Take 5 mLs by mouth 3 (three) times daily as needed for cough. 120 mL 0  . lisinopril (PRINIVIL,ZESTRIL) 2.5 MG tablet Take 1 tablet (2.5 mg total) by mouth daily. 30 tablet 6  . lovastatin (MEVACOR) 20 MG tablet Take 1 tablet (20 mg total) by mouth at bedtime. 30 tablet 3  . metFORMIN (GLUCOPHAGE) 500 MG tablet Take 1 tablet (500 mg  total) by mouth 2 (two) times daily. 60 tablet 3   No current facility-administered medications on file prior to visit.       Objective:   Physical Exam  Constitutional: She is oriented to person, place, and time. She appears well-developed and well-nourished. No distress.  BP 118/64 mmHg  Pulse 76  Temp(Src) 98.3 F (36.8 C) (Oral)  Resp 14  Ht 5\' 4"  (1.626 m)  Wt 204 lb 8 oz (92.761 kg)  BMI 35.09 kg/m2  SpO2 97%   HENT:  Head: Normocephalic and atraumatic.  Right Ear: External ear normal.  Left Ear: External ear normal.  Mouth/Throat: No oropharyngeal exudate.  TM's injected bilaterally, canals slightly reddened as well  Eyes: Right eye exhibits no discharge. Left eye exhibits no discharge. No scleral icterus.  Neck:  Normal range of motion. Neck supple. No thyromegaly present.  Cardiovascular: Normal rate, regular rhythm and normal heart sounds.  Exam reveals no gallop and no friction rub.   No murmur heard. Pulmonary/Chest: Effort normal and breath sounds normal. No respiratory distress. She has no wheezes. She has no rales. She exhibits no tenderness.  Lymphadenopathy:    She has no cervical adenopathy.  Neurological: She is alert and oriented to person, place, and time. No cranial nerve deficit. She exhibits normal muscle tone. Coordination normal.  Skin: Skin is warm and dry. No rash noted. She is not diaphoretic.  Psychiatric: She has a normal mood and affect. Her behavior is normal. Judgment and thought content normal.      Assessment & Plan:

## 2014-10-07 NOTE — Assessment & Plan Note (Signed)
Will treat conservatively after being on 2 rounds of abx and currently on prednisone. Sudafed PE for congestion, Robitussin AC at night for cough, Benadryl/Allegra/Claritin/Zyrtec one of those for allergy symptoms, and some form of nasal rinsing OTC. FU by call if worsening/failure to improve. Asked her to consider ENT again if not improving.

## 2014-10-10 ENCOUNTER — Ambulatory Visit: Payer: Medicare Other | Admitting: Nurse Practitioner

## 2014-10-21 ENCOUNTER — Telehealth: Payer: Self-pay | Admitting: Nurse Practitioner

## 2014-10-21 NOTE — Telephone Encounter (Signed)
The patient has been scheduled at [facility] . She is aware of this appointment on 3.24.16 @ 8:20.

## 2014-10-31 ENCOUNTER — Ambulatory Visit: Payer: Self-pay | Admitting: Nurse Practitioner

## 2014-10-31 IMAGING — MG MM DIGITAL SCREENING BILAT W/ CAD
1 series · 4 of 4 positions shown · non-contrast
Comparison: Previous exam(s).

CLINICAL DATA: Screening.

EXAM:
DIGITAL SCREENING BILATERAL MAMMOGRAM WITH CAD

[R CC · right · 4 of 4 slices shown]
[im 1/4]
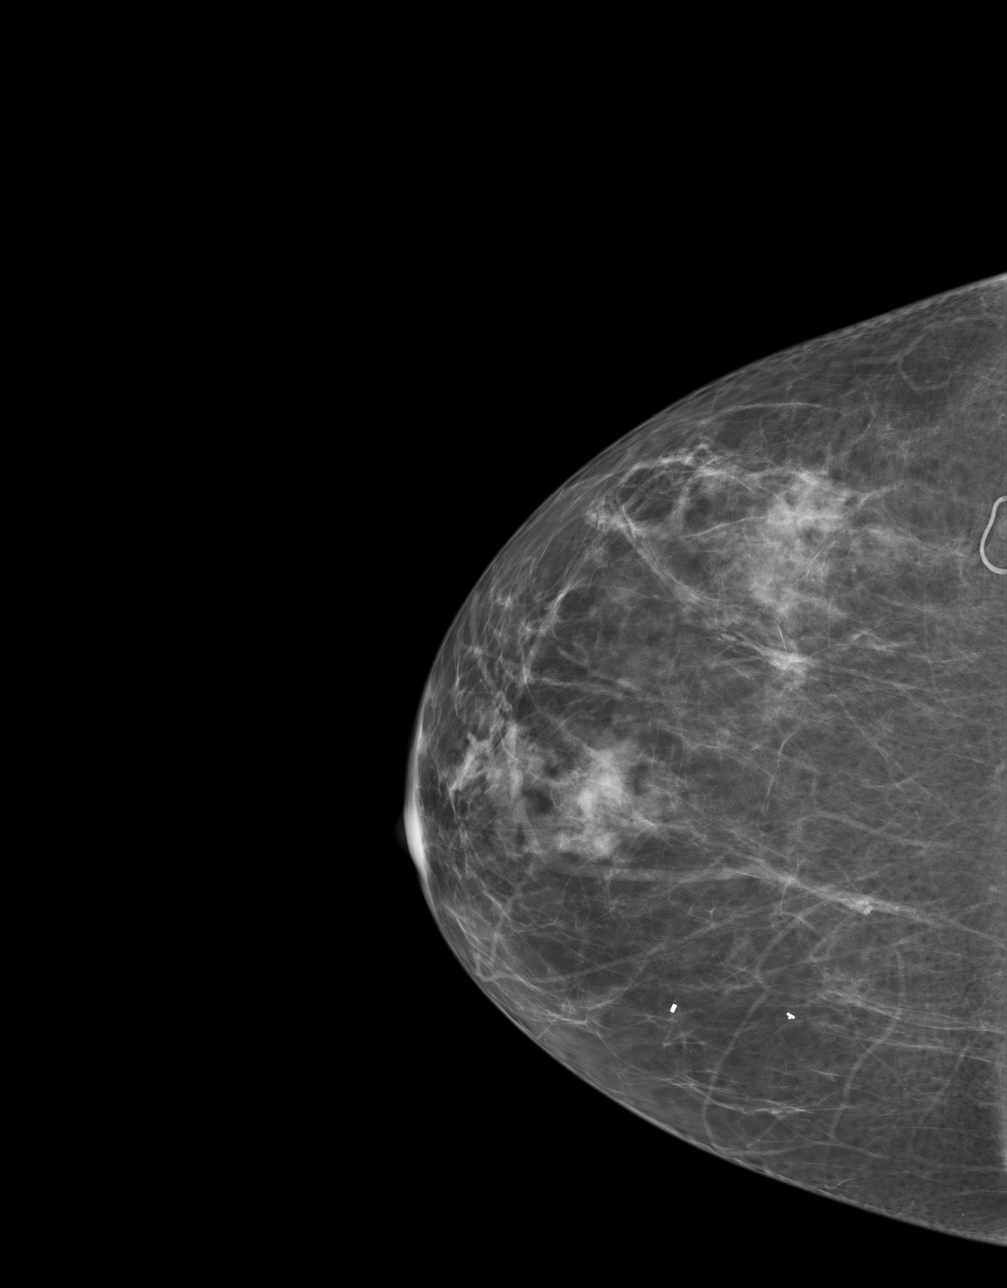
[im 2/4]
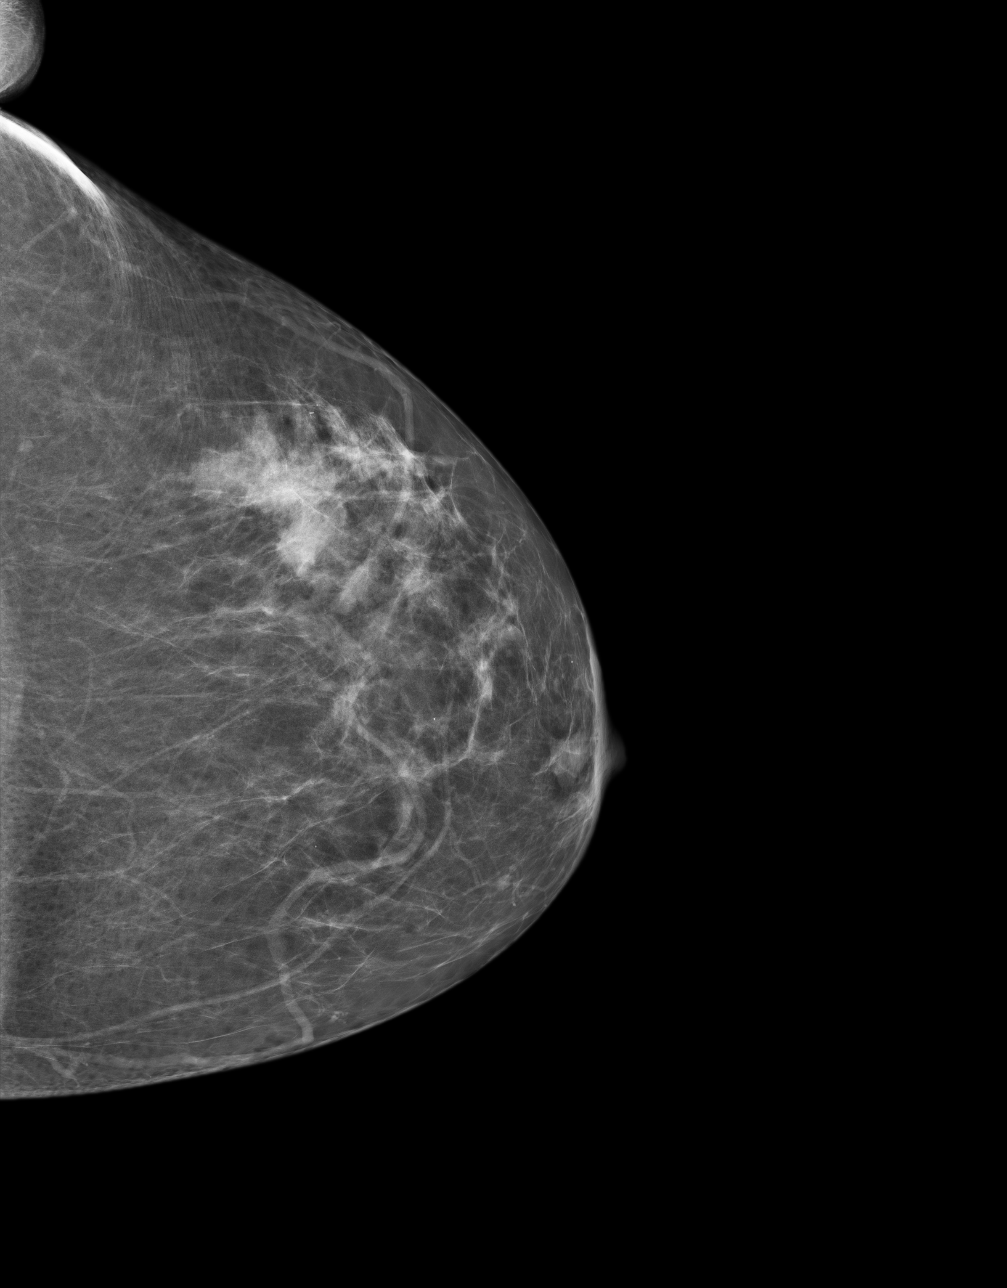
[im 3/4]
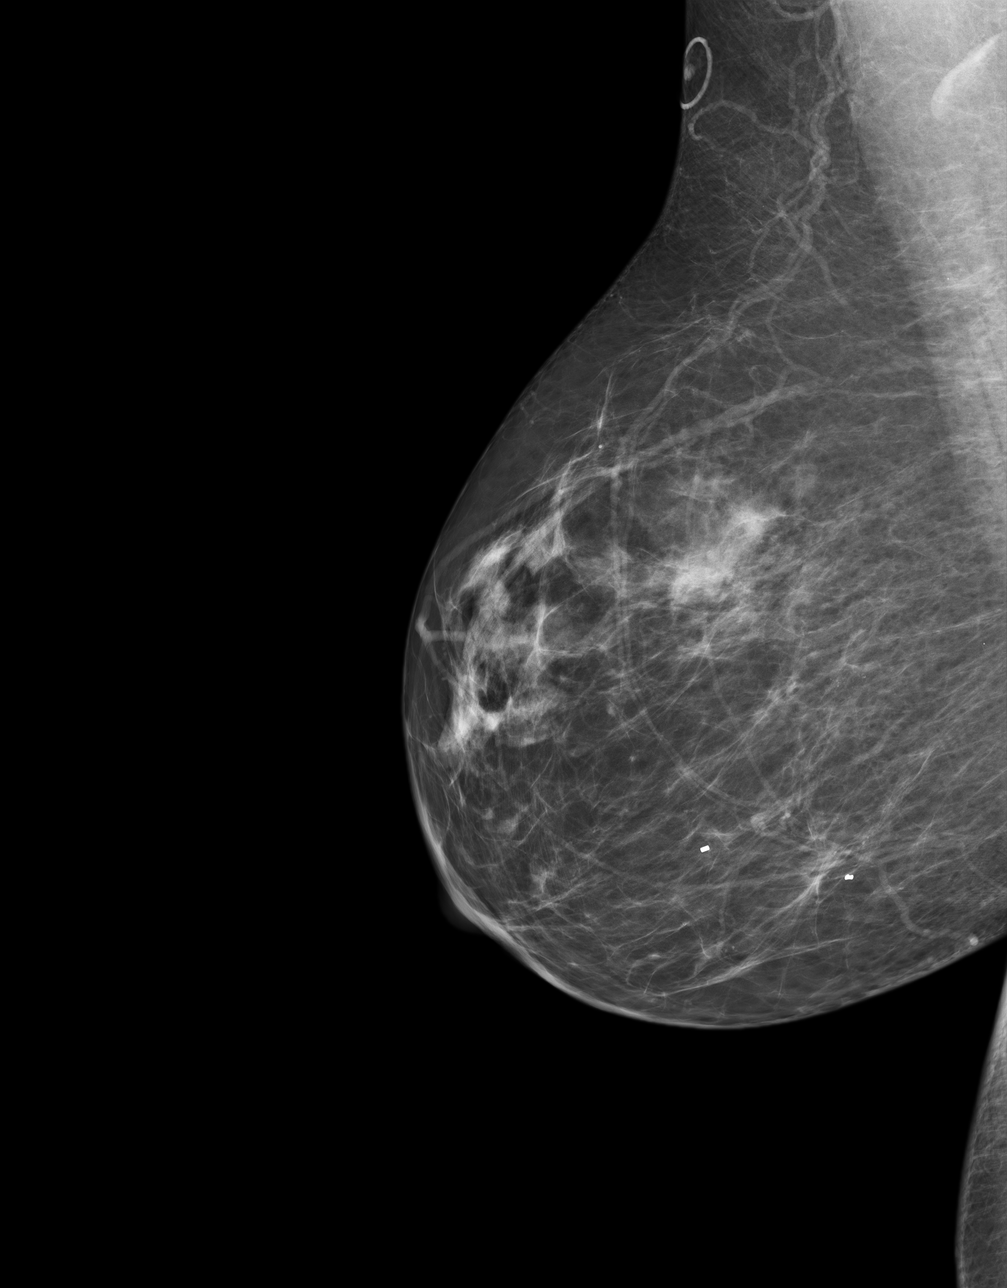
[im 4/4]
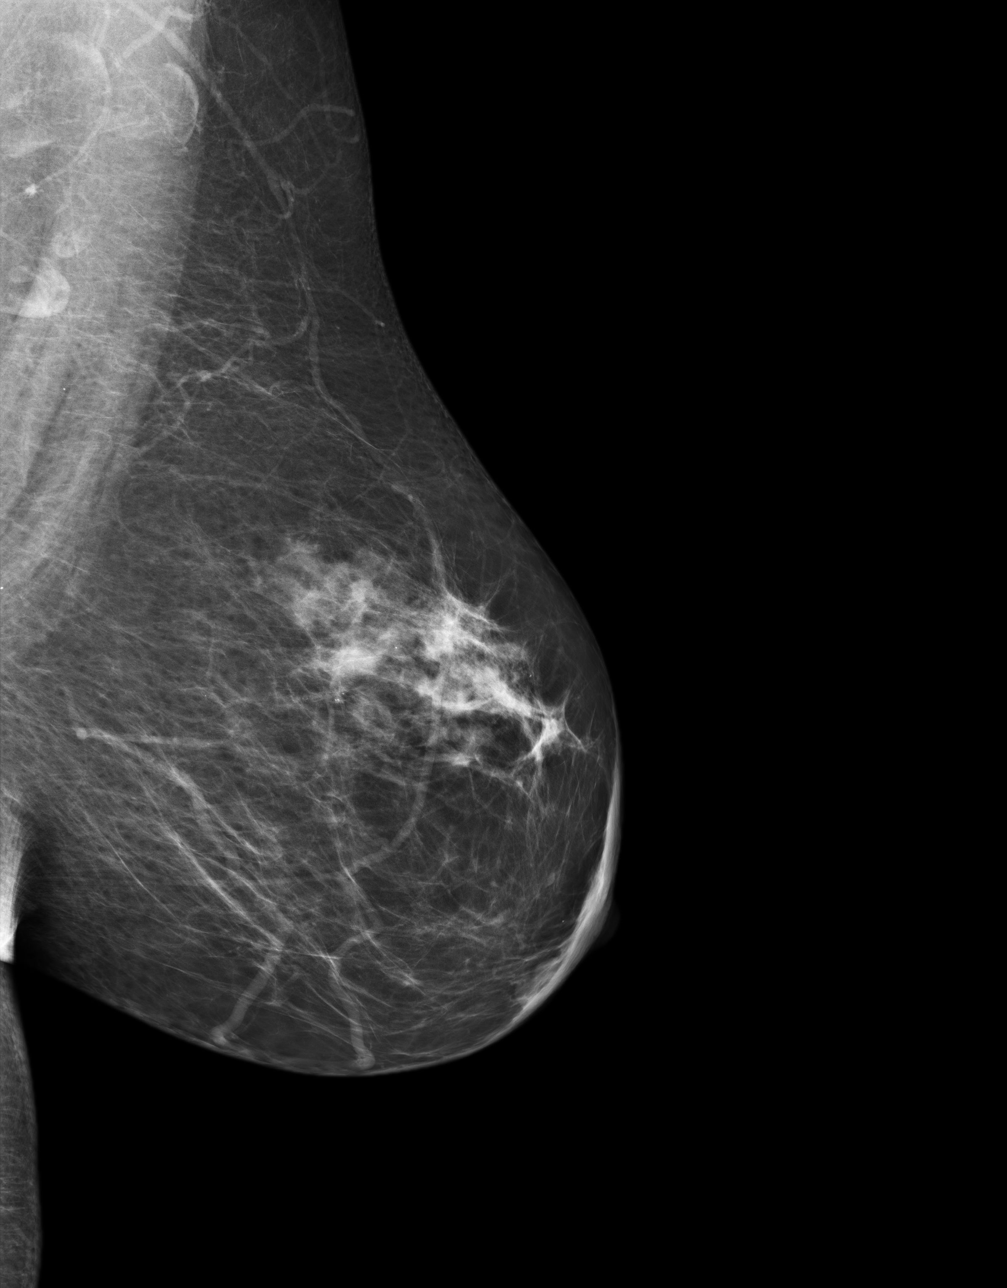

[4 of 4 positions shown; findings below may reference images not displayed]

ACR Breast Density Category c: The breast tissue is heterogeneously
dense, which may obscure small masses.
FINDINGS: There are no findings suspicious for malignancy. Images were
processed with CAD.
IMPRESSION: No mammographic evidence of malignancy. A result letter of this
screening mammogram will be mailed directly to the patient.

RECOMMENDATION:
Screening mammogram in one year. (Code:[0J])

BI-RADS CATEGORY  1: Negative.

## 2015-02-11 ENCOUNTER — Telehealth: Payer: Self-pay

## 2015-02-11 NOTE — Telephone Encounter (Signed)
Called and left message for patient to make a f/u A1C appointment.  Last A1C lab completed 10/18/13.

## 2015-02-25 ENCOUNTER — Telehealth: Payer: Self-pay | Admitting: *Deleted

## 2015-02-25 ENCOUNTER — Other Ambulatory Visit (INDEPENDENT_AMBULATORY_CARE_PROVIDER_SITE_OTHER): Payer: BC Managed Care – PPO

## 2015-02-25 DIAGNOSIS — E119 Type 2 diabetes mellitus without complications: Secondary | ICD-10-CM | POA: Diagnosis not present

## 2015-02-25 LAB — HEMOGLOBIN A1C: Hgb A1c MFr Bld: 6.3 % (ref 4.6–6.5)

## 2015-02-25 NOTE — Telephone Encounter (Signed)
A1c- diabetes type II controlled

## 2015-02-25 NOTE — Telephone Encounter (Signed)
Labs and dx?  

## 2015-02-27 ENCOUNTER — Encounter: Payer: Self-pay | Admitting: Nurse Practitioner

## 2015-02-27 ENCOUNTER — Ambulatory Visit (INDEPENDENT_AMBULATORY_CARE_PROVIDER_SITE_OTHER): Payer: BC Managed Care – PPO | Admitting: Nurse Practitioner

## 2015-02-27 VITALS — BP 118/80 | HR 70 | Temp 97.9°F | Resp 16 | Ht 64.0 in | Wt 203.4 lb

## 2015-02-27 DIAGNOSIS — M25571 Pain in right ankle and joints of right foot: Secondary | ICD-10-CM | POA: Insufficient documentation

## 2015-02-27 DIAGNOSIS — E669 Obesity, unspecified: Secondary | ICD-10-CM

## 2015-02-27 DIAGNOSIS — E119 Type 2 diabetes mellitus without complications: Secondary | ICD-10-CM

## 2015-02-27 DIAGNOSIS — J324 Chronic pansinusitis: Secondary | ICD-10-CM | POA: Diagnosis not present

## 2015-02-27 MED ORDER — KETOCONAZOLE 2 % EX CREA: 1.0000 "application " | TOPICAL_CREAM | Freq: Every day | CUTANEOUS | Status: DC

## 2015-02-27 MED ORDER — PHENTERMINE HCL 37.5 MG PO TABS: 37.5000 mg | ORAL_TABLET | Freq: Every day | ORAL | Status: DC

## 2015-02-27 NOTE — Progress Notes (Signed)
   Subjective:    Patient ID: Kathy Bryant, female    DOB: [birth year], 69 y.o.   MRN: [id number]  HPI  [name] is a 69 yo female with a follow up of sinusitis.   1) Sinusitis- improved, summer time has been good for her sinus troubles.   2) Right ankle- Heel is painful after pulling it in Sept., tender when stepping the wrong then it is painful to walk on, worse in spring, doesn't   Ibuprofen- not helpful   3) Started as red bump- getting crusty, keeps getting bigger  Elongates along a specific line under her jaw   4) Pre-diabetes- 6.3 A1c, this is not taking medications   5) Weight gain- Pt reports unable to lose weight     Weight watchers- 2014 did not find it helpful   Green tea- helpful somewhat   Walking- unable to currently walk   [employer] at [employer]- 5 days a week cardio   equipment   Review of Systems  Constitutional: Positive for activity change. Negative for fever, chills, diaphoresis, appetite change, fatigue and unexpected weight change.  Respiratory: Negative for chest tightness, shortness of breath and wheezing.   Cardiovascular: Negative for chest pain, palpitations and leg swelling.  Gastrointestinal: Negative for nausea, vomiting and diarrhea.  Musculoskeletal: Positive for arthralgias.  Skin: Positive for rash.  Neurological: Negative for dizziness, weakness, numbness and headaches.  Psychiatric/Behavioral: The patient is not nervous/anxious.       Objective:   Physical Exam  Constitutional: She is oriented to person, place, and time. She appears well-developed and well-nourished. No distress.  BP 118/80 mmHg  Pulse 70  Temp(Src) 97.9 F (36.6 C)  Resp 16  Ht 5\' 4"  (1.626 m)  Wt 203 lb 6.4 oz (92.262 kg)  BMI 34.90 kg/m2  SpO2 96%   HENT:  Head: Normocephalic and atraumatic.    Right Ear: External ear normal.  Left Ear: External ear normal.  Cardiovascular: Normal rate, regular rhythm and normal heart sounds.  Exam reveals no  gallop and no friction rub.   No murmur heard. Pulmonary/Chest: Effort normal and breath sounds normal. No respiratory distress. She has no wheezes. She has no rales. She exhibits no tenderness.  Neurological: She is alert and oriented to person, place, and time. No cranial nerve deficit. She exhibits normal muscle tone. Coordination normal.  Skin: Skin is warm and dry. No rash noted. She is not diaphoretic.  Psychiatric: She has a normal mood and affect. Her behavior is normal. Judgment and thought content normal.      Assessment & Plan:

## 2015-02-27 NOTE — Assessment & Plan Note (Signed)
Improved

## 2015-02-27 NOTE — Progress Notes (Signed)
Pre visit review using our clinic review tool, if applicable. No additional management support is needed unless otherwise documented below in the visit note. 

## 2015-02-27 NOTE — Assessment & Plan Note (Signed)
6.3 w/ out metformin. Pt controlled and working on diet and exercise.

## 2015-02-27 NOTE — Patient Instructions (Addendum)
Come for a nurse visit to check your BP and Pulse 1-2 weeks after starting your phentermine.  (no-copay)    I have written a prescription for  phentermine for 1 month.   Please have your vital signs checked a week after starting and let me know the results.  I will need those measurements, or you can return for an RN visit, before I can refill the phentermine for additional months.  If pulse is > 100 or BP is > 140/90 we should repeat your evaluation after being off  of phentermine for a few days.  If both are below those parameters, You can continue the medication and return to see me in 3 months.  Most people take 1/2 tablet in the morning,  The second half by 2 PM to avoid insomnia.   If you have not lost 10 lbs (which is 5% of your starting weight)  by the end of the  3 months, the risks of continuing the medication outweigh the benefits and  we will have to discontinue it and find a Plan B .

## 2015-02-27 NOTE — Assessment & Plan Note (Signed)
Will send to sports medicine. Probable tendinitis. Rest, ice, stretches.

## 2015-02-27 NOTE — Assessment & Plan Note (Signed)
Will try phentermine. Pt has failed other methods and reports curbing her appetite would be helpful. FU prn worsening/failure to improve.   Needs to follow up in 1-2 weeks after starting for a BP/Pulse. Then will need 2 more months of phentermine.

## 2015-03-19 ENCOUNTER — Telehealth: Payer: Self-pay | Admitting: Nurse Practitioner

## 2015-04-24 ENCOUNTER — Telehealth: Payer: Self-pay | Admitting: *Deleted

## 2015-04-24 NOTE — Telephone Encounter (Signed)
Pt called requesting Phentermine refill.  Last OV 7.21.16.  Please advise refill

## 2015-04-25 NOTE — Telephone Encounter (Signed)
I need a pulse and blood pressure from her before she gets refills. Thanks!

## 2015-04-27 ENCOUNTER — Other Ambulatory Visit: Payer: Self-pay | Admitting: Nurse Practitioner

## 2015-04-27 NOTE — Telephone Encounter (Signed)
Okay to refill? Last seen on 02/27/15

## 2015-04-28 NOTE — Telephone Encounter (Signed)
Left message on VM.

## 2015-04-30 ENCOUNTER — Other Ambulatory Visit: Payer: Self-pay | Admitting: Nurse Practitioner

## 2015-04-30 ENCOUNTER — Ambulatory Visit (INDEPENDENT_AMBULATORY_CARE_PROVIDER_SITE_OTHER): Payer: BC Managed Care – PPO

## 2015-04-30 VITALS — BP 114/78 | HR 73 | Resp 18

## 2015-04-30 DIAGNOSIS — E669 Obesity, unspecified: Secondary | ICD-10-CM | POA: Diagnosis not present

## 2015-04-30 MED ORDER — PHENTERMINE HCL 37.5 MG PO TABS: 37.5000 mg | ORAL_TABLET | Freq: Every day | ORAL | Status: DC

## 2015-04-30 NOTE — Progress Notes (Signed)
Note reviewed. I agree with following assessment. Gave 2 more months of phentermine to M. Steelman to fax today. Pt is stable on intermittent BP medication use. Pt can choose to stay off. Doing well with 6 lb weight loss.   Lorane Gell, NP 04/30/15 9:30 am

## 2015-04-30 NOTE — Progress Notes (Signed)
Patient came in for BP check due to phentermine medication.  Vitals documented.  Patient concerned about weight loss due to medication from the orthopedic for her achilles tendon.  Patient states she has lost 6 lbs since last OV on 7/21/  Patient has not been taking BP medication regularly.  Is returning to the orthopedic tomorrow for follow up.  Please advise?

## 2015-05-20 ENCOUNTER — Other Ambulatory Visit: Payer: Self-pay

## 2015-05-20 MED ORDER — LISINOPRIL 2.5 MG PO TABS: 2.5000 mg | ORAL_TABLET | Freq: Every day | ORAL | Status: DC

## 2015-06-12 ENCOUNTER — Telehealth (HOSPITAL_COMMUNITY): Payer: Self-pay

## 2015-06-12 NOTE — Telephone Encounter (Signed)
Patient would like his reacquisition for

## 2015-10-06 ENCOUNTER — Other Ambulatory Visit: Payer: Self-pay | Admitting: Nurse Practitioner

## 2015-10-06 MED ORDER — ALPRAZOLAM 0.25 MG PO TABS: 0.2500 mg | ORAL_TABLET | Freq: Two times a day (BID) | ORAL | Status: DC | PRN

## 2016-12-14 NOTE — Progress Notes (Signed)
Subjective:    Patient ID: Kathy Bryant, female    DOB: 1946/06/28, 71 y.o.   MRN: [id number]  CC: LAKYN Kathy Bryant is a 71 y.o. female who presents today for an acute visit.    HPI: CC: hemorrhoids x 6 days, reports clear discharge from hemorrhoids. No blood.   No abdominal pain, fever, constipation. NO straining.  Tried preparation H with some relief.   Colonoscopy UTD  Anxiety- Takes xanax 2x per month 'at most.'  Would like refill. No depression.         HISTORY:  Past Medical History:  Diagnosis Date  . Diabetes mellitus without complication   . Fatty liver   . Herniated disc, cervical   . HTN (hypertension)   . Microscopic hematuria   . Obesity    Past Surgical History:  Procedure Laterality Date  . APPENDECTOMY  1968  . BREAST BIOPSY Right 2014   benign  . [place]  . [place]  . HERNIA REPAIR     Abdominal  . TOTAL ABDOMINAL HYSTERECTOMY W/ BILATERAL SALPINGOOPHORECTOMY  1998   Family History  Problem Relation Age of Onset  . Diabetes Mother   . Cancer Mother 31    Brain Tumor - died age 8  . Heart disease Father     CAD - died MI - age 57  . Hypertension Father   . Diabetes Sister   . Diabetes Brother   . Diabetes Brother   . Cancer Paternal Aunt     Renal Cell Ca  . Cancer Paternal Uncle     Renal Cell Ca    Allergies: Patient has no known allergies. No current outpatient prescriptions on file prior to visit.   No current facility-administered medications on file prior to visit.     Social History  Substance Use Topics  . Smoking status: Never Smoker  . Smokeless tobacco: Not on file  . Alcohol use Yes     Comment: Occasional glass of wine    Review of Systems  Constitutional: Negative for chills and fever.  Respiratory: Negative for cough.   Cardiovascular: Negative for chest pain and palpitations.  Gastrointestinal: Negative for abdominal pain, anal bleeding, blood in stool,  constipation, diarrhea, nausea and vomiting.      Objective:    BP 134/76   Pulse 81   Temp 97.8 F (36.6 C) (Oral)   Ht 5\' 4"  (1.626 m)   Wt 189 lb 12.8 oz (86.1 kg)   SpO2 97%   BMI 32.58 kg/m    Physical Exam  Constitutional: She appears well-developed and well-nourished.  Eyes: Conjunctivae are normal.  Cardiovascular: Normal rate, regular rhythm, normal heart sounds and normal pulses.   Pulmonary/Chest: Effort normal and breath sounds normal. She has no wheezes. She has no rhonchi. She has no rales.  Genitourinary:     Genitourinary Comments: Single, large, tender, skin-colored prolapsed internal hemorrhoids and skin tags noted around anus. No purple discoloration or bleeding noted. No thrombus appreciated in hemorrhoid.   Neurological: She is alert.  Skin: Skin is warm and dry.  Psychiatric: She has a normal mood and affect. Her speech is normal and behavior is normal. Thought content normal.  Vitals reviewed.      Assessment & Plan:   Problem List Items Addressed This Visit      Cardiovascular and Mediastinum   Hemorrhoid - Primary    Does not appear thrombosed. Based on size and tenderness  of hemorrhoid, patient and I jointly decided appropriate next step is referral to GI. Cream given to use in the interim.       Relevant Medications   hydrocortisone (ANUSOL-HC) 2.5 % rectal cream   Other Relevant Orders   Ambulatory referral to Gastroenterology     Other   Anxiety    Controlled. Using very sparingly. Patient understands risks of this medication. Refilled.      Relevant Medications   ALPRAZolam (XANAX) 0.25 MG tablet        I have discontinued Kathy Bryant's lovastatin, ketoconazole, phentermine, and lisinopril. I am also having her start on hydrocortisone. Additionally, I am having her maintain her ALPRAZolam.   Meds ordered this encounter  Medications  . ALPRAZolam (XANAX) 0.25 MG tablet    Sig: Take 1 tablet (0.25 mg total) by mouth 2 (two)  times daily as needed for anxiety.    Dispense:  60 tablet    Refill:  0    Order Specific Question:   Supervising Provider    Answer:   Deborra Medina L [[id number]]  . hydrocortisone (ANUSOL-HC) 2.5 % rectal cream    Sig: Place 1 application rectally 2 (two) times daily.    Dispense:  30 g    Refill:  1    Order Specific Question:   Supervising Provider    Answer:   Crecencio Mc [[id number]]    Return precautions given.   Risks, benefits, and alternatives of the medications and treatment plan prescribed today were discussed, and patient expressed understanding.   Education regarding symptom management and diagnosis given to patient on AVS.  Continue to follow with Burnard Hawthorne, FNP for routine health maintenance.   Kathy Bryant and I agreed with plan.   [name], FNP

## 2016-12-15 ENCOUNTER — Ambulatory Visit (INDEPENDENT_AMBULATORY_CARE_PROVIDER_SITE_OTHER): Payer: BC Managed Care – PPO | Admitting: Family

## 2016-12-15 ENCOUNTER — Telehealth: Payer: Self-pay | Admitting: *Deleted

## 2016-12-15 ENCOUNTER — Encounter: Payer: Self-pay | Admitting: Family

## 2016-12-15 VITALS — BP 134/76 | HR 81 | Temp 97.8°F | Ht 64.0 in | Wt 189.8 lb

## 2016-12-15 DIAGNOSIS — F419 Anxiety disorder, unspecified: Secondary | ICD-10-CM | POA: Diagnosis not present

## 2016-12-15 DIAGNOSIS — K649 Unspecified hemorrhoids: Secondary | ICD-10-CM | POA: Diagnosis not present

## 2016-12-15 MED ORDER — ALPRAZOLAM 0.25 MG PO TABS: 0.2500 mg | ORAL_TABLET | Freq: Two times a day (BID) | ORAL | 0 refills | Status: DC | PRN

## 2016-12-15 MED ORDER — HYDROCORTISONE 2.5 % RE CREA: 1.0000 "application " | TOPICAL_CREAM | Freq: Two times a day (BID) | RECTAL | 1 refills | Status: DC

## 2016-12-15 NOTE — Progress Notes (Signed)
Pre visit review using our clinic review tool, if applicable. No additional management support is needed unless otherwise documented below in the visit note. 

## 2016-12-15 NOTE — Patient Instructions (Signed)
Ensure you do not strain to have a bowel movement- may use Colace ( stool softener)  hemorrhoidal cream  Referral to GI  Follow up with physical and fasting labs   Hemorrhoids Hemorrhoids are swollen veins in and around the rectum or anus. There are two types of hemorrhoids:  Internal hemorrhoids. These occur in the veins that are just inside the rectum. They may poke through to the outside and become irritated and painful.  External hemorrhoids. These occur in the veins that are outside of the anus and can be felt as a painful swelling or hard lump near the anus. Most hemorrhoids do not cause serious problems, and they can be managed with home treatments such as diet and lifestyle changes. If home treatments do not help your symptoms, procedures can be done to shrink or remove the hemorrhoids. What are the causes? This condition is caused by increased pressure in the anal area. This pressure may result from various things, including:  Constipation.  Straining to have a bowel movement.  Diarrhea.  Pregnancy.  Obesity.  Sitting for long periods of time.  Heavy lifting or other activity that causes you to strain.  Anal sex. What are the signs or symptoms? Symptoms of this condition include:  Pain.  Anal itching or irritation.  Rectal bleeding.  Leakage of stool (feces).  Anal swelling.  One or more lumps around the anus. How is this diagnosed? This condition can often be diagnosed through a visual exam. Other exams or tests may also be done, such as:  Examination of the rectal area with a gloved hand (digital rectal exam).  Examination of the anal canal using a small tube (anoscope).  A blood test, if you have lost a significant amount of blood.  A test to look inside the colon (sigmoidoscopy or colonoscopy). How is this treated? This condition can usually be treated at home. However, various procedures may be done if dietary changes, lifestyle changes, and  other home treatments do not help your symptoms. These procedures can help make the hemorrhoids smaller or remove them completely. Some of these procedures involve surgery, and others do not. Common procedures include:  Rubber band ligation. Rubber bands are placed at the base of the hemorrhoids to cut off the blood supply to them.  Sclerotherapy. Medicine is injected into the hemorrhoids to shrink them.  Infrared coagulation. A type of light energy is used to get rid of the hemorrhoids.  Hemorrhoidectomy surgery. The hemorrhoids are surgically removed, and the veins that supply them are tied off.  Stapled hemorrhoidopexy surgery. A circular stapling device is used to remove the hemorrhoids and use staples to cut off the blood supply to them. Follow these instructions at home: Eating and drinking   Eat foods that have a lot of fiber in them, such as whole grains, beans, nuts, fruits, and vegetables. Ask your health care provider about taking products that have added fiber (fiber supplements).  Drink enough fluid to keep your urine clear or pale yellow. Managing pain and swelling   Take warm sitz baths for 20 minutes, 3-4 times a day to ease pain and discomfort.  If directed, apply ice to the affected area. Using ice packs between sitz baths may be helpful.  Put ice in a plastic bag.  Place a towel between your skin and the bag.  Leave the ice on for 20 minutes, 2-3 times a day. General instructions   Take over-the-counter and prescription medicines only as told by your health  care provider.  Use medicated creams or suppositories as told.  Exercise regularly.  Go to the bathroom when you have the urge to have a bowel movement. Do not wait.  Avoid straining to have bowel movements.  Keep the anal area dry and clean. Use wet toilet paper or moist towelettes after a bowel movement.  Do not sit on the toilet for long periods of time. This increases blood pooling and  pain. Contact a health care provider if:  You have increasing pain and swelling that are not controlled by treatment or medicine.  You have uncontrolled bleeding.  You have difficulty having a bowel movement, or you are unable to have a bowel movement.  You have pain or inflammation outside the area of the hemorrhoids. This information is not intended to replace advice given to you by your health care provider. Make sure you discuss any questions you have with your health care provider. Document Released: 07/23/2000 Document Revised: 12/24/2015 Document Reviewed: 04/09/2015 Elsevier Interactive Patient Education  2017 [employer].

## 2016-12-15 NOTE — Assessment & Plan Note (Signed)
Does not appear thrombosed. Based on size and tenderness of hemorrhoid, patient and I jointly decided appropriate next step is referral to GI. Cream given to use in the interim.

## 2016-12-15 NOTE — Assessment & Plan Note (Signed)
Controlled. Using very sparingly. Patient understands risks of this medication. Refilled.

## 2016-12-15 NOTE — Telephone Encounter (Signed)
Patient stated that the gastrologist she was referred to will not remove her Hemorid,they only treat hemorrhoids. If they have to be removed, she will need to be referred to a surgeon.  Pt contact [phone]

## 2016-12-16 ENCOUNTER — Encounter: Payer: Self-pay | Admitting: Nurse Practitioner

## 2016-12-16 NOTE — Telephone Encounter (Signed)
Pt called back being very rude that no one has called her back in regards to this. Please call patient.

## 2016-12-16 NOTE — Telephone Encounter (Signed)
Pt called back in regards to this. Pt would like to speak to M. Arnett directly to get this clarified. Please advise, thank you!  Call pt @ [phone]

## 2016-12-16 NOTE — Telephone Encounter (Signed)
Kathy Bryant,  Here is other patient who needs Psychologist, sport and exercise.   Referral placed. Please call patient with an appointment.   Kathy Bryant, Please call pt and let her know our referral coordinator is working on her appointment. GI does not remove hemorrhoids which we did not know. We will send her to a surgeon for them to be removed.   We will call her promptly with an appointment.   Sorry for the confusion.

## 2016-12-16 NOTE — Telephone Encounter (Signed)
Spoken to patient. She has been notified. She stated she is canceling her appointment with GI since they will not be removing the hemorrhoids

## 2016-12-17 ENCOUNTER — Telehealth: Payer: Self-pay | Admitting: General Surgery

## 2016-12-17 NOTE — Telephone Encounter (Signed)
Left message for patient to call & move time up on Monday 12-20-16 or to another day with Dr Tamera Reason

## 2016-12-20 ENCOUNTER — Ambulatory Visit: Payer: Self-pay | Admitting: General Surgery

## 2016-12-20 ENCOUNTER — Encounter: Payer: Self-pay | Admitting: General Surgery

## 2016-12-20 ENCOUNTER — Ambulatory Visit (INDEPENDENT_AMBULATORY_CARE_PROVIDER_SITE_OTHER): Payer: BC Managed Care – PPO | Admitting: General Surgery

## 2016-12-20 ENCOUNTER — Telehealth: Payer: Self-pay | Admitting: *Deleted

## 2016-12-20 VITALS — BP 122/78 | HR 88 | Resp 12 | Ht 64.0 in | Wt 188.0 lb

## 2016-12-20 DIAGNOSIS — K648 Other hemorrhoids: Secondary | ICD-10-CM

## 2016-12-20 DIAGNOSIS — K644 Residual hemorrhoidal skin tags: Secondary | ICD-10-CM

## 2016-12-20 NOTE — Addendum Note (Signed)
Addended by: Christene Lye on: 12/20/2016 02:50 PM   Modules accepted: Orders, SmartSet

## 2016-12-20 NOTE — Telephone Encounter (Signed)
Patient called the office back to report that she would like to proceed with surgery.   This patient's hemorrhoidectomy has been scheduled for 12-23-16 at [facility].

## 2016-12-20 NOTE — Progress Notes (Signed)
Patient ID: Kathy Bryant, female   DOB: 09-Feb-1946, 71 y.o.   MRN: [id number]  Chief Complaint  Patient presents with  . Other    HPI Kathy Bryant is a 71 y.o. female here today for a evaluation of hemorrhoids. Patient states last Friday she started having some pain. She noticed bleeding on the paper and some discharge from the rectal area. Last colonoscopy 03/05/2014- 2 polyps removed-tubular adenomas HPI  Past Medical History:  Diagnosis Date  . Diabetes mellitus without complication ([place])   . Fatty liver   . Herniated disc, cervical   . HTN (hypertension)   . Microscopic hematuria   . Obesity     Past Surgical History:  Procedure Laterality Date  . APPENDECTOMY  1968  . BREAST BIOPSY Right 2014   benign  . [place]  . COLONOSCOPY  03/05/2014  . [place]  . HERNIA REPAIR     Abdominal  . TOTAL ABDOMINAL HYSTERECTOMY W/ BILATERAL SALPINGOOPHORECTOMY  1998    Family History  Problem Relation Age of Onset  . Diabetes Mother   . Cancer Mother 77       Brain Tumor - died age 38  . Heart disease Father        CAD - died MI - age 54  . Hypertension Father   . Diabetes Sister   . Diabetes Brother   . Diabetes Brother   . Cancer Paternal Aunt        Renal Cell Ca  . Cancer Paternal Uncle        Renal Cell Ca    Social History Social History  Substance Use Topics  . Smoking status: Never Smoker  . Smokeless tobacco: Never Used  . Alcohol use Yes     Comment: Occasional glass of wine    No Known Allergies  Current Outpatient Prescriptions  Medication Sig Dispense Refill  . ALPRAZolam (XANAX) 0.25 MG tablet Take 1 tablet (0.25 mg total) by mouth 2 (two) times daily as needed for anxiety. 60 tablet 0  . hydrocortisone (ANUSOL-HC) 2.5 % rectal cream Place 1 application rectally 2 (two) times daily. 30 g 1   No current facility-administered medications for this visit.     Review of Systems Review of Systems   Constitutional: Negative.   Respiratory: Negative.   Cardiovascular: Negative.   Gastrointestinal: Negative.     Blood pressure 122/78, pulse 88, resp. rate 12, height 5\' 4"  (1.626 m), weight 188 lb (85.3 kg).  Physical Exam Physical Exam  Constitutional: She is oriented to person, place, and time. She appears well-developed and well-nourished.  Eyes: Conjunctivae are normal. No scleral icterus.  Neck: Neck supple.  Cardiovascular: Normal rate, regular rhythm and normal heart sounds.   Pulmonary/Chest: Effort normal and breath sounds normal.  Abdominal: Soft. Normal appearance and bowel sounds are normal. There is no hepatomegaly. There is no tenderness. No hernia.  Genitourinary: Rectal exam shows external hemorrhoid and internal hemorrhoid.     Lymphadenopathy:    She has no cervical adenopathy.  Neurological: She is alert and oriented to person, place, and time.  Skin: Skin is warm and dry.  With pt consent, anoscopy was done. In addition to the prominent internal hemorrhoid at 3 ocl, there are a couple of other small one on right side  Data Reviewed Notes reviewed   Assessment    Ext and int hemorrhoids    Plan   Discussed option of banding the  large internal hemorrhoid or consider hemorrhoidectomy of all hemorrhoids.  Procedures, risks and benefits explained. She will think about it and decide.. Follow up appointment to be announced.    HPI, Physical Exam, Assessment and Plan have been scribed under the direction and in the presence of Mckinley Jewel, MD  Gaspar Cola, CMA    I have completed the exam and reviewed the above documentation for accuracy and completeness.  I agree with the above.  Haematologist has been used and any errors in dictation or transcription are unintentional.  Seeplaputhur G. Jamal Collin, M.D., F.A.C.S.    Junie Panning G 12/20/2016, 2:02 PM

## 2016-12-22 ENCOUNTER — Encounter
Admission: RE | Admit: 2016-12-22 | Discharge: 2016-12-22 | Disposition: A | Discharge status: Home / Self Care | Payer: BC Managed Care – PPO | Source: Ambulatory Visit | Attending: General Surgery | Admitting: General Surgery

## 2016-12-22 HISTORY — DX: Anxiety disorder, unspecified: F41.9

## 2016-12-22 HISTORY — DX: Inflammatory liver disease, unspecified: K75.9

## 2016-12-22 NOTE — Patient Instructions (Signed)
  Your procedure is scheduled on: 12-23-16 Report to Same Day Surgery 2nd floor medical mall [facility] Entrance-take elevator on left to 2nd floor.  Check in with surgery information desk.) To find out your arrival time please call [phone] between 1PM - 3PM on 12-22-16  Remember: Instructions that are not followed completely may result in serious medical risk, up to and including death, or upon the discretion of your surgeon and anesthesiologist your surgery may need to be rescheduled.    _x___ 1. Do not eat food or drink liquids after midnight. No gum chewing or hard candies.     __x__ 2. No Alcohol for 24 hours before or after surgery.   __x__3. No Smoking for 24 prior to surgery.   ____  4. Bring all medications with you on the day of surgery if instructed.    __x__ 5. Notify your doctor if there is any change in your medical condition     (cold, fever, infections).     Do not wear jewelry, make-up, hairpins, clips or nail polish.  Do not wear lotions, powders, or perfumes. You may wear deodorant.  Do not shave 48 hours prior to surgery. Men may shave face and neck.  Do not bring valuables to the hospital.    [facility] is not responsible for any belongings or valuables.               Contacts, dentures or bridgework may not be worn into surgery.  Leave your suitcase in the car. After surgery it may be brought to your room.  For patients admitted to the hospital, discharge time is determined by your treatment team.   Patients discharged the day of surgery will not be allowed to drive home.  You will need someone to drive you home and stay with you the night of your procedure.    Please read over the following fact sheets that you were given:     ____ Take anti-hypertensive (unless it includes a diuretic), cardiac, seizure, asthma,     anti-reflux and psychiatric medicines. These include:  1. MAY TAKE XANAX IF NEEDED WITH A SMALL SIP OF  WATER  2.  3.  4.  5.  6.  _X___Fleets enema or Magnesium Citrate as directed-AT HOME 1 HOUR PRIOR TO ARRIVAL TIME TO HOSPITAL   ____ Use CHG Soap or sage wipes as directed on instruction sheet   ____ Use inhalers on the day of surgery and bring to hospital day of surgery  ____ Stop Metformin and Janumet 2 days prior to surgery.    ____ Take 1/2 of usual insulin dose the night before surgery and none on the morning     surgery.   ____ Follow recommendations from Cardiologist, Pulmonologist or PCP regarding          stopping Aspirin, Coumadin, Pllavix ,Eliquis, Effient, or Pradaxa, and Pletal.  X____Stop Anti-inflammatories such as Advil, Aleve, IBUPROEEN, Motrin, Naproxen, Naprosyn, Goodies powders or aspirin products NOW- OK to take Tylenol   ____ Stop supplements until after surgery.    ____ Bring C-Pap to the hospital.

## 2016-12-23 ENCOUNTER — Ambulatory Visit: Payer: BC Managed Care – PPO | Admitting: Certified Registered Nurse Anesthetist

## 2016-12-23 ENCOUNTER — Ambulatory Visit
Admission: RE | Admit: 2016-12-23 | Discharge: 2016-12-23 | Disposition: A | Discharge status: Home / Self Care | Payer: BC Managed Care – PPO | Source: Ambulatory Visit | Attending: General Surgery | Admitting: General Surgery

## 2016-12-23 ENCOUNTER — Encounter: Payer: Self-pay | Admitting: *Deleted

## 2016-12-23 ENCOUNTER — Encounter
Admission: RE | Disposition: A | Discharge status: Home / Self Care | Payer: Self-pay | Source: Ambulatory Visit | Attending: General Surgery

## 2016-12-23 DIAGNOSIS — E119 Type 2 diabetes mellitus without complications: Secondary | ICD-10-CM | POA: Insufficient documentation

## 2016-12-23 DIAGNOSIS — Z8249 Family history of ischemic heart disease and other diseases of the circulatory system: Secondary | ICD-10-CM | POA: Diagnosis not present

## 2016-12-23 DIAGNOSIS — I10 Essential (primary) hypertension: Secondary | ICD-10-CM | POA: Insufficient documentation

## 2016-12-23 DIAGNOSIS — K760 Fatty (change of) liver, not elsewhere classified: Secondary | ICD-10-CM | POA: Insufficient documentation

## 2016-12-23 DIAGNOSIS — E669 Obesity, unspecified: Secondary | ICD-10-CM | POA: Diagnosis not present

## 2016-12-23 DIAGNOSIS — K648 Other hemorrhoids: Secondary | ICD-10-CM | POA: Insufficient documentation

## 2016-12-23 DIAGNOSIS — K644 Residual hemorrhoidal skin tags: Secondary | ICD-10-CM | POA: Insufficient documentation

## 2016-12-23 DIAGNOSIS — Z87891 Personal history of nicotine dependence: Secondary | ICD-10-CM | POA: Diagnosis not present

## 2016-12-23 DIAGNOSIS — F419 Anxiety disorder, unspecified: Secondary | ICD-10-CM | POA: Insufficient documentation

## 2016-12-23 HISTORY — PX: HEMORRHOID SURGERY: SHX153

## 2016-12-23 LAB — BASIC METABOLIC PANEL
ANION GAP: 8 (ref 5–15)
BUN: 16 mg/dL (ref 6–20)
CALCIUM: 9 mg/dL (ref 8.9–10.3)
CHLORIDE: 105 mmol/L (ref 101–111)
CO2: 25 mmol/L (ref 22–32)
CREATININE: 0.71 mg/dL (ref 0.44–1.00)
GFR calc Af Amer: >60 mL/min (ref >60)
Glucose, Bld: 118 mg/dL — ABNORMAL HIGH (ref 65–99)
Potassium: 3.8 mmol/L (ref 3.5–5.1)
Sodium: 138 mmol/L (ref 135–145)

## 2016-12-23 LAB — GLUCOSE, CAPILLARY: GLUCOSE-CAPILLARY: 117 mg/dL — AB (ref 65–99)

## 2016-12-23 SURGERY — HEMORRHOIDECTOMY
Anesthesia: General | Wound class: Clean Contaminated

## 2016-12-23 MED ORDER — BUPIVACAINE HCL (PF) 0.5 % IJ SOLN
INTRAMUSCULAR | Status: AC
  Filled 2016-12-23: qty 30

## 2016-12-23 MED ORDER — SODIUM CHLORIDE 0.9 % IV SOLN
INTRAVENOUS | Status: DC | PRN
  Administered 2016-12-23: 50 mL

## 2016-12-23 MED ORDER — FAMOTIDINE 20 MG PO TABS
20.0000 mg | ORAL_TABLET | Freq: Once | ORAL | Status: AC
  Administered 2016-12-23: 20 mg via ORAL

## 2016-12-23 MED ORDER — FLEET ENEMA 7-19 GM/118ML RE ENEM: 1.0000 | ENEMA | Freq: Once | RECTAL | Status: DC

## 2016-12-23 MED ORDER — PROPOFOL 10 MG/ML IV BOLUS
INTRAVENOUS | Status: AC
  Filled 2016-12-23: qty 20

## 2016-12-23 MED ORDER — DEXAMETHASONE SODIUM PHOSPHATE 10 MG/ML IJ SOLN
INTRAMUSCULAR | Status: AC
  Filled 2016-12-23: qty 1

## 2016-12-23 MED ORDER — FENTANYL CITRATE (PF) 100 MCG/2ML IJ SOLN
INTRAMUSCULAR | Status: AC
  Administered 2016-12-23: 25 ug via INTRAVENOUS
  Filled 2016-12-23: qty 2

## 2016-12-23 MED ORDER — ONDANSETRON HCL 4 MG/2ML IJ SOLN: 4.0000 mg | Freq: Once | INTRAMUSCULAR | Status: DC | PRN

## 2016-12-23 MED ORDER — FENTANYL CITRATE (PF) 100 MCG/2ML IJ SOLN
INTRAMUSCULAR | Status: AC
  Filled 2016-12-23: qty 2

## 2016-12-23 MED ORDER — EPHEDRINE SULFATE 50 MG/ML IJ SOLN
INTRAMUSCULAR | Status: AC
  Filled 2016-12-23: qty 1

## 2016-12-23 MED ORDER — SODIUM CHLORIDE 0.9 % IV SOLN
INTRAVENOUS | Status: DC
  Administered 2016-12-23: 12:00:00 via INTRAVENOUS

## 2016-12-23 MED ORDER — EPHEDRINE SULFATE 50 MG/ML IJ SOLN
INTRAMUSCULAR | Status: DC | PRN
  Administered 2016-12-23: 10 mg via INTRAVENOUS

## 2016-12-23 MED ORDER — MIDAZOLAM HCL 2 MG/2ML IJ SOLN
INTRAMUSCULAR | Status: DC | PRN
  Administered 2016-12-23 (×2): 1 mg via INTRAVENOUS

## 2016-12-23 MED ORDER — LIDOCAINE HCL (PF) 2 % IJ SOLN
INTRAMUSCULAR | Status: AC
  Filled 2016-12-23: qty 4

## 2016-12-23 MED ORDER — BACITRACIN ZINC 500 UNIT/GM EX OINT
TOPICAL_OINTMENT | CUTANEOUS | Status: AC
  Filled 2016-12-23: qty 28.35

## 2016-12-23 MED ORDER — DEXAMETHASONE SODIUM PHOSPHATE 10 MG/ML IJ SOLN
INTRAMUSCULAR | Status: DC | PRN
  Administered 2016-12-23: 5 mg via INTRAVENOUS

## 2016-12-23 MED ORDER — ONDANSETRON HCL 4 MG/2ML IJ SOLN
INTRAMUSCULAR | Status: AC
  Filled 2016-12-23: qty 2

## 2016-12-23 MED ORDER — FAMOTIDINE 20 MG PO TABS
ORAL_TABLET | ORAL | Status: AC
  Filled 2016-12-23: qty 1

## 2016-12-23 MED ORDER — LIDOCAINE HCL (PF) 2 % IJ SOLN
INTRAMUSCULAR | Status: AC
  Filled 2016-12-23: qty 2

## 2016-12-23 MED ORDER — ONDANSETRON HCL 4 MG/2ML IJ SOLN
INTRAMUSCULAR | Status: DC | PRN
  Administered 2016-12-23: 4 mg via INTRAVENOUS

## 2016-12-23 MED ORDER — MIDAZOLAM HCL 2 MG/2ML IJ SOLN
INTRAMUSCULAR | Status: AC
  Filled 2016-12-23: qty 2

## 2016-12-23 MED ORDER — PROPOFOL 10 MG/ML IV BOLUS
INTRAVENOUS | Status: DC | PRN
  Administered 2016-12-23: 110 mg via INTRAVENOUS

## 2016-12-23 MED ORDER — OXYCODONE-ACETAMINOPHEN 5-325 MG PO TABS: 1.0000 | ORAL_TABLET | ORAL | 0 refills | Status: DC | PRN

## 2016-12-23 MED ORDER — BACITRACIN ZINC 500 UNIT/GM EX OINT
TOPICAL_OINTMENT | CUTANEOUS | Status: DC | PRN
  Administered 2016-12-23: 1 via TOPICAL

## 2016-12-23 MED ORDER — FENTANYL CITRATE (PF) 100 MCG/2ML IJ SOLN
INTRAMUSCULAR | Status: DC | PRN
  Administered 2016-12-23: 50 ug via INTRAVENOUS

## 2016-12-23 MED ORDER — BUPIVACAINE LIPOSOME 1.3 % IJ SUSP
INTRAMUSCULAR | Status: AC
  Filled 2016-12-23: qty 20

## 2016-12-23 MED ORDER — SODIUM CHLORIDE 0.9 % IJ SOLN
INTRAMUSCULAR | Status: AC
  Filled 2016-12-23: qty 50

## 2016-12-23 MED ORDER — FENTANYL CITRATE (PF) 100 MCG/2ML IJ SOLN
25.0000 ug | INTRAMUSCULAR | Status: DC | PRN
  Administered 2016-12-23 (×4): 25 ug via INTRAVENOUS

## 2016-12-23 SURGICAL SUPPLY — 25 items
BLADE SURG 15 STRL SS SAFETY (BLADE) ×3 IMPLANT
BRIEF STRETCH MATERNITY 2XLG (MISCELLANEOUS) ×3 IMPLANT
CANISTER SUCT 1200ML W/VALVE (MISCELLANEOUS) ×3 IMPLANT
DRAPE LAPAROTOMY 77X122 PED (DRAPES) ×3 IMPLANT
DRAPE LEGGINS SURG 28X43 STRL (DRAPES) ×3 IMPLANT
DRAPE UNDER BUTTOCK W/FLU (DRAPES) ×3 IMPLANT
ELECT REM PT RETURN 9FT ADLT (ELECTROSURGICAL) ×3
ELECTRODE REM PT RTRN 9FT ADLT (ELECTROSURGICAL) ×1 IMPLANT
GLOVE BIO SURGEON STRL SZ7 (GLOVE) ×9 IMPLANT
GOWN STRL REUS W/ TWL LRG LVL3 (GOWN DISPOSABLE) ×2 IMPLANT
GOWN STRL REUS W/TWL LRG LVL3 (GOWN DISPOSABLE) ×4
KIT RM TURNOVER STRD PROC AR (KITS) ×3 IMPLANT
LABEL OR SOLS (LABEL) ×3 IMPLANT
LIGASURE IMPACT 36 18CM CVD LR (INSTRUMENTS) ×3 IMPLANT
NEEDLE HYPO 25X1 1.5 SAFETY (NEEDLE) ×3 IMPLANT
NS IRRIG 500ML POUR BTL (IV SOLUTION) ×3 IMPLANT
PACK BASIN MINOR ARMC (MISCELLANEOUS) ×3 IMPLANT
PAD OB MATERNITY 4.3X12.25 (PERSONAL CARE ITEMS) ×3 IMPLANT
PAD PREP 24X41 OB/GYN DISP (PERSONAL CARE ITEMS) ×3 IMPLANT
SOL PREP PVP 2OZ (MISCELLANEOUS) ×3
SOLUTION PREP PVP 2OZ (MISCELLANEOUS) ×1 IMPLANT
SURGILUBE 2OZ TUBE FLIPTOP (MISCELLANEOUS) ×3 IMPLANT
SUT MNCRL 3-0 UNDYED SH (SUTURE) ×1 IMPLANT
SUT MONOCRYL 3-0 UNDYED (SUTURE) ×2
SYR CONTROL 10ML (SYRINGE) ×3 IMPLANT

## 2016-12-23 NOTE — OR Nursing (Signed)
CP in for EKG @ 11:59 am

## 2016-12-23 NOTE — Anesthesia Post-op Follow-up Note (Cosign Needed)
Anesthesia QCDR form completed.        

## 2016-12-23 NOTE — Anesthesia Preprocedure Evaluation (Signed)
Anesthesia Evaluation  Patient identified by MRN, date of birth, ID band Patient awake    Reviewed: Allergy & Precautions, NPO status , Patient's Chart, lab work & pertinent test results  History of Anesthesia Complications Negative for: history of anesthetic complications  Airway Mallampati: II       Dental   Pulmonary former smoker,           Cardiovascular hypertension (remote hx, no meds),      Neuro/Psych Anxiety    GI/Hepatic negative GI ROS, (+) Hepatitis -, B  Endo/Other  diabetes, Type 2  Renal/GU negative Renal ROS     Musculoskeletal   Abdominal   Peds  Hematology   Anesthesia Other Findings   Reproductive/Obstetrics                             Anesthesia Physical Anesthesia Plan  ASA: II  Anesthesia Plan: General   Post-op Pain Management:    Induction: Intravenous  Airway Management Planned: LMA  Additional Equipment:   Intra-op Plan:   Post-operative Plan:   Informed Consent: I have reviewed the patients History and Physical, chart, labs and discussed the procedure including the risks, benefits and alternatives for the proposed anesthesia with the patient or authorized representative who has indicated his/her understanding and acceptance.     Plan Discussed with:   Anesthesia Plan Comments:         Anesthesia Quick Evaluation

## 2016-12-23 NOTE — Discharge Instructions (Signed)

## 2016-12-23 NOTE — Transfer of Care (Signed)
Immediate Anesthesia Transfer of Care Note  Patient: Kathy Bryant  Procedure(s) Performed: Procedure(s): HEMORRHOIDECTOMY (N/A)  Patient Location: PACU  Anesthesia Type:General  Level of Consciousness: awake, alert  and oriented  Airway & Oxygen Therapy: Patient Spontanous Breathing  Post-op Assessment: Report given to RN and Post -op Vital signs reviewed and stable  Post vital signs: Reviewed and stable   Past Surgical History:  Procedure Laterality Date  . APPENDECTOMY  1968  . BREAST BIOPSY Right 2014   benign  . [place]  . CHOLECYSTECTOMY    . COLONOSCOPY  03/05/2014  . [place]  . HERNIA REPAIR     Abdominal  . TOTAL ABDOMINAL HYSTERECTOMY W/ BILATERAL SALPINGOOPHORECTOMY  1998   Past Medical History:  Diagnosis Date  . Anxiety   . Diabetes mellitus without complication ([place])    PT STATES SHE WAS ON METFORMIN AND STOPPED TAKING THIS ON HER OWN  . Fatty liver   . 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 Herniated disc, cervical   . HTN (hypertension)    PT WAS ON BP MEDS AND THEN STATES HER BP WAS UNDER CONTROL AND SHE STOPPED TAKING BP ON HER OWN  . Microscopic hematuria   . Obesity    No current facility-administered medications on file prior to encounter.    Current Outpatient Prescriptions on File Prior to Encounter  Medication Sig Dispense Refill  . ALPRAZolam (XANAX) 0.25 MG tablet Take 1 tablet (0.25 mg total) by mouth 2 (two) times daily as needed for anxiety. 60 tablet 0  . hydrocortisone (ANUSOL-HC) 2.5 % rectal cream Place 1 application rectally 2 (two) times daily. (Patient taking differently: Place 1 application rectally 2 (two) times daily as needed for hemorrhoids or itching. ) 30 g 1     Last Vitals:  Vitals:   12/23/16 1158 12/23/16 1336  BP: 132/75   Pulse: 78   Resp: 16   Temp: 36.8 C (!) 36 C    Last Pain:   Vitals:   12/23/16 1336  TempSrc: Temporal         Complications: No apparent  anesthesia complications

## 2016-12-23 NOTE — OR Nursing (Signed)
Lab tech in for metb draw @ 1207 pm

## 2016-12-23 NOTE — Anesthesia Procedure Notes (Signed)
Procedure Name: LMA Insertion Date/Time: 12/23/2016 12:55 PM Performed by: Kennon Holter Pre-anesthesia Checklist: Patient identified, Patient being monitored, Timeout performed, Emergency Drugs available and Suction available Patient Re-evaluated:Patient Re-evaluated prior to inductionOxygen Delivery Method: Circle system utilized Preoxygenation: Pre-oxygenation with 100% oxygen Intubation Type: IV induction Ventilation: Mask ventilation without difficulty LMA: LMA inserted LMA Size: 4.0 Tube type: Oral Number of attempts: 1 Placement Confirmation: positive ETCO2 and breath sounds checked- equal and bilateral Tube secured with: Tape Dental Injury: Teeth and Oropharynx as per pre-operative assessment

## 2016-12-23 NOTE — CV Procedure (Signed)
Surgeon: Christene Lye   Preop diagnosis: Internal/external hemorrhoid  Post op diagnosis: Same-2 columns  Operation: Internal and external hemorrhoidectomy 2 columns  Surgeon: Mckinley Jewel  Assistant:     Anesthesia: Gen.  Complications: None  EBL: Minimal  Drains: None  Description: Patient was put to sleep and then placed in lithotomy position on the operating table. Anal area was prepped and draped sterile field and timeout performed. Symptomatically the patient had complained of from bleeding and discomfort associated with what appeared to be a prominent internal hemorrhoid and the 3:00 location associated with this with an external component. Also a second external hemorrhoid with [place] internal component was located at 11:00. In both locations and the internal hemorrhoid to moderately irritated and with small erosions. 20 mL of air expelled mixed with 3 mL of saline was then instilled all around the anal area and in the intersphincteric groove for postop analgesia. The 2 hemorrhoids in question were then sequentially removed with the use of the LigaSure device. The skin part was dealt with the. Primarily with the scalpel and cautery. Skin opening at both sides were closed with the figure-of-eight stitch of 3-0 Monocryl. There were on a small hemorrhoids visualize on anal speculum examination but these were too small and uninvolved at this time and it was therefore left alone. No other lesions were identified within the anal canal in the lowermost rectal area. The area was covered with bacitracin ointment and dressed. Patient subsequently returned recovery room stable condition

## 2016-12-23 NOTE — Anesthesia Postprocedure Evaluation (Signed)
Anesthesia Post Note  Patient: Kathy Bryant  Procedure(s) Performed: Procedure(s) (LRB): HEMORRHOIDECTOMY (N/A)  Patient location during evaluation: PACU Anesthesia Type: General Level of consciousness: awake and alert Pain management: pain level controlled Vital Signs Assessment: post-procedure vital signs reviewed and stable Respiratory status: spontaneous breathing and respiratory function stable Cardiovascular status: stable Anesthetic complications: no     Last Vitals:  Vitals:   12/23/16 1425 12/23/16 1438  BP:  (!) 102/52  Pulse: 66 62  Resp: 18 16  Temp: 36.1 C 36.8 C    Last Pain:  Vitals:   12/23/16 1438  TempSrc: Oral  PainSc:                  [name] K

## 2016-12-23 NOTE — Interval H&P Note (Signed)
History and Physical Interval Note:  12/23/2016 12:27 PM  Kathy Bryant  has presented today for surgery, with the diagnosis of internal and external hemorrhoids  The various methods of treatment have been discussed with the patient and family. After consideration of risks, benefits and other options for treatment, the patient has consented to  Procedure(s): HEMORRHOIDECTOMY (N/A) as a surgical intervention .  The patient's history has been reviewed, patient examined, no change in status, stable for surgery.  I have reviewed the patient's chart and labs.  Questions were answered to the patient's satisfaction.     Adrianna Dudas G

## 2016-12-23 NOTE — H&P (View-Only) (Signed)
Patient ID: Kathy Bryant, female   DOB: Dec 31, 1945, 71 y.o.   MRN: [id number]  Chief Complaint  Patient presents with  . Other    HPI Kathy Bryant is a 71 y.o. female here today for a evaluation of hemorrhoids. Patient states last Friday she started having some pain. She noticed bleeding on the paper and some discharge from the rectal area. Last colonoscopy 03/05/2014- 2 polyps removed-tubular adenomas HPI  Past Medical History:  Diagnosis Date  . Diabetes mellitus without complication ([place])   . Fatty liver   . Herniated disc, cervical   . HTN (hypertension)   . Microscopic hematuria   . Obesity     Past Surgical History:  Procedure Laterality Date  . APPENDECTOMY  1968  . BREAST BIOPSY Right 2014   benign  . [place]  . COLONOSCOPY  03/05/2014  . [place]  . HERNIA REPAIR     Abdominal  . TOTAL ABDOMINAL HYSTERECTOMY W/ BILATERAL SALPINGOOPHORECTOMY  1998    Family History  Problem Relation Age of Onset  . Diabetes Mother   . Cancer Mother 29       Brain Tumor - died age 86  . Heart disease Father        CAD - died MI - age 78  . Hypertension Father   . Diabetes Sister   . Diabetes Brother   . Diabetes Brother   . Cancer Paternal Aunt        Renal Cell Ca  . Cancer Paternal Uncle        Renal Cell Ca    Social History Social History  Substance Use Topics  . Smoking status: Never Smoker  . Smokeless tobacco: Never Used  . Alcohol use Yes     Comment: Occasional glass of wine    No Known Allergies  Current Outpatient Prescriptions  Medication Sig Dispense Refill  . ALPRAZolam (XANAX) 0.25 MG tablet Take 1 tablet (0.25 mg total) by mouth 2 (two) times daily as needed for anxiety. 60 tablet 0  . hydrocortisone (ANUSOL-HC) 2.5 % rectal cream Place 1 application rectally 2 (two) times daily. 30 g 1   No current facility-administered medications for this visit.     Review of Systems Review of Systems    Constitutional: Negative.   Respiratory: Negative.   Cardiovascular: Negative.   Gastrointestinal: Negative.     Blood pressure 122/78, pulse 88, resp. rate 12, height 5\' 4"  (1.626 m), weight 188 lb (85.3 kg).  Physical Exam Physical Exam  Constitutional: She is oriented to person, place, and time. She appears well-developed and well-nourished.  Eyes: Conjunctivae are normal. No scleral icterus.  Neck: Neck supple.  Cardiovascular: Normal rate, regular rhythm and normal heart sounds.   Pulmonary/Chest: Effort normal and breath sounds normal.  Abdominal: Soft. Normal appearance and bowel sounds are normal. There is no hepatomegaly. There is no tenderness. No hernia.  Genitourinary: Rectal exam shows external hemorrhoid and internal hemorrhoid.     Lymphadenopathy:    She has no cervical adenopathy.  Neurological: She is alert and oriented to person, place, and time.  Skin: Skin is warm and dry.  With pt consent, anoscopy was done. In addition to the prominent internal hemorrhoid at 3 ocl, there are a couple of other small one on right side  Data Reviewed Notes reviewed   Assessment    Ext and int hemorrhoids    Plan   Discussed option of banding  the large internal hemorrhoid or consider hemorrhoidectomy of all hemorrhoids.  Procedures, risks and benefits explained. She will think about it and decide.. Follow up appointment to be announced.    HPI, Physical Exam, Assessment and Plan have been scribed under the direction and in the presence of Mckinley Jewel, MD  Gaspar Cola, CMA    I have completed the exam and reviewed the above documentation for accuracy and completeness.  I agree with the above.  Haematologist has been used and any errors in dictation or transcription are unintentional.  Yeray Tomas G. Jamal Collin, M.D., F.A.C.S.    Junie Panning G 12/20/2016, 2:02 PM

## 2016-12-24 LAB — SURGICAL PATHOLOGY

## 2016-12-28 ENCOUNTER — Ambulatory Visit: Payer: BC Managed Care – PPO | Admitting: Nurse Practitioner

## 2016-12-30 ENCOUNTER — Ambulatory Visit (INDEPENDENT_AMBULATORY_CARE_PROVIDER_SITE_OTHER): Payer: BC Managed Care – PPO | Admitting: General Surgery

## 2016-12-30 ENCOUNTER — Encounter: Payer: Self-pay | Admitting: General Surgery

## 2016-12-30 VITALS — BP 130/90 | HR 96 | Resp 12 | Ht 66.0 in | Wt 185.0 lb

## 2016-12-30 DIAGNOSIS — K648 Other hemorrhoids: Secondary | ICD-10-CM

## 2016-12-30 DIAGNOSIS — K644 Residual hemorrhoidal skin tags: Secondary | ICD-10-CM

## 2016-12-30 MED ORDER — DIBUCAINE 1 % RE OINT: 1.0000 "application " | TOPICAL_OINTMENT | RECTAL | 0 refills | Status: DC | PRN

## 2016-12-30 NOTE — Patient Instructions (Signed)
Return in one month.  

## 2016-12-30 NOTE — Progress Notes (Signed)
Patient ID: Kathy Bryant, female   DOB: 02/14/1946, 71 y.o.   MRN: [id number]  Chief Complaint  Patient presents with  . Routine Post Op    hemorrhoidectomy    HPI Kathy Bryant is a 71 y.o. female here today for her post op hemorrhoidectomy done on 12/23/2016. Patient states the area is sore and with bowel movements the area is painful. It is better last 24 hrs HPI  Past Medical History:  Diagnosis Date  . Anxiety   . Diabetes mellitus without complication ([place])    PT STATES SHE WAS ON METFORMIN AND STOPPED TAKING THIS ON HER OWN  . Fatty liver   . 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 Herniated disc, cervical   . HTN (hypertension)    PT WAS ON BP MEDS AND THEN STATES HER BP WAS UNDER CONTROL AND SHE STOPPED TAKING BP ON HER OWN  . Microscopic hematuria   . Obesity     Past Surgical History:  Procedure Laterality Date  . APPENDECTOMY  1968  . BREAST BIOPSY Right 2014   benign  . [place]  . CHOLECYSTECTOMY    . COLONOSCOPY  03/05/2014  . [place]  . HEMORRHOID SURGERY N/A 12/23/2016   Procedure: HEMORRHOIDECTOMY;  Surgeon: Christene Lye, MD;  Location: [facility] ORS;  Service: General;  Laterality: N/A;  . HERNIA REPAIR     Abdominal  . TOTAL ABDOMINAL HYSTERECTOMY W/ BILATERAL SALPINGOOPHORECTOMY  1998    Family History  Problem Relation Age of Onset  . Diabetes Mother   . Cancer Mother 64       Brain Tumor - died age 29  . Heart disease Father        CAD - died MI - age 88  . Hypertension Father   . Diabetes Sister   . Diabetes Brother   . Diabetes Brother   . Cancer Paternal Aunt        Renal Cell Ca  . Cancer Paternal Uncle        Renal Cell Ca    Social History Social History  Substance Use Topics  . Smoking status: Former Smoker    Types: Cigarettes    Quit date: 12/23/2006  . Smokeless tobacco: Never Used     Comment: "I WAS A CLOSET SMOKER AND NEVER SMOKED ALOT"  . Alcohol use Yes     Comment: Occasional  glass of wine    No Known Allergies  Current Outpatient Prescriptions  Medication Sig Dispense Refill  . ALPRAZolam (XANAX) 0.25 MG tablet Take 1 tablet (0.25 mg total) by mouth 2 (two) times daily as needed for anxiety. 60 tablet 0  . hydrocortisone (ANUSOL-HC) 2.5 % rectal cream Place 1 application rectally 2 (two) times daily. (Patient taking differently: Place 1 application rectally 2 (two) times daily as needed for hemorrhoids or itching. ) 30 g 1  . ibuprofen (ADVIL,MOTRIN) 200 MG tablet Take 600 mg by mouth every 8 (eight) hours as needed (for pain.).    Marland Kitchen neomycin-polymyxin b-dexamethasone (MAXITROL) 3.5-10000-0.1 SUSP Place 1 drop into both eyes 2 (two) times daily as needed (FOR EYE IRRITATION/INFECTION).    Marland Kitchen oxyCODONE-acetaminophen (ROXICET) 5-325 MG tablet Take 1 tablet by mouth every 4 (four) hours as needed for severe pain. 20 tablet 0  . dibucaine (NUPERCAINAL) 1 % OINT Place 1 application rectally as needed for pain. 1 Tube 0   No current facility-administered medications for this visit.  Review of Systems Review of Systems  Constitutional: Negative.   Respiratory: Negative.   Cardiovascular: Negative.   Gastrointestinal: Negative.     Blood pressure 130/90, pulse 96, resp. rate 12, height 5\' 6"  (1.676 m), weight 185 lb (83.9 kg).  Physical Exam Physical Exam  Constitutional: She is oriented to person, place, and time. She appears well-developed and well-nourished.  Neurological: She is alert and oriented to person, place, and time.  Skin: Skin is warm and dry.  Healing wounds at 3 and 11 ocl locations. No sign of infection  Data Reviewed Pathology-hemorrhoids  Assessment    Doing well-as expected 1 week post op.    Plan   Rx for nupercainol oint for comfort Patient to return in four weeks.    HPI, Physical Exam, Assessment and Plan have been scribed under the direction and in the presence of Mckinley Jewel, MD  Gaspar Cola, CMA  I have completed  the exam and reviewed the above documentation for accuracy and completeness.  I agree with the above.  Haematologist has been used and any errors in dictation or transcription are unintentional.  Unita Detamore G. Jamal Collin, M.D., F.A.C.S.  Junie Panning G 12/30/2016, 1:56 PM

## 2017-01-26 ENCOUNTER — Ambulatory Visit: Payer: BC Managed Care – PPO | Admitting: General Surgery

## 2017-02-17 ENCOUNTER — Ambulatory Visit: Payer: BC Managed Care – PPO | Admitting: General Surgery

## 2017-02-17 ENCOUNTER — Telehealth: Payer: Self-pay | Admitting: *Deleted

## 2017-02-17 NOTE — Telephone Encounter (Signed)
I talked with the patient and she said she needs to cancel appointment, too busy. She is doing well no pain and no bleeding, wishes not to reschedule at this time,Dr [facility] aware.

## 2017-02-22 ENCOUNTER — Telehealth: Payer: Self-pay | Admitting: *Deleted

## 2017-02-22 NOTE — Telephone Encounter (Signed)
Patient called and was wondering why her balance for the office visit was high. I did try an explained it to her and she refused to expect the balance for the office visit. She is aware that you are out of the office until next week.

## 2017-03-02 ENCOUNTER — Encounter: Payer: Self-pay | Admitting: General Surgery

## 2017-03-02 NOTE — Telephone Encounter (Signed)
Letter sent to patient with breakdown of charges, insurance allowables and payments, and patient payments.

## 2017-04-22 ENCOUNTER — Telehealth: Payer: Self-pay | Admitting: Family

## 2017-04-22 DIAGNOSIS — E118 Type 2 diabetes mellitus with unspecified complications: Secondary | ICD-10-CM

## 2017-04-22 NOTE — Telephone Encounter (Signed)
Patient Notified and scheduled appointment for [facility] quality Metric patient has scheduled appointment for 05/02/17 , to close metrics patient will need, A1c, Micro albumin and BP < 140/90

## 2017-04-25 NOTE — Telephone Encounter (Signed)
Noted! Thank you

## 2017-04-25 NOTE — Addendum Note (Signed)
Addended by: Burnard Hawthorne on: 04/25/2017 10:06 AM   Modules accepted: Orders

## 2017-05-02 ENCOUNTER — Encounter: Payer: Self-pay | Admitting: Family

## 2017-05-02 ENCOUNTER — Ambulatory Visit (INDEPENDENT_AMBULATORY_CARE_PROVIDER_SITE_OTHER): Payer: BC Managed Care – PPO | Admitting: Family

## 2017-05-02 VITALS — BP 126/72 | HR 67 | Temp 97.9°F | Ht 66.0 in | Wt 191.4 lb

## 2017-05-02 DIAGNOSIS — K760 Fatty (change of) liver, not elsewhere classified: Secondary | ICD-10-CM

## 2017-05-02 DIAGNOSIS — R05 Cough: Secondary | ICD-10-CM

## 2017-05-02 DIAGNOSIS — Z1231 Encounter for screening mammogram for malignant neoplasm of breast: Secondary | ICD-10-CM | POA: Diagnosis not present

## 2017-05-02 DIAGNOSIS — E118 Type 2 diabetes mellitus with unspecified complications: Secondary | ICD-10-CM | POA: Diagnosis not present

## 2017-05-02 DIAGNOSIS — Z1239 Encounter for other screening for malignant neoplasm of breast: Secondary | ICD-10-CM

## 2017-05-02 DIAGNOSIS — R059 Cough, unspecified: Secondary | ICD-10-CM

## 2017-05-02 DIAGNOSIS — E559 Vitamin D deficiency, unspecified: Secondary | ICD-10-CM | POA: Diagnosis not present

## 2017-05-02 LAB — TSH: TSH: 2.59 u[IU]/mL (ref 0.35–4.50)

## 2017-05-02 LAB — MICROALBUMIN / CREATININE URINE RATIO
Creatinine,U: 172.9 mg/dL
MICROALB/CREAT RATIO: 0.9 mg/g (ref 0.0–30.0)
Microalb, Ur: 1.6 mg/dL (ref 0.0–1.9)

## 2017-05-02 LAB — COMPREHENSIVE METABOLIC PANEL
ALT: 23 U/L (ref 0–35)
AST: 17 U/L (ref 0–37)
Albumin: 4 g/dL (ref 3.5–5.2)
Alkaline Phosphatase: 61 U/L (ref 39–117)
BUN: 15 mg/dL (ref 6–23)
CALCIUM: 9.3 mg/dL (ref 8.4–10.5)
CHLORIDE: 106 meq/L (ref 96–112)
CO2: 28 meq/L (ref 19–32)
Creatinine, Ser: 0.85 mg/dL (ref 0.40–1.20)
GFR: 70.32 mL/min (ref >60.00)
GLUCOSE: 130 mg/dL — AB (ref 70–99)
POTASSIUM: 4.1 meq/L (ref 3.5–5.1)
Sodium: 143 mEq/L (ref 135–145)
Total Bilirubin: 0.4 mg/dL (ref 0.2–1.2)
Total Protein: 6.7 g/dL (ref 6.0–8.3)

## 2017-05-02 LAB — CBC WITH DIFFERENTIAL/PLATELET
Basophils Absolute: 0.1 10*3/uL (ref 0.0–0.1)
Basophils Relative: 0.8 % (ref 0.0–3.0)
EOS PCT: 1.7 % (ref 0.0–5.0)
Eosinophils Absolute: 0.1 10*3/uL (ref 0.0–0.7)
HCT: 43.2 % (ref 36.0–46.0)
HEMOGLOBIN: 14.4 g/dL (ref 12.0–15.0)
Lymphocytes Relative: 31.2 % (ref 12.0–46.0)
Lymphs Abs: 2.4 10*3/uL (ref 0.7–4.0)
MCHC: 33.4 g/dL (ref 30.0–36.0)
MCV: 95.7 fl (ref 78.0–100.0)
MONO ABS: 0.5 10*3/uL (ref 0.1–1.0)
Monocytes Relative: 6 % (ref 3.0–12.0)
NEUTROS PCT: 60.3 % (ref 43.0–77.0)
Neutro Abs: 4.6 10*3/uL (ref 1.4–7.7)
PLATELETS: 209 10*3/uL (ref 150.0–400.0)
RBC: 4.51 Mil/uL (ref 3.87–5.11)
RDW: 14.3 % (ref 11.5–15.5)
WBC: 7.7 10*3/uL (ref 4.0–10.5)

## 2017-05-02 LAB — LIPID PANEL
CHOL/HDL RATIO: 4
CHOLESTEROL: 220 mg/dL — AB (ref 0–200)
HDL: 51.1 mg/dL (ref >39.00)
LDL CALC: 132 mg/dL — AB (ref 0–99)
NonHDL: 169.06
TRIGLYCERIDES: 187 mg/dL — AB (ref 0.0–149.0)
VLDL: 37.4 mg/dL (ref 0.0–40.0)

## 2017-05-02 LAB — HEMOGLOBIN A1C: Hgb A1c MFr Bld: 6.3 % (ref 4.6–6.5)

## 2017-05-02 LAB — VITAMIN D 25 HYDROXY (VIT D DEFICIENCY, FRACTURES): VITD: 16.8 ng/mL — ABNORMAL LOW (ref 30.00–100.00)

## 2017-05-02 NOTE — Progress Notes (Signed)
Pre visit review using our clinic review tool, if applicable. No additional management support is needed unless otherwise documented below in the visit note. 

## 2017-05-02 NOTE — Progress Notes (Signed)
Subjective:    Patient ID: Kathy Bryant, female    DOB: March 26, 1946, 71 y.o.   MRN: [id number]  CC: Kathy Bryant is a 71 y.o. female who presents today for follow up.   HPI:  Dry Cough x one week, improved.  Nasal congestion.  No wheezing, fever, ear pain, sob. Continues to be tired.  Has been taking sudafed which helped with nasal congestion.   States doesn't have HTN, DM- no medications  H/o Hep B and fatty liver- declines GI referral today. Notes she does snore.    Colonoscopy 2015; polyp tubular adenoma; repeat in 5 years Due for mammogram.  HISTORY:  Past Medical History:  Diagnosis Date  . Anxiety   . Diabetes mellitus without complication ([place])    PT STATES SHE WAS ON METFORMIN AND STOPPED TAKING THIS ON HER OWN  . Fatty liver   . 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 Herniated disc, cervical   . HTN (hypertension)    PT WAS ON BP MEDS AND THEN STATES HER BP WAS UNDER CONTROL AND SHE STOPPED TAKING BP ON HER OWN  . Microscopic hematuria   . Obesity    Past Surgical History:  Procedure Laterality Date  . APPENDECTOMY  1968  . BREAST BIOPSY Right 2014   benign  . [place]  . CHOLECYSTECTOMY    . COLONOSCOPY  03/05/2014  . [place]  . HEMORRHOID SURGERY N/A 12/23/2016   Procedure: HEMORRHOIDECTOMY;  Surgeon: Christene Lye, MD;  Location: [facility] ORS;  Service: General;  Laterality: N/A;  . HERNIA REPAIR     Abdominal  . TOTAL ABDOMINAL HYSTERECTOMY W/ BILATERAL SALPINGOOPHORECTOMY  1998   Family History  Problem Relation Age of Onset  . Diabetes Mother   . Cancer Mother 61       Brain Tumor - died age 58  . Heart disease Father        CAD - died MI - age 74  . Hypertension Father   . Diabetes Sister   . Diabetes Brother   . Diabetes Brother   . Cancer Paternal Aunt        Renal Cell Ca  . Cancer Paternal Uncle        Renal Cell Ca    Allergies: Patient has no known allergies. Current Outpatient  Prescriptions on File Prior to Visit  Medication Sig Dispense Refill  . ALPRAZolam (XANAX) 0.25 MG tablet Take 1 tablet (0.25 mg total) by mouth 2 (two) times daily as needed for anxiety. 60 tablet 0  . dibucaine (NUPERCAINAL) 1 % OINT Place 1 application rectally as needed for pain. 1 Tube 0  . hydrocortisone (ANUSOL-HC) 2.5 % rectal cream Place 1 application rectally 2 (two) times daily. (Patient taking differently: Place 1 application rectally 2 (two) times daily as needed for hemorrhoids or itching. ) 30 g 1  . ibuprofen (ADVIL,MOTRIN) 200 MG tablet Take 600 mg by mouth every 8 (eight) hours as needed (for pain.).    Marland Kitchen neomycin-polymyxin b-dexamethasone (MAXITROL) 3.5-10000-0.1 SUSP Place 1 drop into both eyes 2 (two) times daily as needed (FOR EYE IRRITATION/INFECTION).    Marland Kitchen oxyCODONE-acetaminophen (ROXICET) 5-325 MG tablet Take 1 tablet by mouth every 4 (four) hours as needed for severe pain. 20 tablet 0   No current facility-administered medications on file prior to visit.     Social History  Substance Use Topics  . Smoking status: Former Smoker    Types:  Cigarettes    Quit date: 12/23/2006  . Smokeless tobacco: Never Used     Comment: "I WAS A CLOSET SMOKER AND NEVER SMOKED ALOT"  . Alcohol use Yes     Comment: Occasional glass of wine    Review of Systems  Constitutional: Negative for chills and fever.  HENT: Positive for congestion. Negative for ear pain, sinus pressure and sore throat.   Respiratory: Positive for cough. Negative for shortness of breath and wheezing.   Cardiovascular: Negative for chest pain and palpitations.  Gastrointestinal: Negative for nausea and vomiting.      Objective:    BP 126/72   Pulse 67   Temp 97.9 F (36.6 C) (Oral)   Ht 5\' 6"  (1.676 m)   Wt 191 lb 6.4 oz (86.8 kg)   SpO2 96%   BMI 30.89 kg/m  BP Readings from Last 3 Encounters:  05/02/17 126/72  12/30/16 130/90  12/23/16 124/64   Wt Readings from Last 3 Encounters:  05/02/17  191 lb 6.4 oz (86.8 kg)  12/30/16 185 lb (83.9 kg)  12/23/16 185 lb (83.9 kg)    Physical Exam  Constitutional: She appears well-developed and well-nourished.  HENT:  Head: Normocephalic and atraumatic.  Right Ear: Hearing, tympanic membrane, external ear and ear canal normal. No drainage, swelling or tenderness. No foreign bodies. Tympanic membrane is not erythematous and not bulging. No middle ear effusion. No decreased hearing is noted.  Left Ear: Hearing, tympanic membrane, external ear and ear canal normal. No drainage, swelling or tenderness. No foreign bodies. Tympanic membrane is not erythematous and not bulging.  No middle ear effusion. No decreased hearing is noted.  Nose: Nose normal. No rhinorrhea. Right sinus exhibits no maxillary sinus tenderness and no frontal sinus tenderness. Left sinus exhibits no maxillary sinus tenderness and no frontal sinus tenderness.  Mouth/Throat: Uvula is midline, oropharynx is clear and moist and mucous membranes are normal. No oropharyngeal exudate, posterior oropharyngeal edema, posterior oropharyngeal erythema or tonsillar abscesses.  Eyes: Conjunctivae are normal.  Cardiovascular: Regular rhythm, normal heart sounds and normal pulses.   Pulmonary/Chest: Effort normal and breath sounds normal. She has no wheezes. She has no rhonchi. She has no rales.  Lymphadenopathy:       Head (right side): No submental, no submandibular, no tonsillar, no preauricular, no posterior auricular and no occipital adenopathy present.       Head (left side): No submental, no submandibular, no tonsillar, no preauricular, no posterior auricular and no occipital adenopathy present.    She has no cervical adenopathy.  Neurological: She is alert.  Skin: Skin is warm and dry.  Psychiatric: She has a normal mood and affect. Her speech is normal and behavior is normal. Thought content normal.  Vitals reviewed.      Assessment & Plan:   Problem List Items Addressed This  Visit      Digestive   Fatty liver    Discussed fatty liver and treatment including weight loss. Patient declines sleep study for sleep apnea today as well as a referral to GI for any further consult at this time. She feels most comfortable watching liver studies in her lab work which we are doing today. Will follow.      Relevant Orders   Comprehensive metabolic panel     Endocrine   DM (diabetes mellitus) ([place]) - Primary    Prediabetic. Pending studies.  Note- will return for CPE.       Relevant Orders   CBC with Differential/Platelet  Comprehensive metabolic panel   Hemoglobin A1c   Hepatitis C antibody   Lipid panel   TSH   Microalbumin / creatinine urine ratio     Other   Vitamin D deficiency   Relevant Orders   VITAMIN D 25 Hydroxy (Vit-D Deficiency, Fractures)   Cough    Improving. Afebrile. Patient and I jointly decided to watch and stay vigilant withsymptoms. If patient does not continue to improve, she will let me know.      Screening for breast cancer    Ordered 3d. Patient understands to schedule.       Relevant Orders   MM SCREENING BREAST TOMO BILATERAL       I am having Ms. Leavey maintain her ALPRAZolam, hydrocortisone, neomycin-polymyxin b-dexamethasone, ibuprofen, oxyCODONE-acetaminophen, and dibucaine.   No orders of the defined types were placed in this encounter.   Return precautions given.   Risks, benefits, and alternatives of the medications and treatment plan prescribed today were discussed, and patient expressed understanding.   Education regarding symptom management and diagnosis given to patient on AVS.  Continue to follow with Burnard Hawthorne, FNP for routine health maintenance.   Kathy Bryant and I agreed with plan.   [name], FNP

## 2017-05-02 NOTE — Addendum Note (Signed)
Addended by: Arby Barrette on: 05/02/2017 09:57 AM   Modules accepted: Orders

## 2017-05-02 NOTE — Patient Instructions (Addendum)
Labs today  Please return for physical  We will continue to watch liver enzymes due to h/o fatty  Liver disease. If you would like referral to GI for consult, please let me know.   We placed a referral for mammogram this year. I asked that you call one the below locations and schedule this when it is convenient for you.   As discussed, I would like you to ask for 3D mammogram over the traditional 2D mammogram as new evidence suggest 3D is superior.   Please note that NOT all insurance companies cover 3D and you may have to pay a higher copay. You may call your insurance company to further clarify your benefits.   Options for [place]  [place], [place]  * [facility] [place], [place] * Note if you ask for 3D mammogram at this location, you must request [place], [place] location*

## 2017-05-02 NOTE — Assessment & Plan Note (Signed)
Ordered 3d. Patient understands to schedule.

## 2017-05-02 NOTE — Assessment & Plan Note (Signed)
Prediabetic. Pending studies.  Note- will return for CPE.

## 2017-05-02 NOTE — Assessment & Plan Note (Signed)
Improving. Afebrile. Patient and I jointly decided to watch and stay vigilant withsymptoms. If patient does not continue to improve, she will let me know.

## 2017-05-02 NOTE — Assessment & Plan Note (Signed)
Discussed fatty liver and treatment including weight loss. Patient declines sleep study for sleep apnea today as well as a referral to GI for any further consult at this time. She feels most comfortable watching liver studies in her lab work which we are doing today. Will follow.

## 2017-05-04 NOTE — Telephone Encounter (Signed)
Error

## 2017-05-27 ENCOUNTER — Encounter: Payer: Self-pay | Admitting: Family

## 2017-06-15 ENCOUNTER — Ambulatory Visit: Payer: BC Managed Care – PPO

## 2017-10-27 ENCOUNTER — Other Ambulatory Visit: Payer: Self-pay | Admitting: Family

## 2017-10-27 DIAGNOSIS — Z1231 Encounter for screening mammogram for malignant neoplasm of breast: Secondary | ICD-10-CM

## 2017-11-10 ENCOUNTER — Ambulatory Visit
Admission: RE | Admit: 2017-11-10 | Discharge: 2017-11-10 | Disposition: A | Discharge status: Home / Self Care | Payer: BC Managed Care – PPO | Source: Ambulatory Visit | Attending: Family | Admitting: Family

## 2017-11-10 DIAGNOSIS — Z1231 Encounter for screening mammogram for malignant neoplasm of breast: Secondary | ICD-10-CM | POA: Diagnosis not present

## 2017-11-10 IMAGING — MG DIGITAL SCREENING BILATERAL MAMMOGRAM WITH TOMO AND CAD
8 of 14 series · 8 of 30 positions shown · non-contrast
Comparison: Previous exam(s).

CLINICAL DATA: Screening.

EXAM:
DIGITAL SCREENING BILATERAL MAMMOGRAM WITH TOMO AND CAD

[L CC]
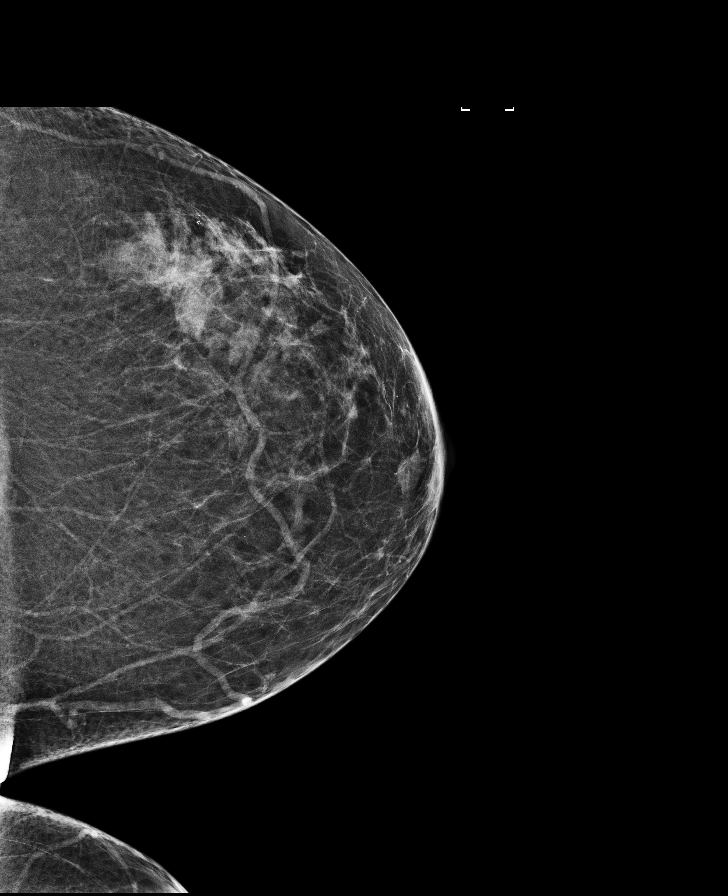

[L CC synth-2D]
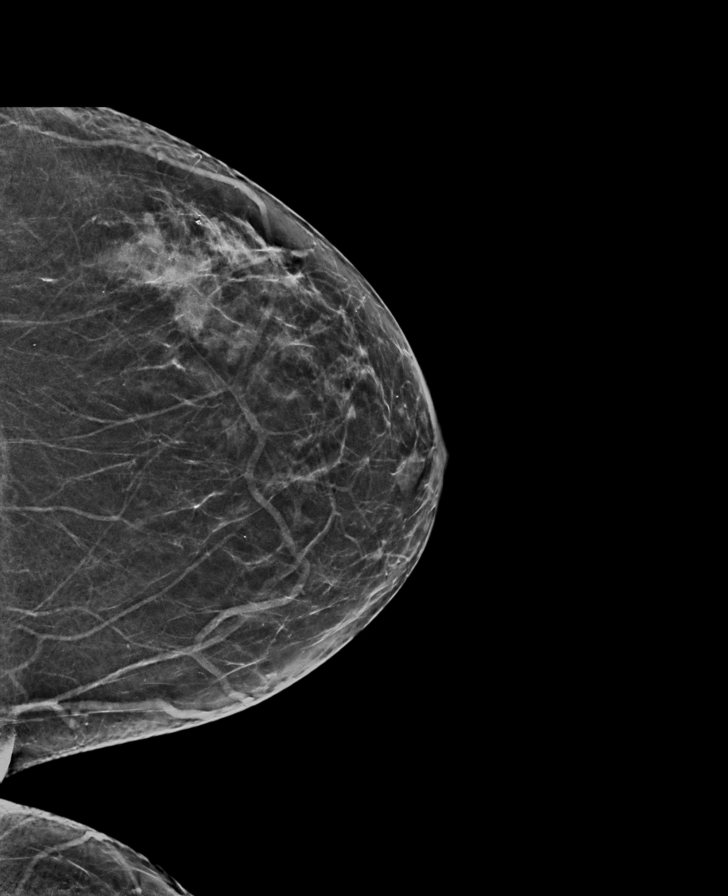

[R CC synth-2D]
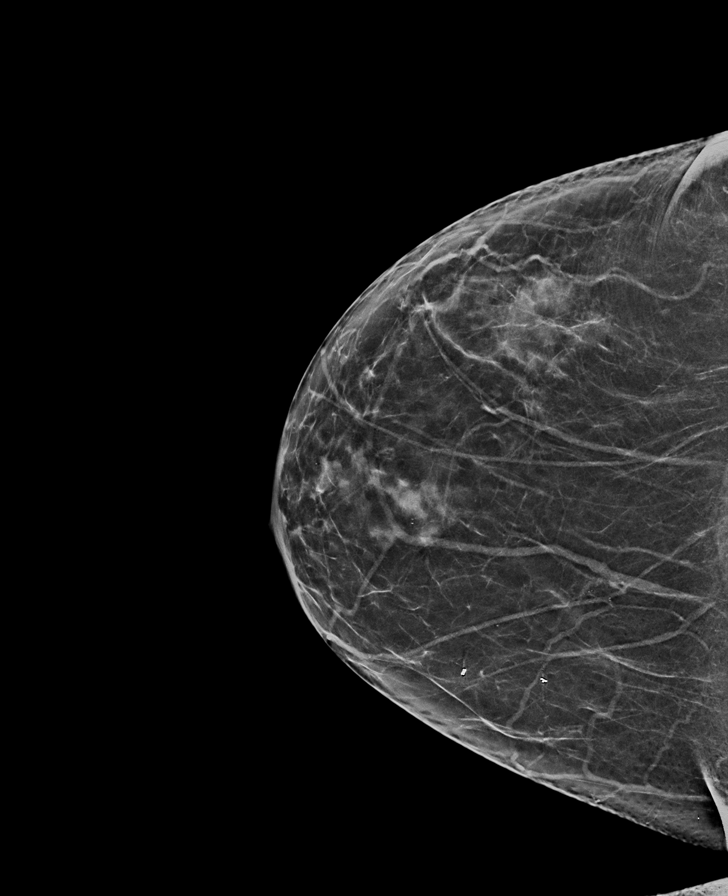

[R MLO synth-2D]
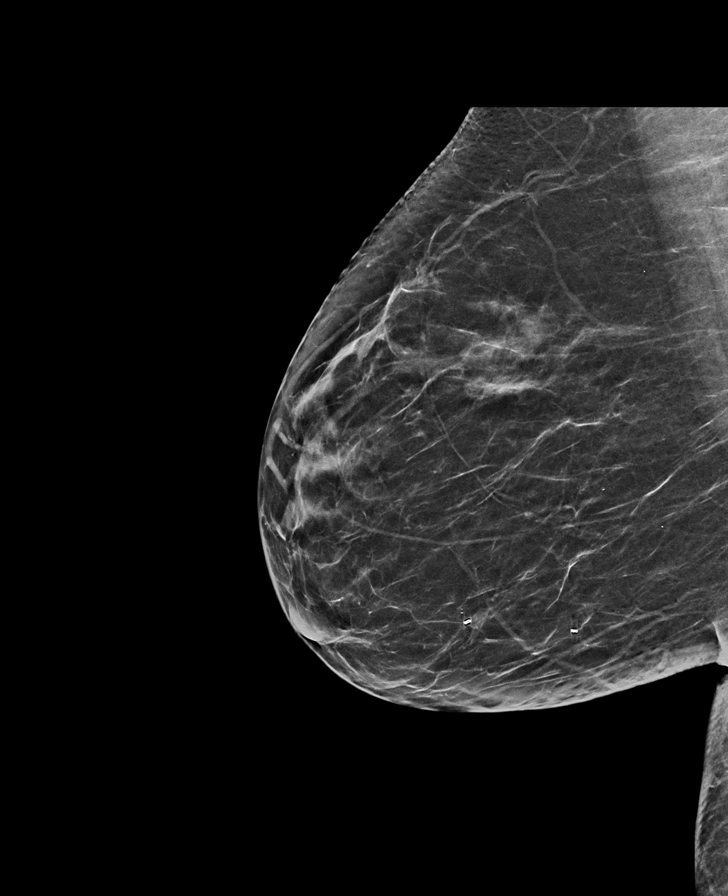

[L MLO]
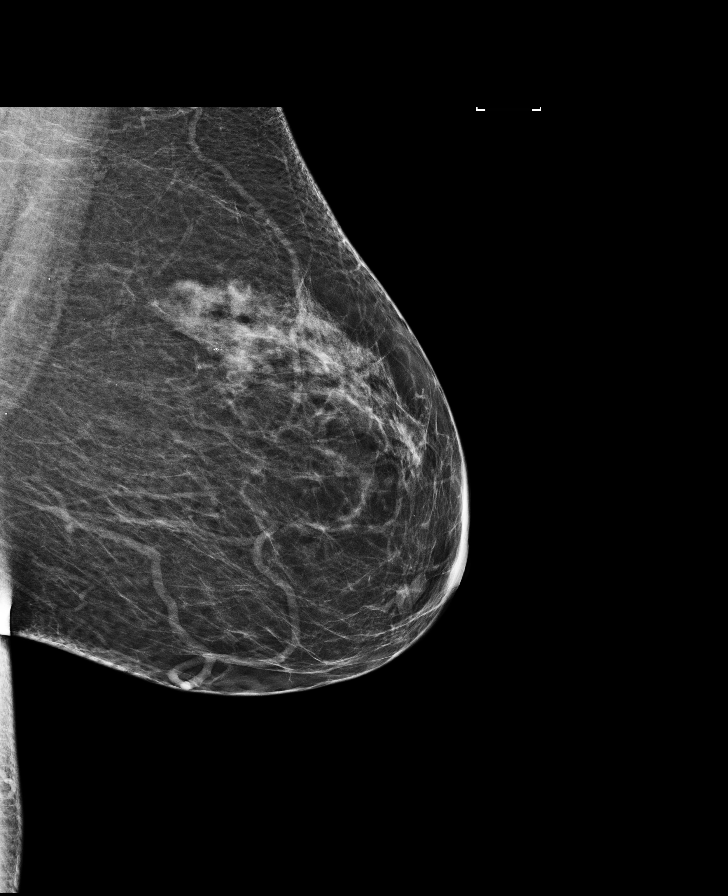

[L MLO synth-2D]
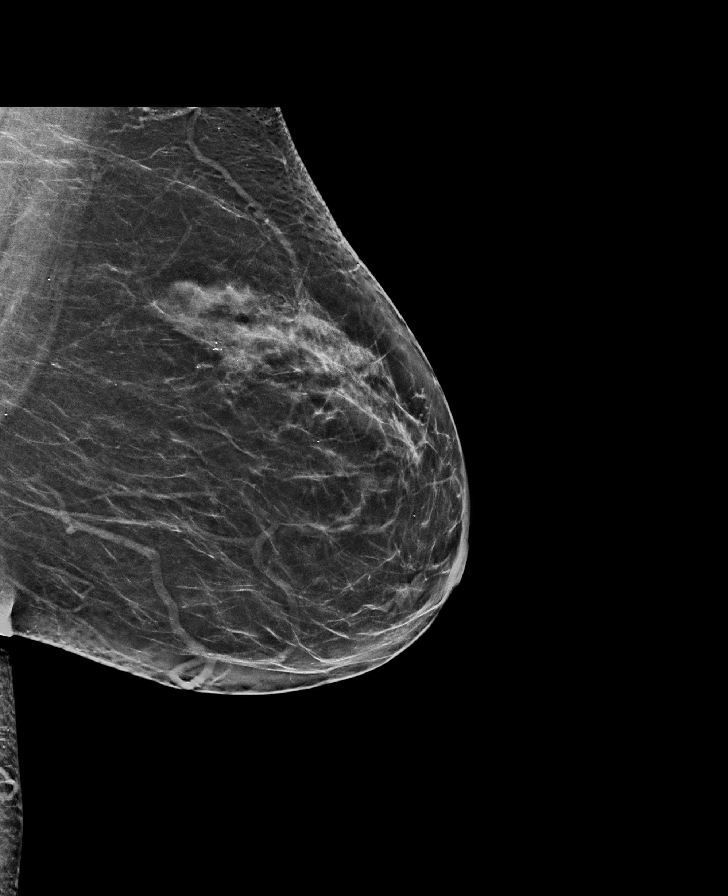

[R MLO]
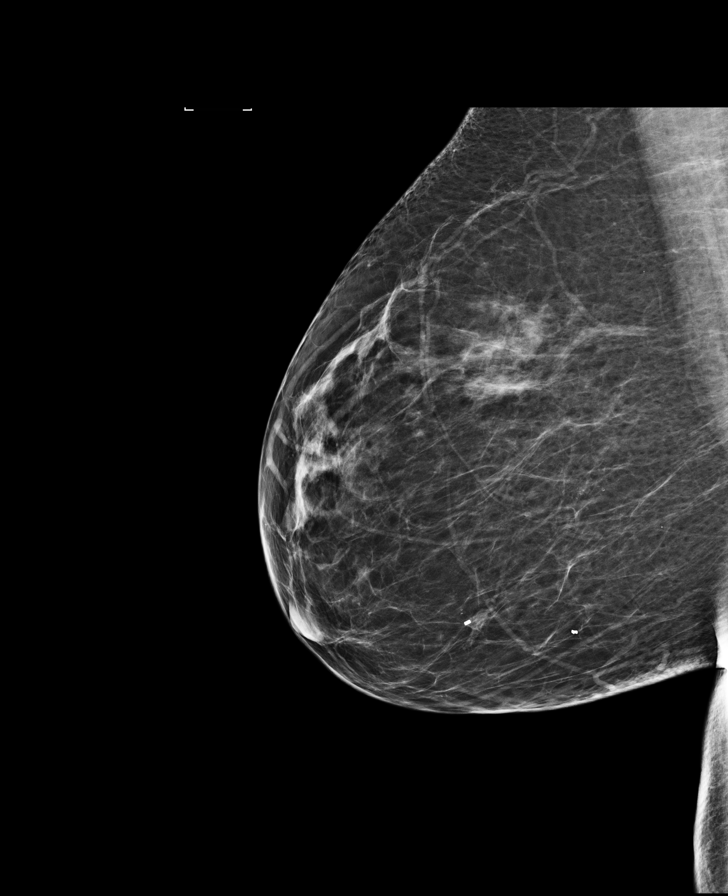

[R CC]
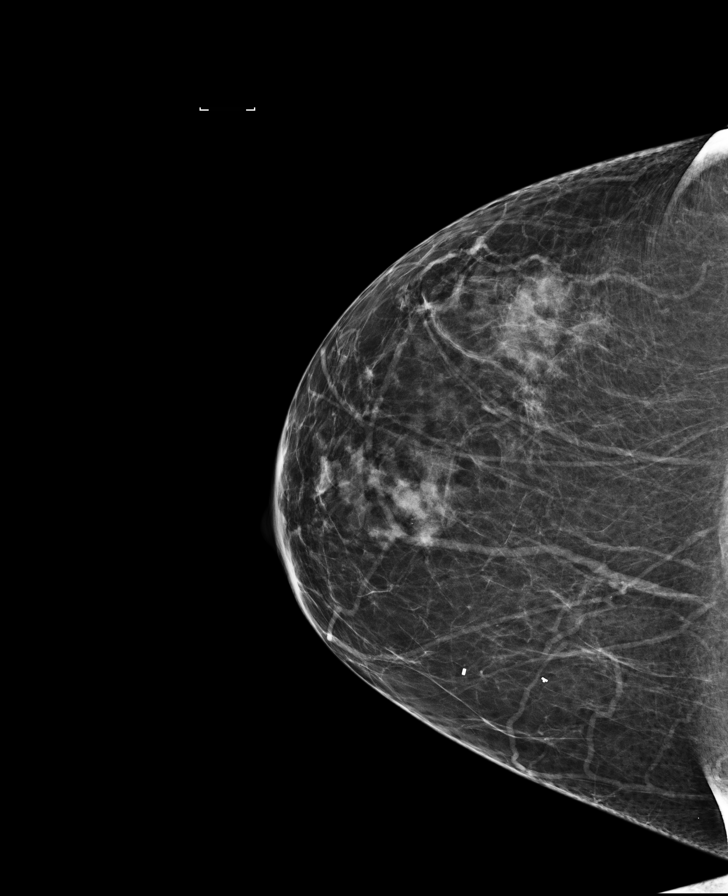

[8 of 30 positions shown; findings below may reference images not displayed]

ACR Breast Density Category b: There are scattered areas of
fibroglandular density.
FINDINGS: In the right breast, calcifications warrant further evaluation with
magnified views. In the left breast, no findings suspicious for
malignancy. Images were processed with CAD.
IMPRESSION: Further evaluation is suggested for calcifications in the right
breast.

RECOMMENDATION:
Diagnostic mammogram of the right breast. (Code:[8P])

The patient will be contacted regarding the findings, and additional
imaging will be scheduled.

BI-RADS CATEGORY  0: Incomplete. Need additional imaging evaluation
and/or prior mammograms for comparison.

## 2017-11-14 ENCOUNTER — Other Ambulatory Visit: Payer: Self-pay | Admitting: Family

## 2017-11-14 DIAGNOSIS — R928 Other abnormal and inconclusive findings on diagnostic imaging of breast: Secondary | ICD-10-CM

## 2017-11-14 DIAGNOSIS — R921 Mammographic calcification found on diagnostic imaging of breast: Secondary | ICD-10-CM

## 2017-11-21 ENCOUNTER — Ambulatory Visit
Admission: RE | Admit: 2017-11-21 | Discharge: 2017-11-21 | Disposition: A | Discharge status: Home / Self Care | Payer: BC Managed Care – PPO | Source: Ambulatory Visit | Attending: Family | Admitting: Family

## 2017-11-21 DIAGNOSIS — R921 Mammographic calcification found on diagnostic imaging of breast: Secondary | ICD-10-CM | POA: Diagnosis not present

## 2017-11-21 DIAGNOSIS — R928 Other abnormal and inconclusive findings on diagnostic imaging of breast: Secondary | ICD-10-CM

## 2017-11-21 IMAGING — MG DIGITAL DIAGNOSTIC UNILATERAL RIGHT MAMMOGRAM
3 series · 3 of 3 positions shown · non-contrast
Comparison: [DATE] and prior mammograms dated [DATE],
[DATE], [DATE], [DATE], [DATE]

CLINICAL DATA: 71-year-old patient recalled from recent screening
mammogram for evaluation of calcifications in the right breast. She
Pathology showed benign breast tissue with focal hyalinized stroma
and benign breast tissue with focal fibroadenomatous change.

EXAM:
DIGITAL DIAGNOSTIC RIGHT MAMMOGRAM

[R ML (1 of 2)]
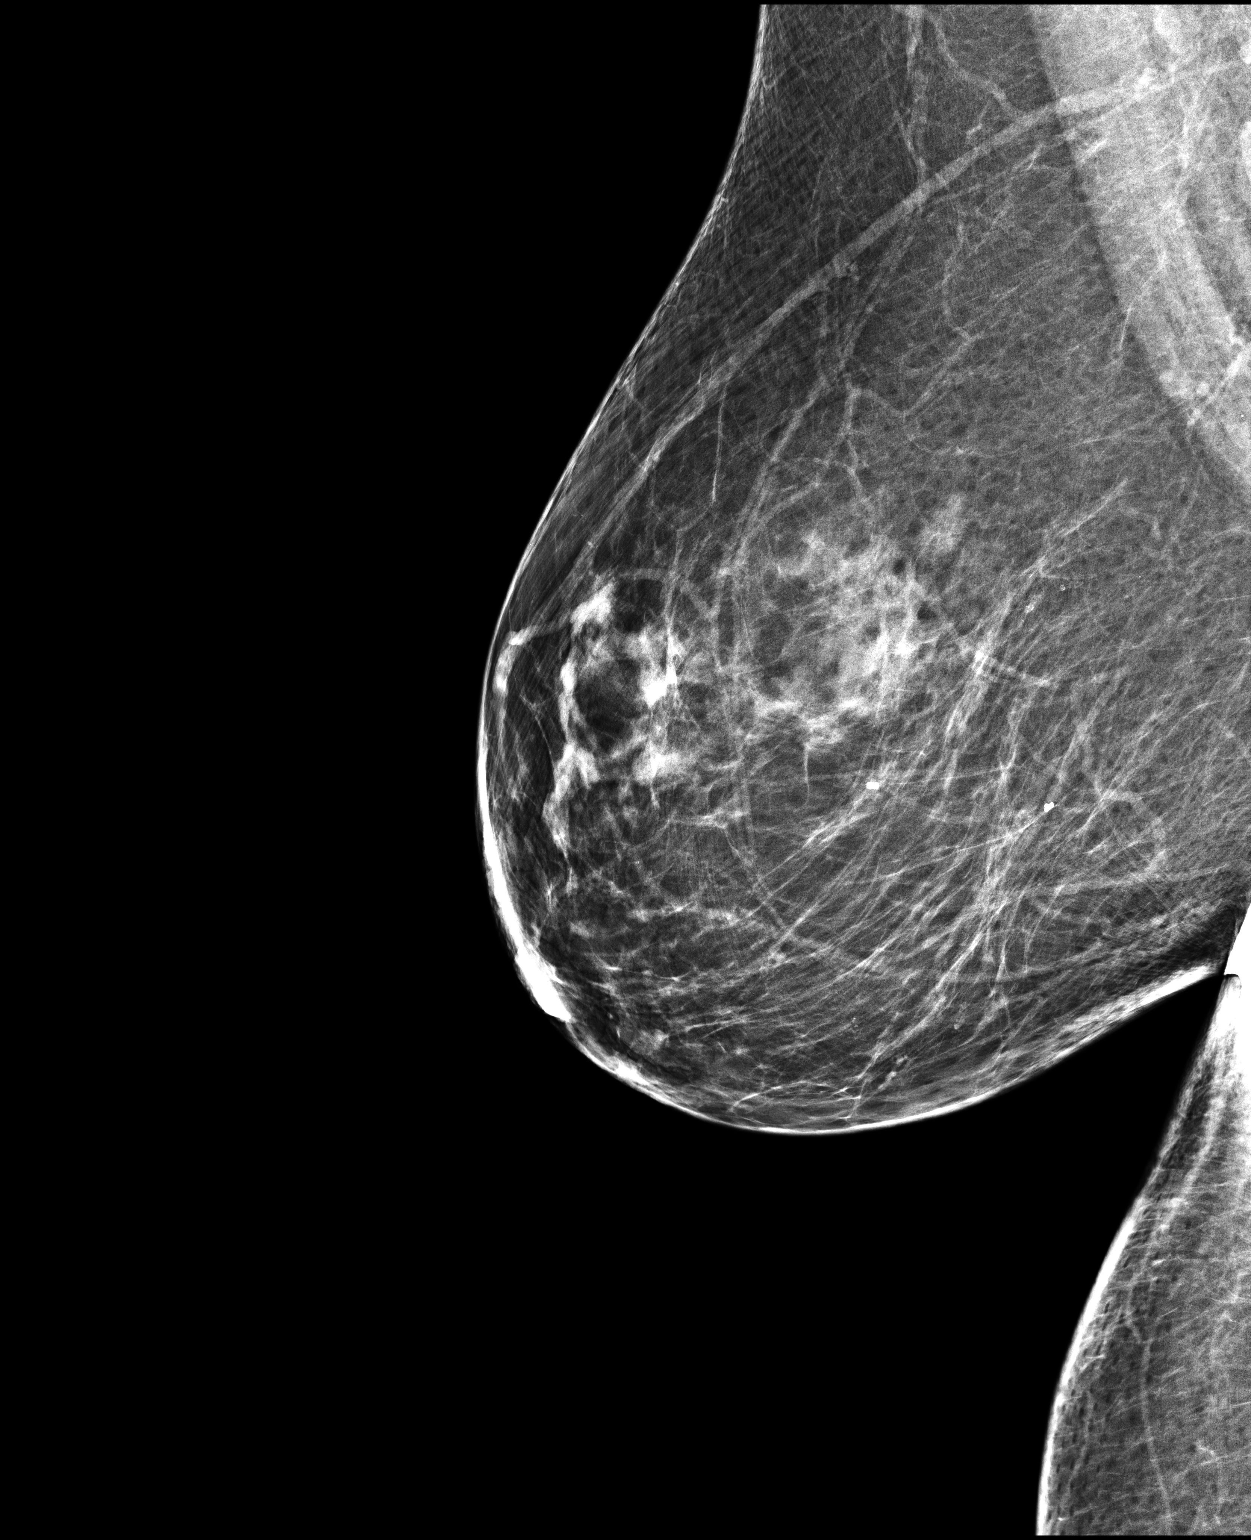

[R ML (2 of 2)]
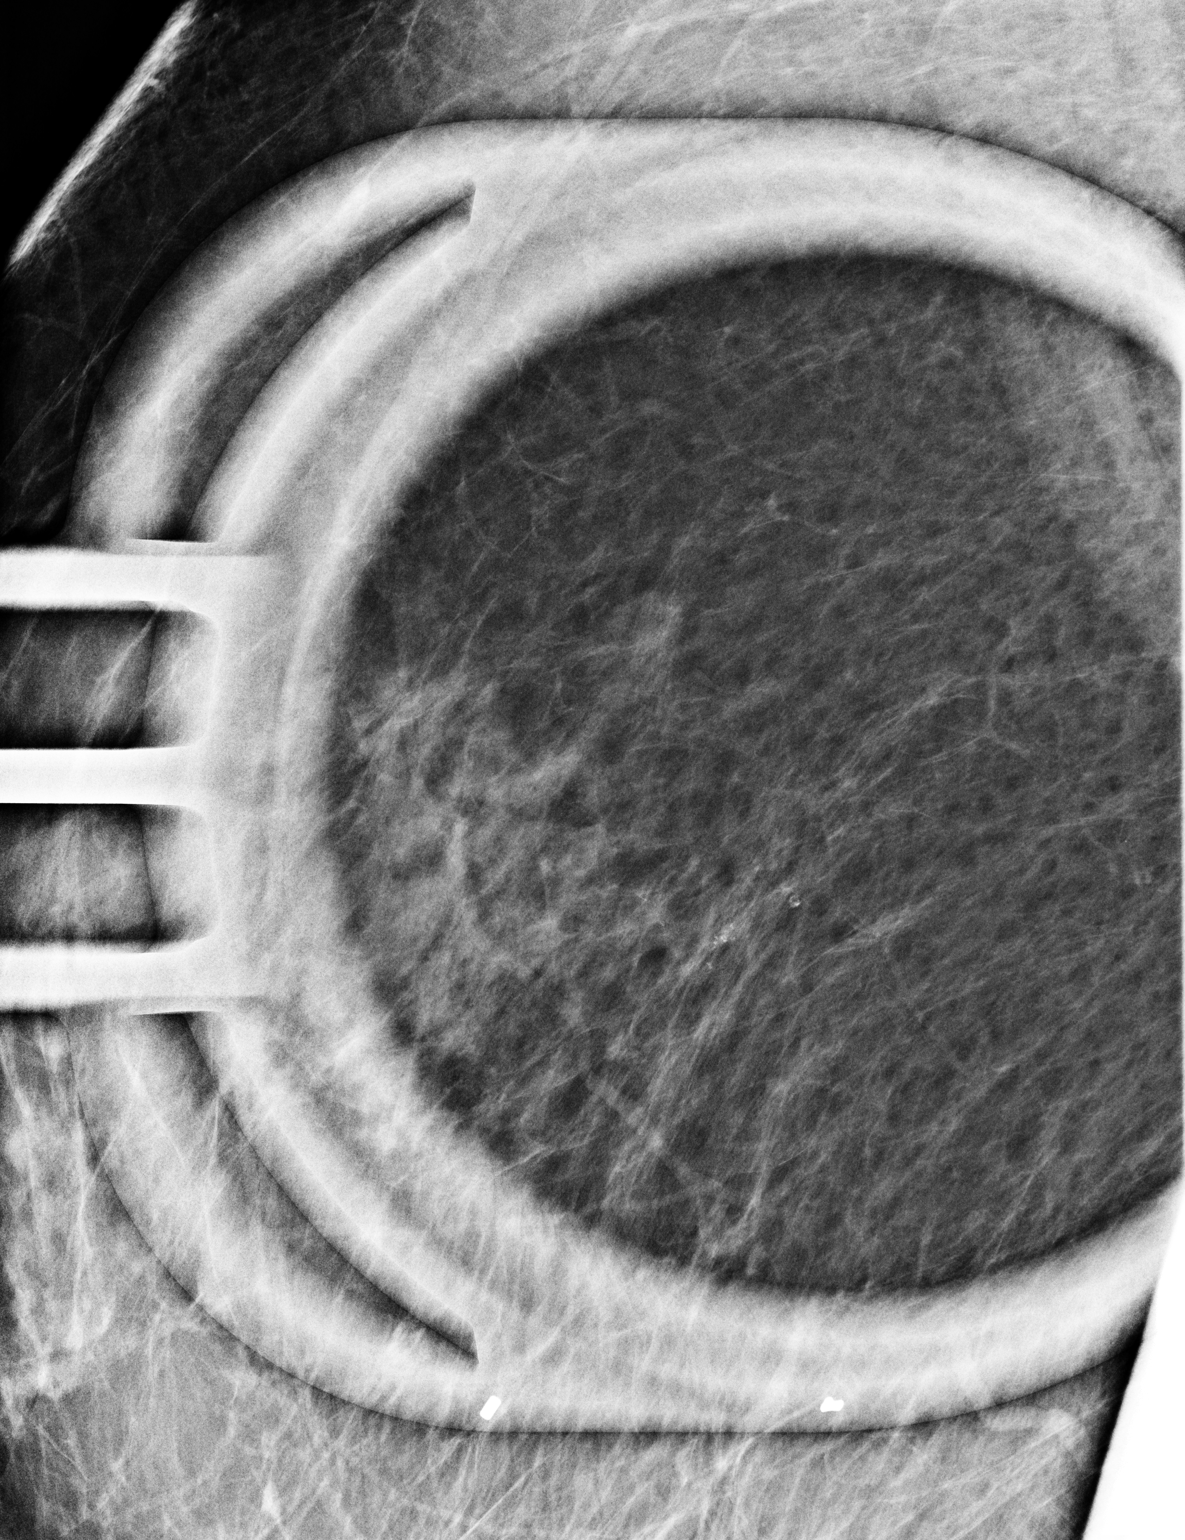

[R CC]
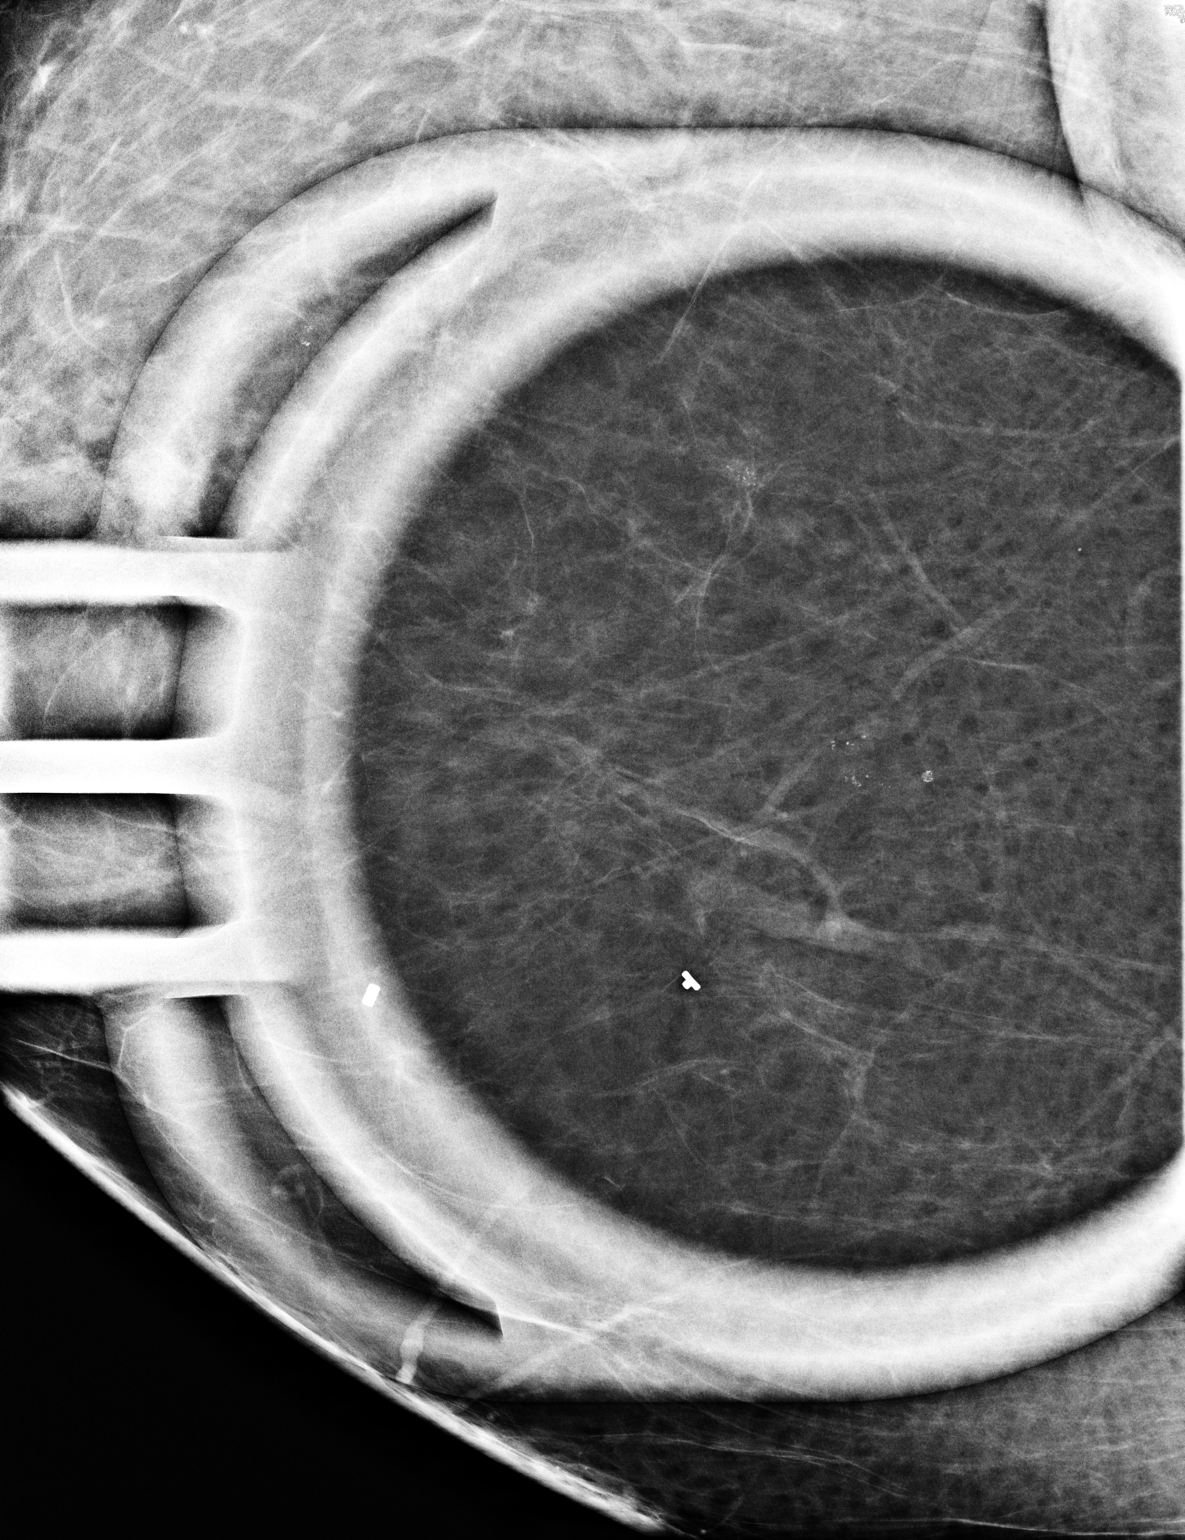

[3 of 3 positions shown; findings below may reference images not displayed]

ACR Breast Density Category b: There are scattered areas of
fibroglandular density.
FINDINGS: Magnification views of the posterior third of the upper inner right
breast show a 5 mm group of predominantly punctate calcifications.
Immediately deep to these calcifications is a rounded coarse benign
dystrophic calcification(s) suggestive of benign fat necrosis. On
review of multiple prior mammograms, the 5 mm group of
calcifications evaluated today appears similar compared to priors,
given differences in technique. There is no associated mass or
distortion.
IMPRESSION: Probably benign 5 mm group of calcifications in the upper inner
quadrant of the right breast.

RECOMMENDATION:
Diagnostic right mammogram with magnification views to allow for
direct comparison with current imaging techniques is recommended.

I have discussed the findings and recommendations with the patient.
Results were also provided in writing at the conclusion of the
visit. If applicable, a reminder letter will be sent to the patient
regarding the next appointment.

BI-RADS CATEGORY  3: Probably benign.

## 2017-11-30 ENCOUNTER — Telehealth: Payer: Self-pay | Admitting: Family

## 2017-11-30 ENCOUNTER — Other Ambulatory Visit: Payer: Self-pay | Admitting: Family

## 2017-11-30 DIAGNOSIS — N6310 Unspecified lump in the right breast, unspecified quadrant: Secondary | ICD-10-CM

## 2017-11-30 DIAGNOSIS — R921 Mammographic calcification found on diagnostic imaging of breast: Secondary | ICD-10-CM

## 2017-11-30 NOTE — Progress Notes (Signed)
Spoke with [place] orders are entered in correctly , patient is scheduled for 6 month follow up 05/24/18 @ 10:40 am.

## 2017-11-30 NOTE — Progress Notes (Signed)
Patient advised of appointment and verbalized understanding

## 2017-11-30 NOTE — Telephone Encounter (Signed)
close

## 2017-11-30 NOTE — Progress Notes (Signed)
Left voice mail to call back ok for PEC to speak to patient 

## 2017-11-30 NOTE — Progress Notes (Unsigned)
Call pt I can see she had [place] of right breast 11/21/17. It recommends a right breast diagnostic mammogram ( this is different than the screening she had on 11/10/17.   I am not sure where patient had the [place] in 62months. I do not see that recommendation and want to be sure we are on the same page.   Cyril Mourning, would you mind calling [place] to clarify?    PER result, it states: RECOMMENDATION: Diagnostic right mammogram with magnification views to allow for direct comparison with current imaging techniques is recommended.    [name], would you schedule the diagnostic mammogram?

## 2017-12-15 ENCOUNTER — Telehealth: Payer: Self-pay | Admitting: Family

## 2017-12-15 DIAGNOSIS — R110 Nausea: Secondary | ICD-10-CM

## 2017-12-15 NOTE — Telephone Encounter (Signed)
Copied from [place] [id number]. Topic: Quick Communication - Rx Refill/Question >> Dec 15, 2017  9:00 AM Cleaster Corin, NT wrote: Medication:dramamine patch  Has the patient contacted their pharmacy? yes (Agent: If no, request that the patient contact the pharmacy for the refill.) Preferred Pharmacy (with phone number or street name): [facility] [address], [place] - [place] [address] Phone: [phone] Fax: [phone]   Agent: Please be advised that RX refills may take up to 3 business days. We ask that you follow-up with your pharmacy.

## 2017-12-16 MED ORDER — SCOPOLAMINE 1 MG/3DAYS TD PT72: 1.0000 | MEDICATED_PATCH | TRANSDERMAL | 0 refills | Status: DC

## 2017-12-16 NOTE — Telephone Encounter (Signed)
Patient advised of below and verbalized understanding.  

## 2017-12-16 NOTE — Telephone Encounter (Signed)
Please advise I didn't see this medication in patients past medications either.

## 2017-12-16 NOTE — Telephone Encounter (Signed)
Dramamine patch refill request - don't see where she has been on this medication  LOV 05/02/17 with [name]  [facility] [address] [place].

## 2017-12-16 NOTE — Telephone Encounter (Signed)
Spoke with patient she states she will be going on a cruise that is the reason for the request.

## 2017-12-16 NOTE — Telephone Encounter (Signed)
Left voicemail for patient to call ? Need for medication, will forward request to [facility]

## 2017-12-16 NOTE — Telephone Encounter (Signed)
Call pt Scopolamine Patch sent in im unaware of a 'dramamine' version that pt requested

## 2018-05-12 ENCOUNTER — Ambulatory Visit (INDEPENDENT_AMBULATORY_CARE_PROVIDER_SITE_OTHER): Payer: Medicare Other | Admitting: Family

## 2018-05-12 ENCOUNTER — Encounter: Payer: Self-pay | Admitting: Family

## 2018-05-12 VITALS — BP 118/66 | HR 80 | Temp 98.4°F | Resp 15 | Ht 63.0 in | Wt 207.5 lb

## 2018-05-12 DIAGNOSIS — Z122 Encounter for screening for malignant neoplasm of respiratory organs: Secondary | ICD-10-CM

## 2018-05-12 DIAGNOSIS — R5383 Other fatigue: Secondary | ICD-10-CM | POA: Diagnosis not present

## 2018-05-12 DIAGNOSIS — F419 Anxiety disorder, unspecified: Secondary | ICD-10-CM

## 2018-05-12 DIAGNOSIS — K760 Fatty (change of) liver, not elsewhere classified: Secondary | ICD-10-CM | POA: Diagnosis not present

## 2018-05-12 DIAGNOSIS — Z23 Encounter for immunization: Secondary | ICD-10-CM

## 2018-05-12 DIAGNOSIS — Z0000 Encounter for general adult medical examination without abnormal findings: Secondary | ICD-10-CM

## 2018-05-12 DIAGNOSIS — M2550 Pain in unspecified joint: Secondary | ICD-10-CM | POA: Diagnosis not present

## 2018-05-12 DIAGNOSIS — E1165 Type 2 diabetes mellitus with hyperglycemia: Secondary | ICD-10-CM | POA: Diagnosis not present

## 2018-05-12 MED ORDER — METFORMIN HCL ER 500 MG PO TB24: ORAL_TABLET | ORAL | 3 refills | Status: DC

## 2018-05-12 MED ORDER — ALPRAZOLAM 0.25 MG PO TABS: 0.2500 mg | ORAL_TABLET | Freq: Two times a day (BID) | ORAL | 0 refills | Status: DC | PRN

## 2018-05-12 MED ORDER — SIMVASTATIN 20 MG PO TABS: 20.0000 mg | ORAL_TABLET | Freq: Every day | ORAL | 3 refills | Status: DC

## 2018-05-12 NOTE — Assessment & Plan Note (Signed)
Start metformin.

## 2018-05-12 NOTE — Assessment & Plan Note (Signed)
CBE performed. Declines pelvic exam in the absence of complaints.

## 2018-05-12 NOTE — Progress Notes (Signed)
Subjective:    Patient ID: Kathy Bryant, female    DOB: December 02, 1945, 72 y.o.   MRN: [id number]  CC: Kathy Bryant is a 72 y.o. female who presents today for physical exam.    HPI: Complains of fatigue x 6-8 months. Not walking anymore. Frustrated by weight. Eating habits have not changed.  Interested in b12 shots for energy. Would like to be more motivated to go walking again.   Sleeps well. Wakes up feeling refreshed.   Complains of left shoulder and neck pain, more recently pain in buttocks. resolved with improved pillow and massage therapy. Then feels like 'pain moves around ' . No pain when on regular exercise , such as walking in the past.   GAD- uses xanax very rarely as needed.   Prediabetic- no medication.   HLD-   No medication.      Colorectal Cancer Screening: Due 2020 Breast Cancer Screening: Diagnostic mammogram follow up scheduled 10/22.  Cervical Cancer Screening: h/o hysterectomy for fibroids- No cancer.  No vaginal bleeding.  Bone Health screening/DEXA for 65+: Due Lung Cancer Screening: Doesn't have 30 year pack year history and age > 10 years       Tetanus - utd        Pneumococcal - Candidate for. consents Hepatitis C screening - Candidate for, consents.  Labs: Screening labs today. Exercise: No regular exercise.  Alcohol use: occasional Smoking/tobacco use: former smoker, quit 2008.  Wears seat belt: Yes. Skin: had seen [place] in the past.   HISTORY:  Past Medical History:  Diagnosis Date  . Anxiety   . Diabetes mellitus without complication ([place])    PT STATES SHE WAS ON METFORMIN AND STOPPED TAKING THIS ON HER OWN  . Fatty liver   . 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 Herniated disc, cervical   . HTN (hypertension)    PT WAS ON BP MEDS AND THEN STATES HER BP WAS UNDER CONTROL AND SHE STOPPED TAKING BP ON HER OWN  . Microscopic hematuria   . Obesity     Past Surgical History:  Procedure Laterality Date  . APPENDECTOMY  1968  . BREAST  BIOPSY Right 2013   x2 BENIGN BREAST TISSUE WITH FOCAL HYALINIZED STROMA AND BENIGN BREAST TISSUE WITH FOCAL FIBROADENOMATOUS CHANGE   . [place]  . CHOLECYSTECTOMY    . COLONOSCOPY  03/05/2014  . [place]  . HEMORRHOID SURGERY N/A 12/23/2016   Procedure: HEMORRHOIDECTOMY;  Surgeon: Christene Lye, MD;  Location: [facility] ORS;  Service: General;  Laterality: N/A;  . HERNIA REPAIR     Abdominal  . TOTAL ABDOMINAL HYSTERECTOMY W/ BILATERAL SALPINGOOPHORECTOMY  1998   Family History  Problem Relation Age of Onset  . Diabetes Mother   . Cancer Mother 57       Brain Tumor - died age 31  . Heart disease Father        CAD - died MI - age 103  . Hypertension Father   . Diabetes Sister   . Diabetes Brother   . Diabetes Brother   . Cancer Paternal Aunt        Renal Cell Ca  . Cancer Paternal Uncle        Renal Cell Ca  . Breast cancer Neg Hx       ALLERGIES: Patient has no known allergies.  Current Outpatient Medications on File Prior to Visit  Medication Sig Dispense Refill  . neomycin-polymyxin b-dexamethasone (  MAXITROL) 3.5-10000-0.1 SUSP Place 1 drop into both eyes 2 (two) times daily as needed (FOR EYE IRRITATION/INFECTION).     No current facility-administered medications on file prior to visit.     Social History   Tobacco Use  . Smoking status: Former Smoker    Types: Cigarettes    Last attempt to quit: 12/23/2006    Years since quitting: 11.3  . Smokeless tobacco: Never Used  . Tobacco comment: "I WAS A CLOSET SMOKER AND NEVER SMOKED ALOT"  Substance Use Topics  . Alcohol use: Yes    Comment: Occasional glass of wine  . Drug use: No    Review of Systems  Constitutional: Positive for activity change and fatigue. Negative for chills, fever and unexpected weight change.  HENT: Negative for congestion.   Respiratory: Negative for cough.   Cardiovascular: Negative for chest pain, palpitations and leg swelling.    Gastrointestinal: Negative for nausea and vomiting.  Genitourinary: Negative for dysuria and pelvic pain.  Musculoskeletal: Positive for arthralgias. Negative for myalgias.  Skin: Negative for rash.  Neurological: Negative for headaches.  Hematological: Negative for adenopathy.  Psychiatric/Behavioral: Negative for confusion, sleep disturbance and suicidal ideas. The patient is nervous/anxious.       Objective:    BP 118/66 (BP Location: Left Arm, Patient Position: Sitting, Cuff Size: Large)   Pulse 80   Temp 98.4 F (36.9 C) (Oral)   Resp 15   Ht 5\' 3"  (1.6 m)   Wt 207 lb 8 oz (94.1 kg)   SpO2 97%   BMI 36.76 kg/m   BP Readings from Last 3 Encounters:  05/12/18 118/66  05/02/17 126/72  12/30/16 130/90   Wt Readings from Last 3 Encounters:  05/12/18 207 lb 8 oz (94.1 kg)  05/02/17 191 lb 6.4 oz (86.8 kg)  12/30/16 185 lb (83.9 kg)    Physical Exam  Constitutional: She appears well-developed and well-nourished.  Eyes: Conjunctivae are normal.  Neck: No thyroid mass and no thyromegaly present.  Cardiovascular: Normal rate, regular rhythm, normal heart sounds and normal pulses.  Pulmonary/Chest: Effort normal and breath sounds normal. She has no wheezes. She has no rhonchi. She has no rales. Right breast exhibits no inverted nipple, no mass, no nipple discharge, no skin change and no tenderness. Left breast exhibits no inverted nipple, no mass, no nipple discharge, no skin change and no tenderness. Breasts are symmetrical.  CBE performed.   Lymphadenopathy:       Head (right side): No submental, no submandibular, no tonsillar, no preauricular, no posterior auricular and no occipital adenopathy present.       Head (left side): No submental, no submandibular, no tonsillar, no preauricular, no posterior auricular and no occipital adenopathy present.    She has no cervical adenopathy.       Right cervical: No superficial cervical, no deep cervical and no posterior cervical  adenopathy present.      Left cervical: No superficial cervical, no deep cervical and no posterior cervical adenopathy present.    She has no axillary adenopathy.  Neurological: She is alert.  Skin: Skin is warm and dry.  Psychiatric: She has a normal mood and affect. Her speech is normal and behavior is normal. Thought content normal.  Vitals reviewed.      Assessment & Plan:   Problem List Items Addressed This Visit      Digestive   Fatty liver disease, nonalcoholic    Pending [place] 01/629 ;  Discussed fatty liver lifestyle modifications including  low trans fat diet, maintaining a healthy weight. Emphasized importance of keeping cholesterol, BP, and A1c at goal. Declines sleep study today. Will continue to follow.       Relevant Orders   US ABDOMEN LIMITED RUQ     Endocrine   DM (diabetes mellitus) ([place])    Start metformin       Relevant Medications   simvastatin (ZOCOR) 20 MG tablet   metFORMIN (GLUCOPHAGE XR) 500 MG 24 hr tablet     Other   Routine general medical examination at a health care facility    CBE performed. Declines pelvic exam in the absence of complaints.       Fatigue    Acute on chronic. Suspect multifactorial, lack of exercise.  Advised to start exercise program, have also placed a referral to medical weight loss management.  Declines sleep study.Pending labs.       Anxiety    Rare use, will continue at this time.      Relevant Medications   ALPRAZolam (XANAX) 0.25 MG tablet   Arthralgia    Diffuse, migratory. Large joints. Suspect OA may be contributory. No today. Resolved at this time. Declines imaging or autoimmune serologies.  Patient let me know if symptoms recurs, at that time will provide further evaluation.        Other Visit Diagnoses    Routine physical examination    -  Primary   Relevant Medications   simvastatin (ZOCOR) 20 MG tablet   metFORMIN (GLUCOPHAGE XR) 500 MG 24 hr tablet   Other Relevant Orders   Ambulatory referral to  Gastroenterology   DG Bone Density   CT CHEST LUNG CANCER SCREENING LOW DOSE WO CONTRAST   CBC with Differential/Platelet   Comprehensive metabolic panel   Hemoglobin A1c   Lipid panel   TSH   VITAMIN D 25 Hydroxy (Vit-D Deficiency, Fractures)   B12 and Folate Panel   Amb Ref to Medical Weight Management   Ambulatory referral to Dermatology   Encounter for screening for malignant neoplasm of respiratory organs       Relevant Orders   CT CHEST LUNG CANCER SCREENING LOW DOSE WO CONTRAST   Need for pneumococcal vaccination       Relevant Orders   Pneumococcal polysaccharide vaccine 23-valent greater than or equal to 2yo subcutaneous/IM (Completed)       I have discontinued Sharyon R. Sobotta's hydrocortisone, ibuprofen, dibucaine, and scopolamine. I am also having her start on simvastatin and metFORMIN. Additionally, I am having her maintain her neomycin-polymyxin b-dexamethasone and ALPRAZolam.   Meds ordered this encounter  Medications  . ALPRAZolam (XANAX) 0.25 MG tablet    Sig: Take 1 tablet (0.25 mg total) by mouth 2 (two) times daily as needed for anxiety.    Dispense:  30 tablet    Refill:  0    Order Specific Question:   Supervising Provider    Answer:   Deborra Medina L [[id number]]  . simvastatin (ZOCOR) 20 MG tablet    Sig: Take 1 tablet (20 mg total) by mouth at bedtime.    Dispense:  90 tablet    Refill:  3    Order Specific Question:   Supervising Provider    Answer:   Deborra Medina L [[id number]]  . metFORMIN (GLUCOPHAGE XR) 500 MG 24 hr tablet    Sig: Start 500mg  PO qpm.    Dispense:  90 tablet    Refill:  3    Order Specific Question:   Supervising  Provider    Answer:   Crecencio Mc [[id number]]    Return precautions given.   Risks, benefits, and alternatives of the medications and treatment plan prescribed today were discussed, and patient expressed understanding.   Education regarding symptom management and diagnosis given to patient on AVS.   Continue to follow  with Burnard Hawthorne, FNP for routine health maintenance.   Kathy Bryant and I agreed with plan.   [name], FNP

## 2018-05-12 NOTE — Assessment & Plan Note (Addendum)
Diffuse, migratory. Large joints. Suspect OA may be contributory. No today. Resolved at this time. Declines imaging or autoimmune serologies.  Patient let me know if symptoms recurs, at that time will provide further evaluation.

## 2018-05-12 NOTE — Assessment & Plan Note (Signed)
Rare use, will continue at this time.

## 2018-05-12 NOTE — Patient Instructions (Addendum)
Today we discussed referrals, orders. Dermatology, medical weight loss, bone density, ct chest lung cancer screen, ultrasound of liver   I have placed these orders in the system for you.  Please be sure to give [place] a call if you have not heard from our office regarding this. We should hear from [place] within ONE week with information regarding your appointment. If not, please let me know immediately.    Start metformin  Start zocor once per week   Start a walking program again

## 2018-05-12 NOTE — Assessment & Plan Note (Signed)
Pending [place] 04/2018 ;  Discussed fatty liver lifestyle modifications including low trans fat diet, maintaining a healthy weight. Emphasized importance of keeping cholesterol, BP, and A1c at goal. Declines sleep study today. Will continue to follow.

## 2018-05-12 NOTE — Assessment & Plan Note (Addendum)
Acute on chronic. Suspect multifactorial, lack of exercise.  Advised to start exercise program, have also placed a referral to medical weight loss management.  Declines sleep study.Pending labs.

## 2018-05-16 ENCOUNTER — Other Ambulatory Visit (INDEPENDENT_AMBULATORY_CARE_PROVIDER_SITE_OTHER): Payer: Medicare Other

## 2018-05-16 DIAGNOSIS — Z0000 Encounter for general adult medical examination without abnormal findings: Secondary | ICD-10-CM | POA: Diagnosis not present

## 2018-05-16 LAB — COMPREHENSIVE METABOLIC PANEL
ALT: 40 U/L — AB (ref 0–35)
AST: 28 U/L (ref 0–37)
Albumin: 3.8 g/dL (ref 3.5–5.2)
Alkaline Phosphatase: 64 U/L (ref 39–117)
BILIRUBIN TOTAL: 0.4 mg/dL (ref 0.2–1.2)
BUN: 16 mg/dL (ref 6–23)
CHLORIDE: 104 meq/L (ref 96–112)
CO2: 28 meq/L (ref 19–32)
CREATININE: 0.86 mg/dL (ref 0.40–1.20)
Calcium: 9.5 mg/dL (ref 8.4–10.5)
GFR: 69.17 mL/min (ref >60.00)
Glucose, Bld: 145 mg/dL — ABNORMAL HIGH (ref 70–99)
Potassium: 3.9 mEq/L (ref 3.5–5.1)
Sodium: 140 mEq/L (ref 135–145)
Total Protein: 6.8 g/dL (ref 6.0–8.3)

## 2018-05-16 LAB — LIPID PANEL
Cholesterol: 193 mg/dL (ref 0–200)
HDL: 46.5 mg/dL (ref >39.00)
NONHDL: 146.72
TRIGLYCERIDES: 238 mg/dL — AB (ref 0.0–149.0)
Total CHOL/HDL Ratio: 4
VLDL: 47.6 mg/dL — AB (ref 0.0–40.0)

## 2018-05-16 LAB — CBC WITH DIFFERENTIAL/PLATELET
BASOS ABS: 0 10*3/uL (ref 0.0–0.1)
Basophils Relative: 0.6 % (ref 0.0–3.0)
EOS ABS: 0.1 10*3/uL (ref 0.0–0.7)
Eosinophils Relative: 1.7 % (ref 0.0–5.0)
HEMATOCRIT: 42.2 % (ref 36.0–46.0)
Hemoglobin: 14.2 g/dL (ref 12.0–15.0)
LYMPHS ABS: 2.6 10*3/uL (ref 0.7–4.0)
LYMPHS PCT: 35.3 % (ref 12.0–46.0)
MCHC: 33.7 g/dL (ref 30.0–36.0)
MCV: 95.8 fl (ref 78.0–100.0)
MONO ABS: 0.4 10*3/uL (ref 0.1–1.0)
Monocytes Relative: 5.2 % (ref 3.0–12.0)
NEUTROS ABS: 4.1 10*3/uL (ref 1.4–7.7)
NEUTROS PCT: 57.2 % (ref 43.0–77.0)
PLATELETS: 185 10*3/uL (ref 150.0–400.0)
RBC: 4.4 Mil/uL (ref 3.87–5.11)
RDW: 13.7 % (ref 11.5–15.5)
WBC: 7.2 10*3/uL (ref 4.0–10.5)

## 2018-05-16 LAB — HEMOGLOBIN A1C: Hgb A1c MFr Bld: 6.7 % — ABNORMAL HIGH (ref 4.6–6.5)

## 2018-05-16 LAB — TSH: TSH: 2.22 u[IU]/mL (ref 0.35–4.50)

## 2018-05-16 LAB — LDL CHOLESTEROL, DIRECT: Direct LDL: 105 mg/dL

## 2018-05-16 LAB — VITAMIN D 25 HYDROXY (VIT D DEFICIENCY, FRACTURES): VITD: 14.76 ng/mL — ABNORMAL LOW (ref 30.00–100.00)

## 2018-05-16 LAB — B12 AND FOLATE PANEL
FOLATE: 14.1 ng/mL (ref >5.9)
Vitamin B-12: 251 pg/mL (ref 211–911)

## 2018-05-17 ENCOUNTER — Telehealth: Payer: Self-pay | Admitting: *Deleted

## 2018-05-17 NOTE — Telephone Encounter (Signed)
Received referral for low dose lung cancer screening CT scan. Message left at phone number listed in EMR for patient to call me back to facilitate scheduling scan.  

## 2018-05-17 NOTE — Telephone Encounter (Signed)
Received referral for lung screening scan. Discussed screening with patient and patient reports smoking only .25 packs per day for most of her adult life. Only smoking close to a pack a day the last couple years of her smoking history. Discussed that patient is not eligible for lung screening scan due to < 30 pack year history. Verbalizes understanding.

## 2018-05-19 ENCOUNTER — Ambulatory Visit
Admission: RE | Admit: 2018-05-19 | Discharge: 2018-05-19 | Disposition: A | Discharge status: Home / Self Care | Payer: Medicare Other | Source: Ambulatory Visit | Attending: Family | Admitting: Family

## 2018-05-19 DIAGNOSIS — R7989 Other specified abnormal findings of blood chemistry: Secondary | ICD-10-CM | POA: Diagnosis not present

## 2018-05-19 DIAGNOSIS — Z9049 Acquired absence of other specified parts of digestive tract: Secondary | ICD-10-CM | POA: Diagnosis not present

## 2018-05-19 DIAGNOSIS — K760 Fatty (change of) liver, not elsewhere classified: Secondary | ICD-10-CM | POA: Diagnosis not present

## 2018-05-20 ENCOUNTER — Other Ambulatory Visit: Payer: Self-pay | Admitting: Family

## 2018-05-20 DIAGNOSIS — K7460 Unspecified cirrhosis of liver: Secondary | ICD-10-CM | POA: Insufficient documentation

## 2018-05-24 ENCOUNTER — Other Ambulatory Visit: Payer: BC Managed Care – PPO

## 2018-05-30 ENCOUNTER — Ambulatory Visit
Admission: RE | Admit: 2018-05-30 | Discharge: 2018-05-30 | Disposition: A | Discharge status: Home / Self Care | Payer: Medicare Other | Source: Ambulatory Visit | Attending: Family | Admitting: Family

## 2018-05-30 DIAGNOSIS — R921 Mammographic calcification found on diagnostic imaging of breast: Secondary | ICD-10-CM | POA: Diagnosis not present

## 2018-05-30 DIAGNOSIS — N6310 Unspecified lump in the right breast, unspecified quadrant: Secondary | ICD-10-CM | POA: Insufficient documentation

## 2018-05-30 IMAGING — MG DIGITAL DIAGNOSTIC UNILATERAL RIGHT MAMMOGRAM WITH TOMO AND CAD
8 of 10 series · 8 of 18 positions shown · non-contrast
Comparison: Previous exams including diagnostic mammogram dated
[DATE].

CLINICAL DATA: Follow-up for probably benign RIGHT breast
calcifications.

EXAM:
DIGITAL DIAGNOSTIC UNILATERAL RIGHT MAMMOGRAM WITH CAD AND TOMO

[R CC (1 of 3)]
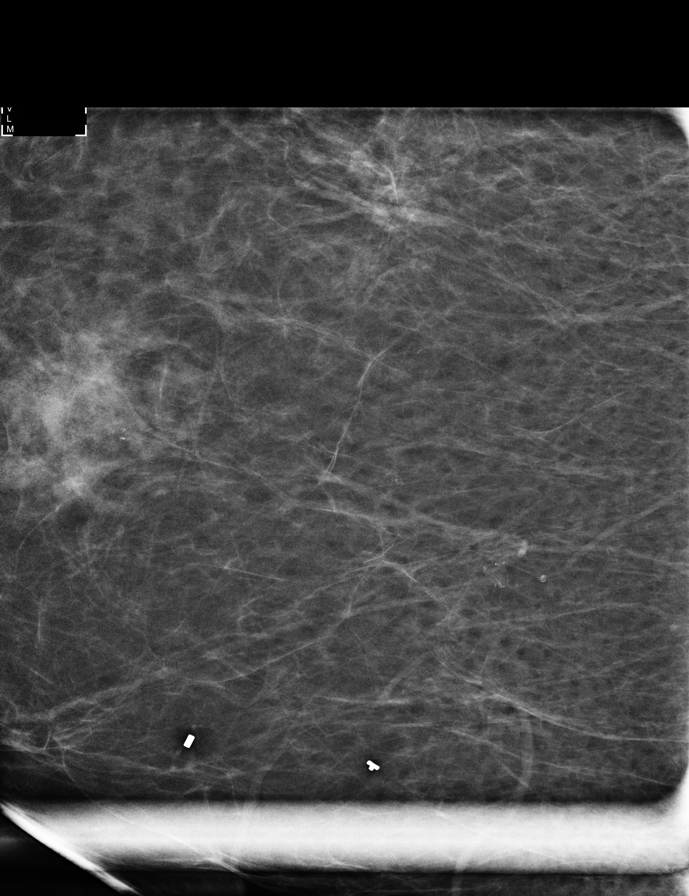

[R ML (1 of 2)]
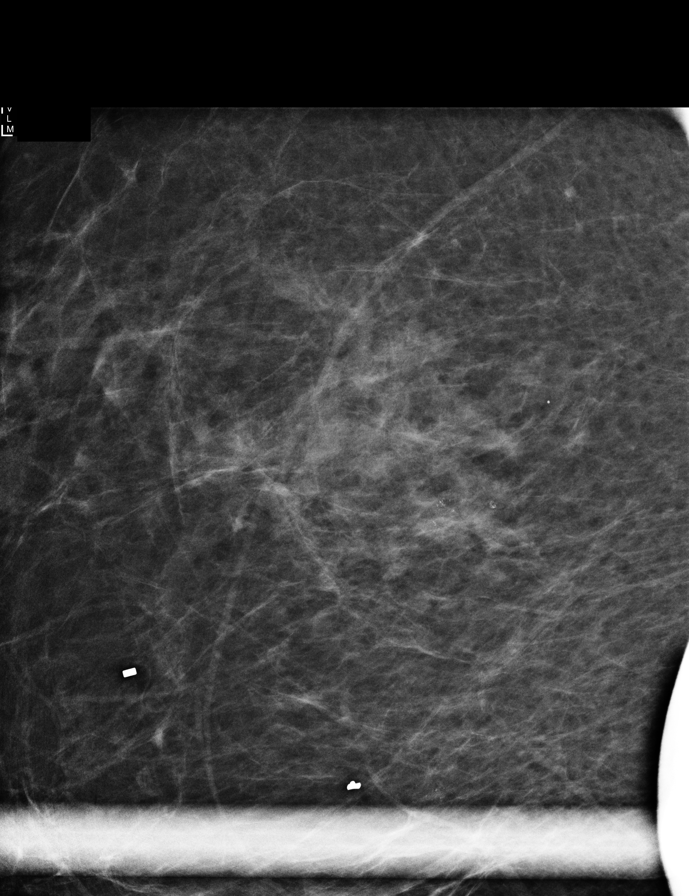

[R MLO synth-2D]
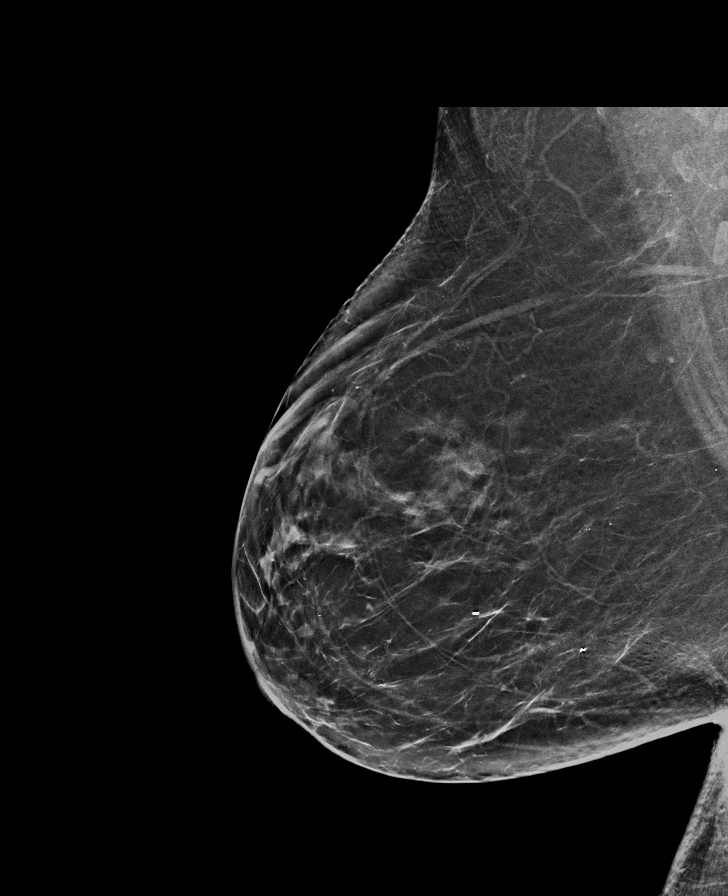

[R CC synth-2D]
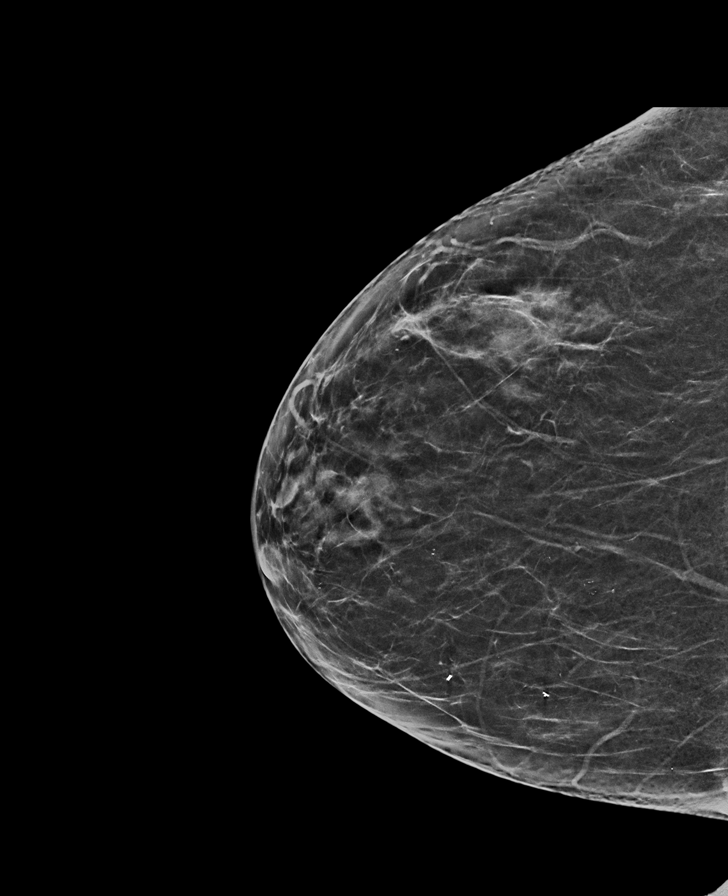

[R CC (2 of 3)]
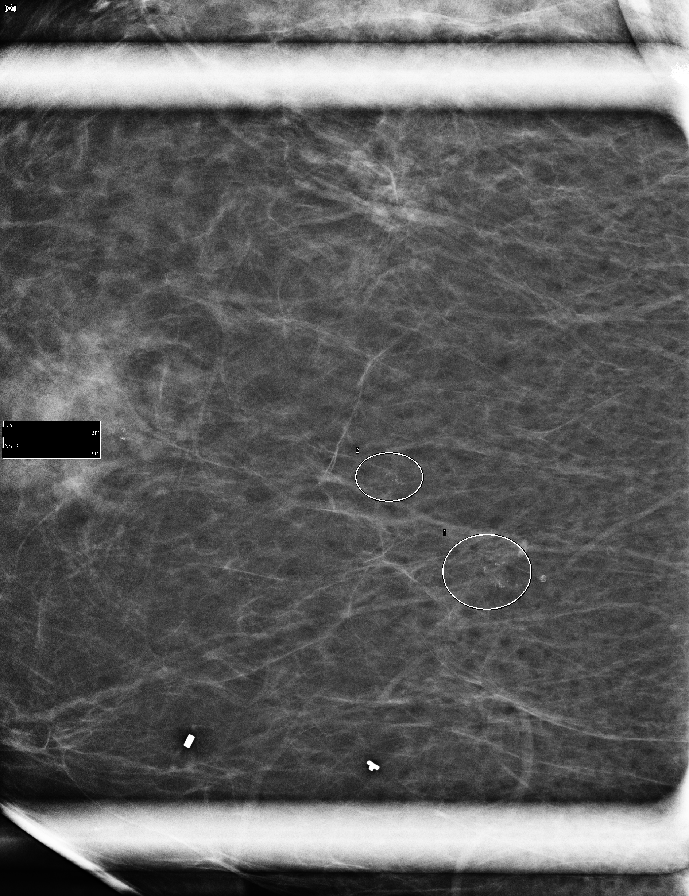

[R ML (2 of 2)]
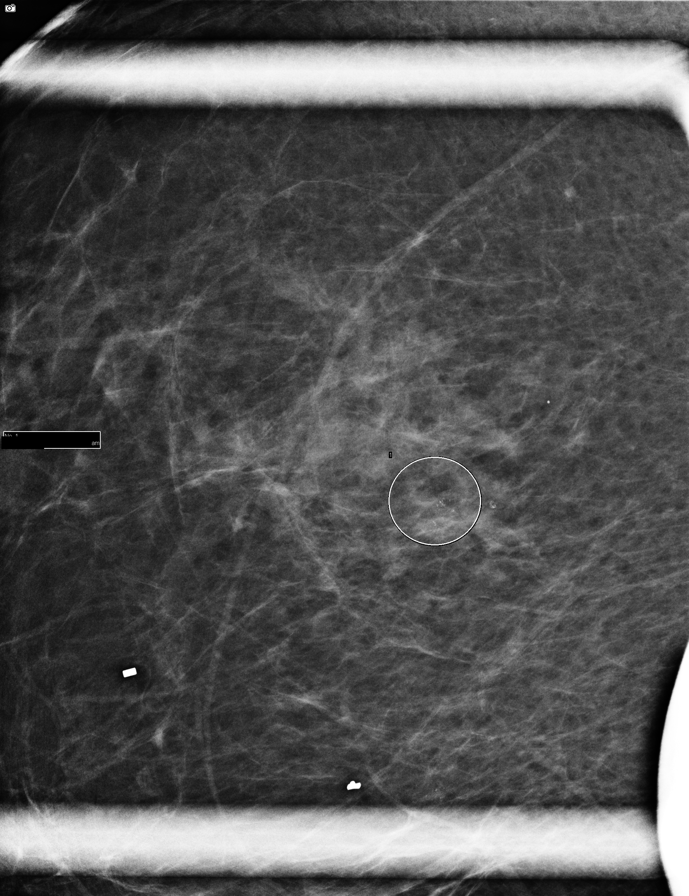

[R MLO]
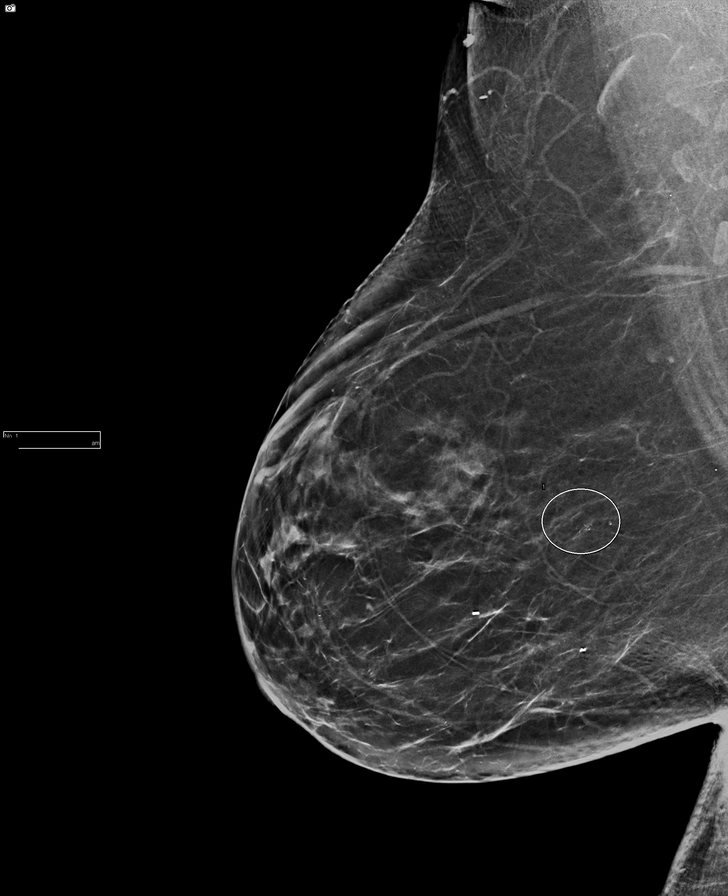

[R CC (3 of 3)]
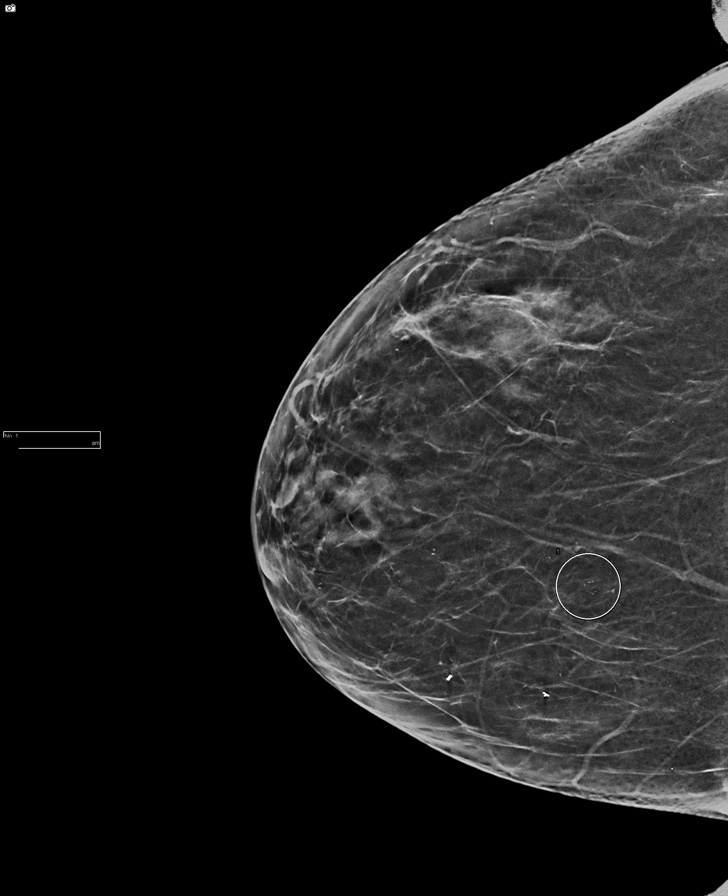

[8 of 18 positions shown; findings below may reference images not displayed]

ACR Breast Density Category b: There are scattered areas of
fibroglandular density.
FINDINGS: The grouped punctate calcifications within slightly inner RIGHT
breast, at posterior depth, are stable. There are no new dominant
masses, suspicious calcifications or secondary signs of malignancy
within the RIGHT breast on today's exam

Mammographic images were processed with CAD.
IMPRESSION: Stable probably benign punctate calcifications within the slightly
RIGHT breast. Recommend additional follow-up diagnostic mammogram in
6 months to ensure continued stability, which will be performed as a
bilateral diagnostic mammogram in conjunction with patient's routine
LEFT breast screening mammogram schedule.

RECOMMENDATION:
Bilateral diagnostic mammogram, with RIGHT breast magnification
views, in 6 months.

I have discussed the findings and recommendations with the patient.
Results were also provided in writing at the conclusion of the
visit. If applicable, a reminder letter will be sent to the patient
regarding the next appointment.

BI-RADS CATEGORY  3: Probably benign.

## 2018-05-31 ENCOUNTER — Other Ambulatory Visit: Payer: Self-pay | Admitting: Family

## 2018-05-31 ENCOUNTER — Encounter: Payer: Self-pay | Admitting: *Deleted

## 2018-05-31 DIAGNOSIS — N6310 Unspecified lump in the right breast, unspecified quadrant: Secondary | ICD-10-CM

## 2018-06-01 ENCOUNTER — Encounter (INDEPENDENT_AMBULATORY_CARE_PROVIDER_SITE_OTHER): Payer: BC Managed Care – PPO

## 2018-06-07 ENCOUNTER — Ambulatory Visit (INDEPENDENT_AMBULATORY_CARE_PROVIDER_SITE_OTHER): Payer: BC Managed Care – PPO | Admitting: Family Medicine

## 2018-06-07 ENCOUNTER — Telehealth: Payer: Self-pay | Admitting: Family

## 2018-06-07 ENCOUNTER — Encounter (INDEPENDENT_AMBULATORY_CARE_PROVIDER_SITE_OTHER): Payer: Self-pay

## 2018-06-07 DIAGNOSIS — Z0289 Encounter for other administrative examinations: Secondary | ICD-10-CM

## 2018-06-07 NOTE — Telephone Encounter (Signed)
sopke with pt regarding US findings of cirrhosis  Questions answered  She understands importance of seeing GI for further eval, surveillance.   Seeing Dr Allen Norris 07/25/18  [name],  Can she be seen sooner with GI due to finding of cirrhosis? She would like to be seen sooner which I think is appropriate. She cannot do 11/14 FYI.

## 2018-06-09 NOTE — Telephone Encounter (Signed)
She has been rescheduled to Nov. 7 with Dr. Allen Norris. I have sent her a mychart message since she is active. Thanks! [name]

## 2018-06-09 NOTE — Telephone Encounter (Signed)
noted 

## 2018-06-12 ENCOUNTER — Encounter (INDEPENDENT_AMBULATORY_CARE_PROVIDER_SITE_OTHER): Payer: Self-pay | Admitting: Bariatrics

## 2018-06-12 ENCOUNTER — Ambulatory Visit (INDEPENDENT_AMBULATORY_CARE_PROVIDER_SITE_OTHER): Payer: Medicare Other | Admitting: Bariatrics

## 2018-06-12 VITALS — BP 128/76 | HR 57 | Temp 98.0°F | Ht 63.0 in | Wt 205.0 lb

## 2018-06-12 DIAGNOSIS — E119 Type 2 diabetes mellitus without complications: Secondary | ICD-10-CM

## 2018-06-12 DIAGNOSIS — K760 Fatty (change of) liver, not elsewhere classified: Secondary | ICD-10-CM

## 2018-06-12 DIAGNOSIS — R5383 Other fatigue: Secondary | ICD-10-CM

## 2018-06-12 DIAGNOSIS — R0602 Shortness of breath: Secondary | ICD-10-CM | POA: Diagnosis not present

## 2018-06-12 DIAGNOSIS — Z6836 Body mass index (BMI) 36.0-36.9, adult: Secondary | ICD-10-CM

## 2018-06-12 DIAGNOSIS — E559 Vitamin D deficiency, unspecified: Secondary | ICD-10-CM

## 2018-06-12 DIAGNOSIS — Z1331 Encounter for screening for depression: Secondary | ICD-10-CM

## 2018-06-12 DIAGNOSIS — E7849 Other hyperlipidemia: Secondary | ICD-10-CM

## 2018-06-12 MED ORDER — VITAMIN D (ERGOCALCIFEROL) 1.25 MG (50000 UNIT) PO CAPS: 50000.0000 [IU] | ORAL_CAPSULE | ORAL | 0 refills | Status: DC

## 2018-06-12 NOTE — Progress Notes (Signed)
Office: [phone]  /  Fax: [phone]   Dear Kathy Bryant. Arnett, FNP,   Thank you for referring Kathy Bryant to our clinic. The following note includes my evaluation and treatment recommendations.  HPI:   Chief Complaint: Kathy Bryant has been referred by Kathy Bryant. Vidal Schwalbe, FNP for consultation regarding her obesity and obesity related comorbidities.    Kathy Bryant (MR# [id number]) is a 72 y.o. female who presents on 06/12/2018 for obesity evaluation and treatment. Current BMI is Body mass index is 36.31 kg/m.Kathy Bryant has been struggling with her weight for many years and has been unsuccessful in either losing weight, maintaining weight loss, or reaching her healthy weight goal.     Kathy Bryant attended our information session and states she is currently in the action stage of change and ready to dedicate time achieving and maintaining a healthier weight. Kathy Bryant is interested in becoming our patient and working on intensive lifestyle modifications including (but not limited to) diet, exercise and weight loss.    Kathy Bryant states her family eats meals together she thinks her family will eat healthier with  her her desired weight loss is 55 lbs she has been heavy most of  her life she started gaining weight after her first baby her heaviest weight ever was 220 lbs. she has significant food cravings for popcorn and ice cream she snacks frequently in the evenings she is frequently drinking liquids with calories she struggles with emotional eating   History of stomach stapling in 1983 and she still has restriction.   Fatigue Jakiya feels her energy is lower than it should be. This has worsened with weight gain and has not worsened recently. Kathy Bryant admits to daytime somnolence and she denies waking up still tired. Patient is at risk for obstructive sleep apnea. Patent has a history of symptoms of daytime fatigue. Patient generally gets 6 or 7 hours of sleep  per night, and states they generally have restful sleep. Snoring is present. Apneic episodes are not present. Epworth Sleepiness Score is 6  Dyspnea on exertion Kathy Bryant notes increasing shortness of breath with activities and seems to be worsening over time with weight gain. She notes getting out of breath sooner with activity than she used to. This has not gotten worse recently. Kathy Bryant denies orthopnea.  Fatty Liver Disease NAFLD Keymora was diagnosed with fatty liver disease by ultrasound (05/19/18) and she is scheduled to see her gastroenterologist this week. Kathy Bryant (states history of cirrhosis of the liver) has a history of hepatitis B.  Diabetes II Kathy Bryant has a diagnosis of diabetes type II. Kathy Bryant had taken metformin in the past. She denies any hypoglycemic episodes. Last A1c was at 6.7 She is attempting to work on intensive lifestyle modifications including diet, exercise, and weight loss to help control her blood glucose levels.  Vitamin D deficiency Kathy Bryant has a diagnosis of vitamin D deficiency. She is not currently taking vit D and denies nausea, vomiting or muscle weakness.  Hyperlipidemia Kathy Bryant has hyperlipidemia and she is taking Simvastatin two times weekly (increased dreaming). Kathy Bryant is attempting to improve her cholesterol levels with intensive lifestyle modification including a low saturated fat diet, exercise and weight loss. She denies any chest pain, claudication or myalgias.  Depression Screen Kathy Bryant's Food and Mood (modified PHQ-9) score was  Depression screen PHQ 2/9 06/12/2018  Decreased Interest 3  Down, Depressed, Hopeless 0  PHQ - 2 Score 3  Altered sleeping 0  Tired, decreased energy 3  Change in appetite 2  Feeling bad or failure about yourself  1  Trouble concentrating 0  Moving slowly or fidgety/restless 0  Suicidal thoughts 0  PHQ-9 Score 9  Difficult doing work/chores Not difficult at all    ALLERGIES: No Known  Allergies  MEDICATIONS: Current Outpatient Medications on File Prior to Visit  Medication Sig Dispense Refill  . ALPRAZolam (XANAX) 0.25 MG tablet Take 1 tablet (0.25 mg total) by mouth 2 (two) times daily as needed for anxiety. 30 tablet 0  . metFORMIN (GLUCOPHAGE XR) 500 MG 24 hr tablet Start 500mg  PO qpm. 90 tablet 3  . neomycin-polymyxin b-dexamethasone (MAXITROL) 3.5-10000-0.1 SUSP Place 1 drop into both eyes 2 (two) times daily as needed (FOR EYE IRRITATION/INFECTION).    Marland Kitchen simvastatin (ZOCOR) 20 MG tablet Take 1 tablet (20 mg total) by mouth at bedtime. 90 tablet 3   No current facility-administered medications on file prior to visit.     PAST MEDICAL HISTORY: Past Medical History:  Diagnosis Date  . Anxiety   . Bilateral calf pain   . Diabetes mellitus without complication ([place])    PT STATES SHE WAS ON METFORMIN AND STOPPED TAKING THIS ON HER OWN  . Fatty liver   . 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 Herniated disc, cervical   . HTN (hypertension)    PT WAS ON BP MEDS AND THEN STATES HER BP WAS UNDER CONTROL AND SHE STOPPED TAKING BP ON HER OWN  . Microscopic hematuria   . Obesity   . Swelling of both lower extremities     PAST SURGICAL HISTORY: Past Surgical History:  Procedure Laterality Date  . APPENDECTOMY  1968  . BREAST BIOPSY Right 2013   x2 BENIGN BREAST TISSUE WITH FOCAL HYALINIZED STROMA AND BENIGN BREAST TISSUE WITH FOCAL FIBROADENOMATOUS CHANGE   . [place]  . CHOLECYSTECTOMY    . COLONOSCOPY  03/05/2014  . [place]  . HEMORRHOID SURGERY N/A 12/23/2016   Procedure: HEMORRHOIDECTOMY;  Surgeon: Christene Lye, MD;  Location: [facility] ORS;  Service: General;  Laterality: N/A;  . HERNIA REPAIR     Abdominal  . TOTAL ABDOMINAL HYSTERECTOMY W/ BILATERAL SALPINGOOPHORECTOMY  1998    SOCIAL HISTORY: Social History   Tobacco Use  . Smoking status: Former Smoker    Types: Cigarettes    Last attempt to quit: 12/23/2006     Years since quitting: 11.4  . Smokeless tobacco: Never Used  . Tobacco comment: "I WAS A CLOSET SMOKER AND NEVER SMOKED ALOT"  Substance Use Topics  . Alcohol use: Yes    Comment: Occasional glass of wine  . Drug use: No    FAMILY HISTORY: Family History  Problem Relation Age of Onset  . Diabetes Mother   . Cancer Mother 42       Brain Tumor - died age 74  . Heart disease Father        CAD - died MI - age 49  . Hypertension Father   . High Cholesterol Father   . Diabetes Sister   . Diabetes Brother   . Diabetes Brother   . Cancer Paternal Aunt        Renal Cell Ca  . Cancer Paternal Uncle        Renal Cell Ca  . Breast cancer Neg Hx     ROS: Review of Systems  Constitutional: Positive for malaise/fatigue.  Eyes:       + Wear Glasses  or Contacts  Respiratory: Positive for shortness of breath (on exertion).   Cardiovascular: Negative for chest pain and palpitations.       + Leg Cramping  Gastrointestinal: Negative for nausea and vomiting.  Musculoskeletal: Negative for myalgias.       Negative for muscle weakness  Endo/Heme/Allergies: Bruises/bleeds easily (bruising).       Negative for hypoglycemia    PHYSICAL EXAM: Blood pressure 128/76, pulse (!) 57, temperature 98 F (36.7 C), temperature source Oral, height 5\' 3"  (1.6 m), weight 205 lb (93 kg), SpO2 95 %. Body mass index is 36.31 kg/m. Physical Exam  Constitutional: She is oriented to person, place, and time. She appears well-developed and well-nourished.  HENT:  Head: Normocephalic and atraumatic.  Nose: Nose normal.  [place] = 2  Eyes: EOM are normal. No scleral icterus.  Neck: Normal range of motion. Neck supple. No thyromegaly present.  Cardiovascular: Normal rate and regular rhythm.  Pulmonary/Chest: Effort normal. No respiratory distress.  Abdominal: Soft. There is no tenderness.  + Obesity  Musculoskeletal: Normal range of motion.  Range of Motion normal in all 4 extremities  Neurological:  She is alert and oriented to person, place, and time. Coordination normal.  Skin: Skin is warm and dry.  Psychiatric: She has a normal mood and affect.  Vitals reviewed.   RECENT LABS AND TESTS: BMET    Component Value Date/Time   NA 140 05/16/2018 0856   K 3.9 05/16/2018 0856   CL 104 05/16/2018 0856   CO2 28 05/16/2018 0856   GLUCOSE 145 (H) 05/16/2018 0856   BUN 16 05/16/2018 0856   CREATININE 0.86 05/16/2018 0856   CALCIUM 9.5 05/16/2018 0856   GFRNONAA >60 12/23/2016 1203   GFRAA >60 12/23/2016 1203   Lab Results  Component Value Date   HGBA1C 6.7 (H) 05/16/2018   No results found for: INSULIN CBC    Component Value Date/Time   WBC 7.2 05/16/2018 0856   RBC 4.40 05/16/2018 0856   HGB 14.2 05/16/2018 0856   HCT 42.2 05/16/2018 0856   PLT 185.0 05/16/2018 0856   MCV 95.8 05/16/2018 0856   MCHC 33.7 05/16/2018 0856   RDW 13.7 05/16/2018 0856   LYMPHSABS 2.6 05/16/2018 0856   MONOABS 0.4 05/16/2018 0856   EOSABS 0.1 05/16/2018 0856   BASOSABS 0.0 05/16/2018 0856   Iron/TIBC/Ferritin/ %Sat No results found for: IRON, TIBC, FERRITIN, IRONPCTSAT Lipid Panel     Component Value Date/Time   CHOL 193 05/16/2018 0856   TRIG 238.0 (H) 05/16/2018 0856   HDL 46.50 05/16/2018 0856   CHOLHDL 4 05/16/2018 0856   VLDL 47.6 (H) 05/16/2018 0856   LDLCALC 132 (H) 05/02/2017 0949   LDLDIRECT 105.0 05/16/2018 0856   Hepatic Function Panel     Component Value Date/Time   PROT 6.8 05/16/2018 0856   ALBUMIN 3.8 05/16/2018 0856   AST 28 05/16/2018 0856   ALT 40 (H) 05/16/2018 0856   ALKPHOS 64 05/16/2018 0856   BILITOT 0.4 05/16/2018 0856   BILIDIR 0.0 10/18/2013 0938      Component Value Date/Time   TSH 2.22 05/16/2018 0856   TSH 2.59 05/02/2017 0949   TSH 1.83 11/30/2013 0854   Vitamin D Results for Kathy Bryant, Kathy Bryant (MRN [id number]) as of 06/13/2018 07:18  Ref. Range 05/16/2018 08:56  VITD Latest Ref Range: 30.00 - 100.00 ng/mL 14.76 (L)    ECG  shows NSR with  a rate of 61 BPM INDIRECT CALORIMETER done today shows a VO2  of 198 and a REE of 1377.  Her calculated basal metabolic rate is 6063 thus her basal metabolic rate is worse than expected.    ASSESSMENT AND PLAN: Other fatigue - Plan: EKG 12-Lead  Shortness of breath on exertion  Fatty liver  Type 2 diabetes mellitus without complication, without long-term current use of insulin (HCC) - Plan: Insulin, random  Vitamin D deficiency - Plan: Vitamin D, Ergocalciferol, (DRISDOL) 50000 units CAPS capsule  Other hyperlipidemia  Depression screening  Class 2 severe obesity with serious comorbidity and body mass index (BMI) of 36.0 to 36.9 in adult, unspecified obesity type ([place])  PLAN: Fatigue Maicie was informed that her fatigue may be related to obesity, depression or many other causes. Labs will be ordered, and in the meanwhile Evangelina has agreed to work on diet, exercise and weight loss to help with fatigue. Proper sleep hygiene was discussed including the need for 7-8 hours of quality sleep each night. A sleep study was not ordered based on symptoms and Epworth score.  Dyspnea on exertion Florean's shortness of breath appears to be obesity related and exercise induced. She has agreed to work on weight loss and gradually increase exercise slowly up to 150 minutes of activity weekly to treat her exercise induced shortness of breath. If Luceal follows our instructions and loses weight without improvement of her shortness of breath, we will plan to refer to pulmonology. We will monitor this condition regularly. Betsi agrees to this plan.  Fatty Liver Disease NAFLD The goal is to lose weight at least by 5 to 10%. Anaih has agreed to increase exercise slowly over time; ultimately to 150 minutes weekly.   Diabetes II Xaniyah has been given extensive diabetes education by myself today including ideal fasting and post-prandial blood glucose readings, individual ideal Hgb A1c goals and  hypoglycemia prevention. We discussed the importance of good blood sugar control to decrease the likelihood of diabetic complications such as nephropathy, neuropathy, limb loss, blindness, coronary artery disease, and death. We discussed the importance of intensive lifestyle modification including diet, exercise and weight loss as the first line treatment for diabetes. Maha agrees to work on decreasing simple carbohydrates. We discussed metformin today. Keeleigh will follow up at the agreed upon time.  Vitamin D Deficiency Virga was informed that low vitamin D levels contributes to fatigue and are associated with obesity, breast, and colon cancer. She agrees to start to take prescription Vit D @50 ,000 IU every week #4 with no refills and will follow up for routine testing of vitamin D, at least 2-3 times per year. She was informed of the risk of over-replacement of vitamin D and agrees to not increase her dose unless she discusses this with [place] first. Tayte will follow up as directed.  Hyperlipidemia Emoree was informed of the American Heart Association Guidelines emphasizing intensive lifestyle modifications as the first line treatment for hyperlipidemia. We discussed many lifestyle modifications today in depth, and Darci will continue to work on decreasing saturated fats such as fatty red meat, butter and many fried foods. She will also increase vegetables and lean protein in her diet and continue to work on exercise and weight loss efforts. Laurelin will continue to take Simvastatin and follow up at the agreed upon time.  Depression Screen Ronalda had a mildly positive depression screening. Depression is commonly associated with obesity and often results in emotional eating behaviors. We will monitor this closely and work on CBT to help improve the non-hunger eating patterns. Referral to Psychology  may be required if no improvement is seen as she continues in our clinic.  Obesity Shaylie is  currently in the action stage of change and her goal is to continue with weight loss efforts. I recommend Shron begin the structured treatment plan as follows:  She has agreed to follow the Category 1 plan Carylon has been instructed to eventually work up to a goal of 150 minutes of combined cardio and strengthening exercise per week for weight loss and overall health benefits. We discussed the following Behavioral Modification Strategies today: increase H2O intake, no skipping meals, increasing lean protein intake, decreasing simple carbohydrates, increasing vegetables and decrease eating out   She was informed of the importance of frequent follow up visits to maximize her success with intensive lifestyle modifications for her multiple health conditions. She was informed we would discuss her lab results at her next visit unless there is a critical issue that needs to be addressed sooner. Takita agreed to keep her next visit at the agreed upon time to discuss these results.    OBESITY BEHAVIORAL INTERVENTION VISIT  Today's visit was # 1   Starting weight: 205 lbs Starting date: 06/12/18 Today's weight : 205 lbs  Today's date: 06/12/2018 Total lbs lost to date: 0 At least 15 minutes were spent on discussing the following behavioral intervention visit.   ASK: We discussed the diagnosis of obesity with Trinetta R [place] today and Arushi agreed to give [place] permission to discuss obesity behavioral modification therapy today.  ASSESS: Zeinab has the diagnosis of obesity and her BMI today is 36.32 [place] is in the action stage of change   ADVISE: Cinde was educated on the multiple health risks of obesity as well as the benefit of weight loss to improve her health. She was advised of the need for long term treatment and the importance of lifestyle modifications to improve her current health and to decrease her risk of future health problems.  AGREE: Multiple dietary modification  options and treatment options were discussed and  Estefania agreed to follow the recommendations documented in the above note.  ARRANGE: Lema was educated on the importance of frequent visits to treat obesity as outlined per CMS and USPSTF guidelines and agreed to schedule her next follow up appointment today.  Corey Skains, am acting as Location manager for [employer]. Owens Shark, DO

## 2018-06-13 LAB — INSULIN, RANDOM: INSULIN: 21.3 u[IU]/mL (ref 2.6–24.9)

## 2018-06-15 ENCOUNTER — Ambulatory Visit (INDEPENDENT_AMBULATORY_CARE_PROVIDER_SITE_OTHER): Payer: Medicare Other | Admitting: Gastroenterology

## 2018-06-15 ENCOUNTER — Encounter: Payer: Self-pay | Admitting: Gastroenterology

## 2018-06-15 VITALS — BP 131/65 | HR 70 | Ht 63.0 in | Wt 207.0 lb

## 2018-06-15 DIAGNOSIS — K760 Fatty (change of) liver, not elsewhere classified: Secondary | ICD-10-CM | POA: Diagnosis not present

## 2018-06-15 DIAGNOSIS — K7460 Unspecified cirrhosis of liver: Secondary | ICD-10-CM | POA: Diagnosis not present

## 2018-06-15 NOTE — Progress Notes (Signed)
Gastroenterology Consultation  Referring Provider:     Burnard Hawthorne, FNP Primary Care Physician:  Burnard Hawthorne, FNP Primary Gastroenterologist:  Dr. Allen Norris     Reason for Consultation:     Fatty liver and cirrhosis        HPI:   Kathy Bryant is a 72 y.o. y/o female referred for consultation & management of fatty liver and cirrhosis by Dr. Vidal Schwalbe, Yvetta Coder, [place].  This patient comes to see me today after having seen me over the years for colonoscopies.  The patient is not due for repeat colonoscopy this year but is doing in July of next year.  The patient was found to have abnormal liver enzymes with an elevated ALT and imaging that showed her to have findings consistent with cirrhosis.  The ultrasound showed her to have a nodular liver with increased echogenicity consistent with cirrhosis of the liver and fatty infiltration.  The patient reports that she is trying to lose weight and has been heavy most of her adult life.  The patient denies any black stools or bloody stools.  She is also very concerned whether she will need a liver transplant and states that she has multiple siblings who have offered her a live liver donor.  The patient denies any black stools or bloody stools nausea vomiting fevers or chills.  Past Medical History:  Diagnosis Date  . Anxiety   . Bilateral calf pain   . Diabetes mellitus without complication ([place])    PT STATES SHE WAS ON METFORMIN AND STOPPED TAKING THIS ON HER OWN  . Fatty liver   . 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 Herniated disc, cervical   . HTN (hypertension)    PT WAS ON BP MEDS AND THEN STATES HER BP WAS UNDER CONTROL AND SHE STOPPED TAKING BP ON HER OWN  . Microscopic hematuria   . Obesity   . Swelling of both lower extremities     Past Surgical History:  Procedure Laterality Date  . APPENDECTOMY  1968  . BREAST BIOPSY Right 2013   x2 BENIGN BREAST TISSUE WITH FOCAL HYALINIZED STROMA AND BENIGN BREAST TISSUE WITH FOCAL  FIBROADENOMATOUS CHANGE   . [place]  . CHOLECYSTECTOMY    . COLONOSCOPY  03/05/2014  . [place]  . HEMORRHOID SURGERY N/A 12/23/2016   Procedure: HEMORRHOIDECTOMY;  Surgeon: Christene Lye, MD;  Location: [facility] ORS;  Service: General;  Laterality: N/A;  . HERNIA REPAIR     Abdominal  . TOTAL ABDOMINAL HYSTERECTOMY W/ BILATERAL SALPINGOOPHORECTOMY  1998    Prior to Admission medications   Medication Sig Start Date End Date Taking? Authorizing Provider  ALPRAZolam (XANAX) 0.25 MG tablet Take 1 tablet (0.25 mg total) by mouth 2 (two) times daily as needed for anxiety. 05/12/18  Yes Burnard Hawthorne, FNP  metFORMIN (GLUCOPHAGE XR) 500 MG 24 hr tablet Start 500mg  PO qpm. 05/12/18  Yes Arnett, Yvetta Coder, FNP  neomycin-polymyxin b-dexamethasone (MAXITROL) 3.5-10000-0.1 SUSP Place 1 drop into both eyes 2 (two) times daily as needed (FOR EYE IRRITATION/INFECTION).   Yes [provider]  simvastatin (ZOCOR) 20 MG tablet Take 1 tablet (20 mg total) by mouth at bedtime. 05/12/18  Yes Arnett, Yvetta Coder, FNP  Vitamin D, Ergocalciferol, (DRISDOL) 50000 units CAPS capsule Take 1 capsule (50,000 Units total) by mouth every 7 (seven) days. 06/12/18  Yes Georgia Lopes, DO    Family History  Problem Relation  Age of Onset  . Diabetes Mother   . Cancer Mother 50       Brain Tumor - died age 28  . Heart disease Father        CAD - died MI - age 72  . Hypertension Father   . High Cholesterol Father   . Diabetes Sister   . Diabetes Brother   . Diabetes Brother   . Cancer Paternal Aunt        Renal Cell Ca  . Cancer Paternal Uncle        Renal Cell Ca  . Breast cancer Neg Hx      Social History   Tobacco Use  . Smoking status: Former Smoker    Types: Cigarettes    Last attempt to quit: 12/23/2006    Years since quitting: 11.4  . Smokeless tobacco: Never Used  . Tobacco comment: "I WAS A CLOSET SMOKER AND NEVER SMOKED ALOT"  Substance Use  Topics  . Alcohol use: Yes    Comment: Occasional glass of wine  . Drug use: No    Allergies as of 06/15/2018 - Review Complete 06/15/2018  Allergen Reaction Noted  . Simvastatin Other (See Comments) 10/25/2011    Review of Systems:    All systems reviewed and negative except where noted in HPI.   Physical Exam:  BP 131/65   Pulse 70   Ht 5\' 3"  (1.6 m)   Wt 207 lb (93.9 kg)   BMI 36.67 kg/m  No LMP recorded. Patient has had a hysterectomy. General:   Alert,  Well-developed, well-nourished, pleasant and cooperative in NAD Head:  Normocephalic and atraumatic. Eyes:  Sclera clear, no icterus.   Conjunctiva pink. Ears:  Normal auditory acuity. Nose:  No deformity, discharge, or lesions. Mouth:  No deformity or lesions,oropharynx pink & moist. Neck:  Supple; no masses or thyromegaly. Lungs:  Respirations even and unlabored.  Clear throughout to auscultation.   No wheezes, crackles, or rhonchi. No acute distress. Heart:  Regular rate and rhythm; no murmurs, clicks, rubs, or gallops. Abdomen:  Normal bowel sounds.  No bruits.  Soft, non-tender and non-distended without masses, hepatosplenomegaly or hernias noted.  No guarding or rebound tenderness.  Negative Carnett sign.   Rectal:  Deferred.  Msk:  Symmetrical without gross deformities.  Good, equal movement & strength bilaterally. Pulses:  Normal pulses noted. Extremities:  No clubbing or edema.  No cyanosis. Neurologic:  Alert and oriented x3;  grossly normal neurologically. Skin:  Intact without significant lesions or rashes.  No jaundice. Lymph Nodes:  No significant cervical adenopathy. Psych:  Alert and cooperative. Normal mood and affect.  Imaging Studies: Mm Diag Breast Tomo Uni Right  Result Date: 05/30/2018 CLINICAL DATA:  Follow-up for probably benign RIGHT breast calcifications. EXAM: DIGITAL DIAGNOSTIC UNILATERAL RIGHT MAMMOGRAM WITH CAD AND TOMO COMPARISON:  Previous exams including diagnostic mammogram dated  11/21/2017. ACR Breast Density Category b: There are scattered areas of fibroglandular density. FINDINGS: The grouped punctate calcifications within slightly inner RIGHT breast, at posterior depth, are stable. There are no new dominant masses, suspicious calcifications or secondary signs of malignancy within the RIGHT breast on today's exam Mammographic images were processed with CAD. IMPRESSION: Stable probably benign punctate calcifications within the slightly RIGHT breast. Recommend additional follow-up diagnostic mammogram in 6 months to ensure continued stability, which will be performed as a bilateral diagnostic mammogram in conjunction with patient's routine LEFT breast screening mammogram schedule. RECOMMENDATION: Bilateral diagnostic mammogram, with RIGHT breast magnification views, in  6 months. I have discussed the findings and recommendations with the patient. Results were also provided in writing at the conclusion of the visit. If applicable, a reminder letter will be sent to the patient regarding the next appointment. BI-RADS CATEGORY  3: Probably benign. Electronically Signed   By: Franki Cabot M.D.   On: 05/30/2018 11:06   US Abdomen Limited Ruq  Result Date: 05/19/2018 CLINICAL DATA:  Elevated liver function tests. History fatty infiltration of the liver. EXAM: ULTRASOUND ABDOMEN LIMITED RIGHT UPPER QUADRANT COMPARISON:  None. FINDINGS: Gallbladder: Removed. Common bile duct: Diameter: 0.8 cm. Mild dilatation is likely related to cholecystectomy and the patient's age. Liver: The liver border is nodular. Echotexture is coarsened and there is increased echogenicity. No focal lesion is identified. Portal vein is patent on color Doppler imaging with normal direction of blood flow towards the liver. IMPRESSION: Findings consistent with cirrhosis of the liver. Fatty infiltration also noted. No focal lesion. Status post cholecystectomy. Electronically Signed   By: Inge Rise M.D.   On:  05/19/2018 13:55    Assessment and Plan:   Kathy Bryant is a 72 y.o. y/o female who was found incidentally on an ultrasound to have fatty liver disease and cirrhosis.  The patient's labs do not show her to have any sign of decompensation or advanced cirrhosis.  The patient's albumin and platelets are normal.  The patient has been told that losing weight can decrease the amount of fat in her liver.  The patient will also have labs sent off for other possible cause of abnormal liver enzymes.  She has also been told that we will send off blood for alpha-fetoprotein due to her history of cirrhosis.  She will need surveillance for hepatocellular carcinoma every 6 months.  The patient has been explained the plan and agrees with it.  Lucilla Lame, MD. [name]    Note: This dictation was prepared with Dragon dictation along with smaller phrase technology. Any transcriptional errors that result from this process are unintentional.

## 2018-06-16 LAB — HEPATITIS B SURFACE ANTIBODY,QUALITATIVE: HEP B SURFACE AB, QUAL: NONREACTIVE

## 2018-06-16 LAB — AFP TUMOR MARKER: AFP, SERUM, TUMOR MARKER: 2.8 ng/mL (ref 0.0–8.3)

## 2018-06-16 LAB — HEPATITIS B SURFACE ANTIGEN: Hepatitis B Surface Ag: NEGATIVE

## 2018-06-16 LAB — ANTI-SMOOTH MUSCLE ANTIBODY, IGG: Smooth Muscle Ab: 5 Units (ref 0–19)

## 2018-06-16 LAB — HEPATITIS C ANTIBODY

## 2018-06-16 LAB — HEPATIC FUNCTION PANEL
ALK PHOS: 73 IU/L (ref 39–117)
ALT: 67 IU/L — AB (ref 0–32)
AST: 60 IU/L — AB (ref 0–40)
Albumin: 4.4 g/dL (ref 3.5–4.8)
Bilirubin Total: 0.3 mg/dL (ref 0.0–1.2)
Bilirubin, Direct: 0.1 mg/dL (ref 0.00–0.40)
TOTAL PROTEIN: 7 g/dL (ref 6.0–8.5)

## 2018-06-16 LAB — ALPHA-1-ANTITRYPSIN: A1 ANTITRYPSIN: 123 mg/dL (ref 101–187)

## 2018-06-16 LAB — MITOCHONDRIAL ANTIBODIES: Mitochondrial Ab: <20 Units (ref 0.0–20.0)

## 2018-06-16 LAB — ANA: ANA: NEGATIVE

## 2018-06-16 LAB — IRON AND TIBC
IRON: 55 ug/dL (ref 27–139)
Iron Saturation: 18 % (ref 15–55)
TIBC: 311 ug/dL (ref 250–450)
UIBC: 256 ug/dL (ref 118–369)

## 2018-06-16 LAB — FERRITIN: Ferritin: 572 ng/mL — ABNORMAL HIGH (ref 15–150)

## 2018-06-16 LAB — CERULOPLASMIN: Ceruloplasmin: 29.4 mg/dL (ref 19.0–39.0)

## 2018-06-16 LAB — HEPATITIS A ANTIBODY, TOTAL: Hep A Total Ab: POSITIVE — AB

## 2018-06-22 ENCOUNTER — Ambulatory Visit (INDEPENDENT_AMBULATORY_CARE_PROVIDER_SITE_OTHER): Payer: BC Managed Care – PPO | Admitting: Family Medicine

## 2018-06-26 ENCOUNTER — Encounter (INDEPENDENT_AMBULATORY_CARE_PROVIDER_SITE_OTHER): Payer: Self-pay | Admitting: Bariatrics

## 2018-06-26 ENCOUNTER — Ambulatory Visit (INDEPENDENT_AMBULATORY_CARE_PROVIDER_SITE_OTHER): Payer: Medicare Other | Admitting: Bariatrics

## 2018-06-26 VITALS — BP 123/73 | HR 62 | Temp 97.6°F | Ht 63.0 in | Wt 198.0 lb

## 2018-06-26 DIAGNOSIS — K5909 Other constipation: Secondary | ICD-10-CM | POA: Diagnosis not present

## 2018-06-26 DIAGNOSIS — K760 Fatty (change of) liver, not elsewhere classified: Secondary | ICD-10-CM | POA: Diagnosis not present

## 2018-06-26 DIAGNOSIS — E559 Vitamin D deficiency, unspecified: Secondary | ICD-10-CM

## 2018-06-26 DIAGNOSIS — Z6835 Body mass index (BMI) 35.0-35.9, adult: Secondary | ICD-10-CM

## 2018-06-26 DIAGNOSIS — E119 Type 2 diabetes mellitus without complications: Secondary | ICD-10-CM | POA: Diagnosis not present

## 2018-06-26 MED ORDER — VITAMIN D (ERGOCALCIFEROL) 1.25 MG (50000 UNIT) PO CAPS: 50000.0000 [IU] | ORAL_CAPSULE | ORAL | 0 refills | Status: DC

## 2018-06-29 ENCOUNTER — Encounter (INDEPENDENT_AMBULATORY_CARE_PROVIDER_SITE_OTHER): Payer: Self-pay | Admitting: Bariatrics

## 2018-06-29 NOTE — Progress Notes (Signed)
Office: [phone]  /  Fax: [phone]   HPI:   Chief Complaint: OBESITY Kathy Bryant is here to discuss her progress with her obesity treatment plan. She is on the Category 1 plan and is following her eating plan approximately 100 % of the time. She states she is not exercising Kathy Bryant states that it has been tough "too much lean meat". She was hungry at night, but she just went to bed. Kathy Bryant continues to crave sweets. Her weight is 198 lb (89.8 kg) today and has had a weight loss of 7 pounds over a period of 2 weeks since her last visit. She has lost 7 lbs since starting treatment with [place].  Vitamin D deficiency Kathy Bryant has a diagnosis of vitamin D deficiency. She is currently taking high dose prescription vit D. Her last level was at 14.76 and she denies nausea, vomiting or muscle weakness.  Constipation Kathy Bryant notes more constipation with the plan. She has started a stool softener. She denies hematochezia or melena.   Diabetes II Kathy Bryant has a diagnosis of diabetes type II. Kathy Bryant has taken metformin in the past and we discussed metformin at the last visit. Kathy Bryant is currently taking metformin. Last A1c was at 6.7 and last insulin level was at 21.3 She has been working on intensive lifestyle modifications including diet, exercise, and weight loss to help control her blood glucose levels.  NAFLD (non alcoholic fatty liver disease) Kathy Bryant has a diagnosis of NAFLD and her last liver enzymes; AST was at 60 and ALT was at 67. She had an ultrasound 06/12/18.    ALLERGIES: Allergies  Allergen Reactions  . Simvastatin Other (See Comments)    Nightmares    MEDICATIONS: Current Outpatient Medications on File Prior to Visit  Medication Sig Dispense Refill  . ALPRAZolam (XANAX) 0.25 MG tablet Take 1 tablet (0.25 mg total) by mouth 2 (two) times daily as needed for anxiety. 30 tablet 0  . metFORMIN (GLUCOPHAGE XR) 500 MG 24 hr tablet Start 500mg  PO qpm. 90 tablet 3  .  neomycin-polymyxin b-dexamethasone (MAXITROL) 3.5-10000-0.1 SUSP Place 1 drop into both eyes 2 (two) times daily as needed (FOR EYE IRRITATION/INFECTION).    Marland Kitchen simvastatin (ZOCOR) 20 MG tablet Take 1 tablet (20 mg total) by mouth at bedtime. 90 tablet 3   No current facility-administered medications on file prior to visit.     PAST MEDICAL HISTORY: Past Medical History:  Diagnosis Date  . Anxiety   . Bilateral calf pain   . Diabetes mellitus without complication ([place])    PT STATES SHE WAS ON METFORMIN AND STOPPED TAKING THIS ON HER OWN  . Fatty liver   . 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 Herniated disc, cervical   . HTN (hypertension)    PT WAS ON BP MEDS AND THEN STATES HER BP WAS UNDER CONTROL AND SHE STOPPED TAKING BP ON HER OWN  . Microscopic hematuria   . Obesity   . Swelling of both lower extremities     PAST SURGICAL HISTORY: Past Surgical History:  Procedure Laterality Date  . APPENDECTOMY  1968  . BREAST BIOPSY Right 2013   x2 BENIGN BREAST TISSUE WITH FOCAL HYALINIZED STROMA AND BENIGN BREAST TISSUE WITH FOCAL FIBROADENOMATOUS CHANGE   . [place]  . CHOLECYSTECTOMY    . COLONOSCOPY  03/05/2014  . [place]  . HEMORRHOID SURGERY N/A 12/23/2016   Procedure: HEMORRHOIDECTOMY;  Surgeon: Christene Lye, MD;  Location: [facility] ORS;  Service: General;  Laterality: N/A;  . HERNIA REPAIR     Abdominal  . TOTAL ABDOMINAL HYSTERECTOMY W/ BILATERAL SALPINGOOPHORECTOMY  1998    SOCIAL HISTORY: Social History   Tobacco Use  . Smoking status: Former Smoker    Types: Cigarettes    Last attempt to quit: 12/23/2006    Years since quitting: 11.5  . Smokeless tobacco: Never Used  . Tobacco comment: "I WAS A CLOSET SMOKER AND NEVER SMOKED ALOT"  Substance Use Topics  . Alcohol use: Yes    Comment: Occasional glass of wine  . Drug use: No    FAMILY HISTORY: Family History  Problem Relation Age of Onset  . Diabetes Mother   . Cancer Mother  46       Brain Tumor - died age 58  . Heart disease Father        CAD - died MI - age 76  . Hypertension Father   . High Cholesterol Father   . Diabetes Sister   . Diabetes Brother   . Diabetes Brother   . Cancer Paternal Aunt        Renal Cell Ca  . Cancer Paternal Uncle        Renal Cell Ca  . Breast cancer Neg Hx     ROS: Review of Systems  Constitutional: Positive for weight loss.  Gastrointestinal: Positive for constipation. Negative for melena, nausea and vomiting.       Negative for hematochezia  Musculoskeletal:       Negative for muscle weakness  Endo/Heme/Allergies:       Positive for cravings    PHYSICAL EXAM: Blood pressure 123/73, pulse 62, temperature 97.6 F (36.4 C), temperature source Oral, height 5\' 3"  (1.6 m), weight 198 lb (89.8 kg), SpO2 95 %. Body mass index is 35.07 kg/m. Physical Exam  Constitutional: She is oriented to person, place, and time. She appears well-developed and well-nourished.  Cardiovascular: Normal rate.  Pulmonary/Chest: Effort normal.  Musculoskeletal: Normal range of motion.  Neurological: She is oriented to person, place, and time.  Skin: Skin is warm and dry.  Psychiatric: She has a normal mood and affect. Her behavior is normal.  Vitals reviewed.   RECENT LABS AND TESTS: BMET    Component Value Date/Time   NA 140 05/16/2018 0856   K 3.9 05/16/2018 0856   CL 104 05/16/2018 0856   CO2 28 05/16/2018 0856   GLUCOSE 145 (H) 05/16/2018 0856   BUN 16 05/16/2018 0856   CREATININE 0.86 05/16/2018 0856   CALCIUM 9.5 05/16/2018 0856   GFRNONAA >60 12/23/2016 1203   GFRAA >60 12/23/2016 1203   Lab Results  Component Value Date   HGBA1C 6.7 (H) 05/16/2018   HGBA1C 6.3 05/02/2017   HGBA1C 6.3 02/25/2015   HGBA1C 6.4 10/18/2013   Lab Results  Component Value Date   INSULIN 21.3 06/12/2018   CBC    Component Value Date/Time   WBC 7.2 05/16/2018 0856   RBC 4.40 05/16/2018 0856   HGB 14.2 05/16/2018 0856   HCT 42.2  05/16/2018 0856   PLT 185.0 05/16/2018 0856   MCV 95.8 05/16/2018 0856   MCHC 33.7 05/16/2018 0856   RDW 13.7 05/16/2018 0856   LYMPHSABS 2.6 05/16/2018 0856   MONOABS 0.4 05/16/2018 0856   EOSABS 0.1 05/16/2018 0856   BASOSABS 0.0 05/16/2018 0856   Iron/TIBC/Ferritin/ %Sat    Component Value Date/Time   IRON 55 06/15/2018 1535   TIBC 311 06/15/2018 1535   FERRITIN  572 (H) 06/15/2018 1535   IRONPCTSAT 18 06/15/2018 1535   Lipid Panel     Component Value Date/Time   CHOL 193 05/16/2018 0856   TRIG 238.0 (H) 05/16/2018 0856   HDL 46.50 05/16/2018 0856   CHOLHDL 4 05/16/2018 0856   VLDL 47.6 (H) 05/16/2018 0856   LDLCALC 132 (H) 05/02/2017 0949   LDLDIRECT 105.0 05/16/2018 0856   Hepatic Function Panel     Component Value Date/Time   PROT 7.0 06/15/2018 1535   ALBUMIN 4.4 06/15/2018 1535   AST 60 (H) 06/15/2018 1535   ALT 67 (H) 06/15/2018 1535   ALKPHOS 73 06/15/2018 1535   BILITOT 0.3 06/15/2018 1535   BILIDIR 0.10 06/15/2018 1535      Component Value Date/Time   TSH 2.22 05/16/2018 0856   TSH 2.59 05/02/2017 0949   TSH 1.83 11/30/2013 0854   Results for SHERRIL, SHIPMAN (MRN [id number]) as of 06/29/2018 13:38  Ref. Range 05/16/2018 08:56  VITD Latest Ref Range: 30.00 - 100.00 ng/mL 14.76 (L)   ASSESSMENT AND PLAN: Vitamin D deficiency - Plan: Vitamin D, Ergocalciferol, (DRISDOL) 1.25 MG (50000 UT) CAPS capsule  Other constipation  Type 2 diabetes mellitus without complication, without long-term current use of insulin (HCC)  Other fatigue  Class 2 severe obesity with serious comorbidity and body mass index (BMI) of 35.0 to 35.9 in adult, unspecified obesity type ([place])  PLAN:  Vitamin D Deficiency Kathy Bryant was informed that low vitamin D levels contributes to fatigue and are associated with obesity, breast, and colon cancer. She agrees to continue to take prescription Vit D @50 ,000 IU every week #4 with no refills and will follow up for routine testing of  vitamin D, at least 2-3 times per year. She was informed of the risk of over-replacement of vitamin D and agrees to not increase her dose unless she discusses this with [place] first. Kathy Bryant agrees to follow up as directed.  Constipation Kathy Bryant was informed decrease bowel movement frequency is normal while losing weight, but stools should not be hard or painful. She was advised to increase her H20 intake and work on increasing her fiber intake. High fiber foods were discussed today. Kathy Bryant agrees to begin OTC Miralax and follow up with our clinic in 2 weeks.  Diabetes II Kathy Bryant has been given extensive diabetes education by myself today including ideal fasting and post-prandial blood glucose readings, individual ideal Hgb A1c goals and hypoglycemia prevention. We discussed the importance of good blood sugar control to decrease the likelihood of diabetic complications such as nephropathy, neuropathy, limb loss, blindness, coronary artery disease, and death. We discussed the importance of intensive lifestyle modification including diet, exercise and weight loss as the first line treatment for diabetes. Kathy Bryant will continue to work on decreasing simple carbohydrates and increasing lean protein in her diet. Kathy Bryant will continue metformin and will follow up at the agreed upon time.  NAFLD (non alcoholic fatty liver disease) We discussed the diagnosis of non alcoholic fatty liver disease today and how this condition is obesity related. Kathy Bryant was educated on her risk of developing NASH or even liver failure and the only proven treatment for NAFLD was weight loss. Kathy Bryant agreed to continue with her weight loss efforts and a weight loss of at least 5 to 10%. She will begin exercise in the future (resistance and cardio).   Obesity Kathy Bryant is currently in the action stage of change. As such, her goal is to continue with weight loss efforts She has agreed  to follow the Category 1 plan Kathy Bryant has been  instructed to work up to a goal of 150 minutes of combined cardio and strengthening exercise per week for weight loss and overall health benefits. We discussed the following Behavioral Modification Strategies today: keeping healthy foods in the home, no skipping meals, increase H2O intake, increasing lean protein intake, decreasing simple carbohydrates , increasing vegetables, decrease eating out and work on meal planning and easy cooking plans  Handouts for Thanksgiving were given to patient today.  Kathy Bryant has agreed to follow up with our clinic in 2 weeks. She was informed of the importance of frequent follow up visits to maximize her success with intensive lifestyle modifications for her multiple health conditions.   OBESITY BEHAVIORAL INTERVENTION VISIT  Today's visit was # 2   Starting weight: 205 lbs Starting date: 06/12/2018 Today's weight : 198 lbs Today's date: 06/26/2018 Total lbs lost to date: 7 At least 15 minutes were spent on discussing the following behavioral intervention visit.   ASK: We discussed the diagnosis of obesity with Kathy Bryant today and Kathy Bryant agreed to give [place] permission to discuss obesity behavioral modification therapy today.  ASSESS: Kathy Bryant has the diagnosis of obesity and her BMI today is 35.08 Kathy Bryant is in the action stage of change   ADVISE: Kathy Bryant was educated on the multiple health risks of obesity as well as the benefit of weight loss to improve her health. She was advised of the need for long term treatment and the importance of lifestyle modifications to improve her current health and to decrease her risk of future health problems.  AGREE: Multiple dietary modification options and treatment options were discussed and  Justyce agreed to follow the recommendations documented in the above note.  ARRANGE: Katria was educated on the importance of frequent visits to treat obesity as outlined per CMS and USPSTF guidelines and agreed to  schedule her next follow up appointment today.  Corey Skains, am acting as Location manager for [employer]. Owens Shark, DO  I have reviewed the above documentation for accuracy and completeness, and I agree with the above. -Jearld Lesch, DO

## 2018-07-10 ENCOUNTER — Ambulatory Visit (INDEPENDENT_AMBULATORY_CARE_PROVIDER_SITE_OTHER): Payer: Medicare Other | Admitting: Bariatrics

## 2018-07-10 ENCOUNTER — Encounter (INDEPENDENT_AMBULATORY_CARE_PROVIDER_SITE_OTHER): Payer: Self-pay | Admitting: Bariatrics

## 2018-07-10 VITALS — BP 105/63 | HR 64 | Temp 97.7°F | Ht 63.0 in | Wt 196.0 lb

## 2018-07-10 DIAGNOSIS — Z6834 Body mass index (BMI) 34.0-34.9, adult: Secondary | ICD-10-CM

## 2018-07-10 DIAGNOSIS — E559 Vitamin D deficiency, unspecified: Secondary | ICD-10-CM | POA: Diagnosis not present

## 2018-07-10 DIAGNOSIS — E119 Type 2 diabetes mellitus without complications: Secondary | ICD-10-CM | POA: Diagnosis not present

## 2018-07-10 DIAGNOSIS — E669 Obesity, unspecified: Secondary | ICD-10-CM

## 2018-07-10 DIAGNOSIS — K760 Fatty (change of) liver, not elsewhere classified: Secondary | ICD-10-CM

## 2018-07-12 ENCOUNTER — Ambulatory Visit (INDEPENDENT_AMBULATORY_CARE_PROVIDER_SITE_OTHER): Payer: BC Managed Care – PPO | Admitting: Bariatrics

## 2018-07-12 NOTE — Progress Notes (Signed)
Office: [phone]  /  Fax: [phone]   HPI:   Chief Complaint: OBESITY Kathy Bryant is here to discuss her progress with her obesity treatment plan. She is on the Category 1 plan and is following her eating plan approximately 90 % of the time. She states she is exercising 0 minutes 0 times per week. Kathy Bryant is doing well with the plan. She did not gain over the holidays, and her appetite is controlled.  Her weight is 196 lb (88.9 kg) today and has had a weight loss of 2 pounds over a period of 2 weeks since her last visit. She has lost 9 lbs since starting treatment with [place].  Diabetes II Kathy Bryant has a diagnosis of diabetes type II. Evoleth is taking metformin and denies any hypoglycemic episodes. Last A1c was 6.7. She has been working on intensive lifestyle modifications including diet, exercise, and weight loss to help control her blood glucose levels.  Vitamin D Deficiency Kathy Bryant has a diagnosis of vitamin D deficiency. She is currently taking high dose prescription Vit D and denies nausea, vomiting or muscle weakness.  Non-Alcoholic Fatty Liver Disease Kathy Bryant has a diagnosis of NAFLD. She denies abdominal pain or jaundice and has never been told of any liver problems in the past. She denies excessive alcohol intake.  ALLERGIES: Allergies  Allergen Reactions  . Simvastatin Other (See Comments)    Nightmares    MEDICATIONS: Current Outpatient Medications on File Prior to Visit  Medication Sig Dispense Refill  . ALPRAZolam (XANAX) 0.25 MG tablet Take 1 tablet (0.25 mg total) by mouth 2 (two) times daily as needed for anxiety. 30 tablet 0  . metFORMIN (GLUCOPHAGE XR) 500 MG 24 hr tablet Start 500mg  PO qpm. 90 tablet 3  . neomycin-polymyxin b-dexamethasone (MAXITROL) 3.5-10000-0.1 SUSP Place 1 drop into both eyes 2 (two) times daily as needed (FOR EYE IRRITATION/INFECTION).    Marland Kitchen simvastatin (ZOCOR) 20 MG tablet Take 1 tablet (20 mg total) by mouth at bedtime. 90 tablet 3  .  Vitamin D, Ergocalciferol, (DRISDOL) 1.25 MG (50000 UT) CAPS capsule Take 1 capsule (50,000 Units total) by mouth every 7 (seven) days. 4 capsule 0   No current facility-administered medications on file prior to visit.     PAST MEDICAL HISTORY: Past Medical History:  Diagnosis Date  . Anxiety   . Bilateral calf pain   . Diabetes mellitus without complication ([place])    PT STATES SHE WAS ON METFORMIN AND STOPPED TAKING THIS ON HER OWN  . Fatty liver   . 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 Herniated disc, cervical   . HTN (hypertension)    PT WAS ON BP MEDS AND THEN STATES HER BP WAS UNDER CONTROL AND SHE STOPPED TAKING BP ON HER OWN  . Microscopic hematuria   . Obesity   . Swelling of both lower extremities     PAST SURGICAL HISTORY: Past Surgical History:  Procedure Laterality Date  . APPENDECTOMY  1968  . BREAST BIOPSY Right 2013   x2 BENIGN BREAST TISSUE WITH FOCAL HYALINIZED STROMA AND BENIGN BREAST TISSUE WITH FOCAL FIBROADENOMATOUS CHANGE   . [place]  . CHOLECYSTECTOMY    . COLONOSCOPY  03/05/2014  . [place]  . HEMORRHOID SURGERY N/A 12/23/2016   Procedure: HEMORRHOIDECTOMY;  Surgeon: Christene Lye, MD;  Location: [facility] ORS;  Service: General;  Laterality: N/A;  . HERNIA REPAIR     Abdominal  . TOTAL ABDOMINAL HYSTERECTOMY W/ BILATERAL SALPINGOOPHORECTOMY  1998    SOCIAL HISTORY: Social History   Tobacco Use  . Smoking status: Former Smoker    Types: Cigarettes    Last attempt to quit: 12/23/2006    Years since quitting: 11.5  . Smokeless tobacco: Never Used  . Tobacco comment: "I WAS A CLOSET SMOKER AND NEVER SMOKED ALOT"  Substance Use Topics  . Alcohol use: Yes    Comment: Occasional glass of wine  . Drug use: No    FAMILY HISTORY: Family History  Problem Relation Age of Onset  . Diabetes Mother   . Cancer Mother 78       Brain Tumor - died age 62  . Heart disease Father        CAD - died MI - age 80  .  Hypertension Father   . High Cholesterol Father   . Diabetes Sister   . Diabetes Brother   . Diabetes Brother   . Cancer Paternal Aunt        Renal Cell Ca  . Cancer Paternal Uncle        Renal Cell Ca  . Breast cancer Neg Hx     ROS: Review of Systems  Constitutional: Positive for weight loss.  Eyes:       Negative jaundice  Gastrointestinal: Negative for abdominal pain, nausea and vomiting.  Musculoskeletal:       Negative muscle weakness  Endo/Heme/Allergies:       Negative hypoglycemia    PHYSICAL EXAM: Blood pressure 105/63, pulse 64, temperature 97.7 F (36.5 C), temperature source Oral, height 5\' 3"  (1.6 m), weight 196 lb (88.9 kg), SpO2 95 %. Body mass index is 34.72 kg/m. Physical Exam  Constitutional: She is oriented to person, place, and time. She appears well-developed and well-nourished.  Cardiovascular: Normal rate.  Pulmonary/Chest: Effort normal.  Musculoskeletal: Normal range of motion.  Neurological: She is oriented to person, place, and time.  Skin: Skin is warm and dry.  Psychiatric: She has a normal mood and affect. Her behavior is normal.  Vitals reviewed.   RECENT LABS AND TESTS: BMET    Component Value Date/Time   NA 140 05/16/2018 0856   K 3.9 05/16/2018 0856   CL 104 05/16/2018 0856   CO2 28 05/16/2018 0856   GLUCOSE 145 (H) 05/16/2018 0856   BUN 16 05/16/2018 0856   CREATININE 0.86 05/16/2018 0856   CALCIUM 9.5 05/16/2018 0856   GFRNONAA >60 12/23/2016 1203   GFRAA >60 12/23/2016 1203   Lab Results  Component Value Date   HGBA1C 6.7 (H) 05/16/2018   HGBA1C 6.3 05/02/2017   HGBA1C 6.3 02/25/2015   HGBA1C 6.4 10/18/2013   Lab Results  Component Value Date   INSULIN 21.3 06/12/2018   CBC    Component Value Date/Time   WBC 7.2 05/16/2018 0856   RBC 4.40 05/16/2018 0856   HGB 14.2 05/16/2018 0856   HCT 42.2 05/16/2018 0856   PLT 185.0 05/16/2018 0856   MCV 95.8 05/16/2018 0856   MCHC 33.7 05/16/2018 0856   RDW 13.7  05/16/2018 0856   LYMPHSABS 2.6 05/16/2018 0856   MONOABS 0.4 05/16/2018 0856   EOSABS 0.1 05/16/2018 0856   BASOSABS 0.0 05/16/2018 0856   Iron/TIBC/Ferritin/ %Sat    Component Value Date/Time   IRON 55 06/15/2018 1535   TIBC 311 06/15/2018 1535   FERRITIN 572 (H) 06/15/2018 1535   IRONPCTSAT 18 06/15/2018 1535   Lipid Panel     Component Value Date/Time   CHOL 193 05/16/2018  0856   TRIG 238.0 (H) 05/16/2018 0856   HDL 46.50 05/16/2018 0856   CHOLHDL 4 05/16/2018 0856   VLDL 47.6 (H) 05/16/2018 0856   LDLCALC 132 (H) 05/02/2017 0949   LDLDIRECT 105.0 05/16/2018 0856   Hepatic Function Panel     Component Value Date/Time   PROT 7.0 06/15/2018 1535   ALBUMIN 4.4 06/15/2018 1535   AST 60 (H) 06/15/2018 1535   ALT 67 (H) 06/15/2018 1535   ALKPHOS 73 06/15/2018 1535   BILITOT 0.3 06/15/2018 1535   BILIDIR 0.10 06/15/2018 1535      Component Value Date/Time   TSH 2.22 05/16/2018 0856   TSH 2.59 05/02/2017 0949   TSH 1.83 11/30/2013 0854  Results for SOFIE, SCHENDEL (MRN [id number]) as of 07/12/2018 16:48  Ref. Range 05/16/2018 08:56  VITD Latest Ref Range: 30.00 - 100.00 ng/mL 14.76 (L)    ASSESSMENT AND PLAN: Type 2 diabetes mellitus without complication, without long-term current use of insulin (HCC)  Vitamin D deficiency  NAFLD (nonalcoholic fatty liver disease)  Class 1 obesity with serious comorbidity and body mass index (BMI) of 34.0 to 34.9 in adult, unspecified obesity type  PLAN:  Diabetes II Dalena has been given extensive diabetes education by myself today including ideal fasting and post-prandial blood glucose readings, individual ideal Hgb A1c goals and hypoglycemia prevention. We discussed the importance of good blood sugar control to decrease the likelihood of diabetic complications such as nephropathy, neuropathy, limb loss, blindness, coronary artery disease, and death. We discussed the importance of intensive lifestyle modification including  diet, exercise and weight loss as the first line treatment for diabetes. Verle agrees to continue her diabetes medications and she agrees to follow up with our clinic in 2 weeks.  Vitamin D Deficiency Lounette was informed that low vitamin D levels contributes to fatigue and are associated with obesity, breast, and colon cancer. Tianah agrees to continue taking prescription Vit D @50 ,000 IU every week and will follow up for routine testing of vitamin D, at least 2-3 times per year. She was informed of the risk of over-replacement of vitamin D and agrees to not increase her dose unless she discusses this with [place] first. Yazmen agrees to follow up with our clinic in 2 weeks.  Non-Alcoholic Fatty Liver Disease We discussed the likely diagnosis of non alcoholic fatty liver disease today and how this condition is obesity related. Reeanna was educated on her risk of developing NASH or even liver failure and th only proven treatment for NAFLD was weight loss. Assunta agreed to continue with her weight loss efforts with healthier diet and exercise as an essential part of her treatment plan. She is to decrease her weight 5-10 %, work on exercise (resistance, cardio, and build up to 150 minutes weekly). Shar agrees to follow up with our clinic in 2 weeks.  Obesity Nakita is currently in the action stage of change. As such, her goal is to continue with weight loss efforts She has agreed to follow the Category 1 plan Alton has been instructed to work up to a goal of 150 minutes of combined cardio and strengthening exercise per week for weight loss and overall health benefits. We discussed the following Behavioral Modification Strategies today: increasing lean protein intake, decreasing simple carbohydrates, increasing vegetables, increase H20 intake, travel eating strategies, and celebration eating strategies   Eddith has agreed to follow up with our clinic in 2 weeks. She was informed of the importance  of frequent follow up visits  to maximize her success with intensive lifestyle modifications for her multiple health conditions.   OBESITY BEHAVIORAL INTERVENTION VISIT  Today's visit was # 3   Starting weight: 205 lbs Starting date: 06/12/18 Today's weight : 196 lbs Today's date: 07/10/2018 Total lbs lost to date: 9 At least 15 minutes were spent on discussing the following behavioral intervention visit.   ASK: We discussed the diagnosis of obesity with Anh R Delvecchio today and Zairah agreed to give [place] permission to discuss obesity behavioral modification therapy today.  ASSESS: Danelly has the diagnosis of obesity and her BMI today is 34.73 Erminia is in the action stage of change   ADVISE: Tyeesha was educated on the multiple health risks of obesity as well as the benefit of weight loss to improve her health. She was advised of the need for long term treatment and the importance of lifestyle modifications to improve her current health and to decrease her risk of future health problems.  AGREE: Multiple dietary modification options and treatment options were discussed and  Quatisha agreed to follow the recommendations documented in the above note.  ARRANGE: Jariah was educated on the importance of frequent visits to treat obesity as outlined per CMS and USPSTF guidelines and agreed to schedule her next follow up appointment today.  Wilhemena Durie, am acting as transcriptionist for [employer], DO  I have reviewed the above documentation for accuracy and completeness, and I agree with the above. -Jearld Lesch, DO

## 2018-07-17 ENCOUNTER — Encounter (INDEPENDENT_AMBULATORY_CARE_PROVIDER_SITE_OTHER): Payer: Self-pay | Admitting: Bariatrics

## 2018-07-19 ENCOUNTER — Encounter (INDEPENDENT_AMBULATORY_CARE_PROVIDER_SITE_OTHER): Payer: Self-pay | Admitting: Bariatrics

## 2018-07-24 ENCOUNTER — Encounter (INDEPENDENT_AMBULATORY_CARE_PROVIDER_SITE_OTHER): Payer: Self-pay | Admitting: Bariatrics

## 2018-07-24 ENCOUNTER — Ambulatory Visit (INDEPENDENT_AMBULATORY_CARE_PROVIDER_SITE_OTHER): Payer: Medicare Other | Admitting: Bariatrics

## 2018-07-24 VITALS — BP 116/71 | HR 62 | Temp 97.5°F | Ht 63.0 in | Wt 194.0 lb

## 2018-07-24 DIAGNOSIS — E559 Vitamin D deficiency, unspecified: Secondary | ICD-10-CM | POA: Diagnosis not present

## 2018-07-24 DIAGNOSIS — E669 Obesity, unspecified: Secondary | ICD-10-CM

## 2018-07-24 DIAGNOSIS — Z6834 Body mass index (BMI) 34.0-34.9, adult: Secondary | ICD-10-CM | POA: Diagnosis not present

## 2018-07-24 DIAGNOSIS — E119 Type 2 diabetes mellitus without complications: Secondary | ICD-10-CM | POA: Diagnosis not present

## 2018-07-24 DIAGNOSIS — E66811 Obesity, class 1: Secondary | ICD-10-CM

## 2018-07-24 NOTE — Progress Notes (Signed)
Office: [phone]  /  Fax: [phone]   HPI:   Chief Complaint: Kathy Kathy is here to discuss her progress with her Kathy treatment plan. She is on the Category 1 plan and is following her eating plan approximately 100 % of the time. She states she is exercising 0 minutes 0 times per week. Kathy Kathy is doing well. She is still hungry occasionally. Her weight is 194 lb (88 kg) today and has had a weight loss of 4 pounds over a period of 2 weeks since her last visit. She has lost 13 lbs since starting treatment with [place].  Diabetes II Kathy Kathy has a diagnosis of diabetes type II. Kathy Kathy is currently taking metformin and she denies any hypoglycemic episodes. Last A1c was at 6.7 She has been working on intensive lifestyle modifications including diet, exercise, and weight loss to help control her blood glucose levels.  Vitamin D deficiency Kathy Kathy has a diagnosis of vitamin D deficiency. She is currently taking vit D and denies nausea, vomiting or muscle weakness.  ASSESSMENT AND PLAN:  Type 2 diabetes mellitus without complication, without long-term current use of insulin (HCC)  Vitamin D deficiency  Class 1 Kathy with serious comorbidity and body mass index (BMI) of 34.0 to 34.9 in adult, unspecified Kathy type  PLAN:  Diabetes II Kathy Kathy has been given extensive diabetes education by myself today including ideal fasting and post-prandial blood glucose readings, individual ideal Hgb A1c goals and hypoglycemia prevention. We discussed the importance of good blood sugar control to decrease the likelihood of diabetic complications such as nephropathy, neuropathy, limb loss, blindness, coronary artery disease, and death. We discussed the importance of intensive lifestyle modification including diet, exercise and weight loss as the first line treatment for diabetes. Kathy Kathy agrees to continue metformin and will follow up at the agreed upon time.  Vitamin D Deficiency Kathy Kathy  was informed that low vitamin D levels contributes to fatigue and are associated with Kathy, breast, and colon cancer. She agrees to continue to take prescription Vit D @50 ,000 IU every week and will follow up for routine testing of vitamin D, at least 2-3 times per year. She was informed of the risk of over-replacement of vitamin D and agrees to not increase her dose unless she discusses this with [place] first.  Kathy Kathy is currently in the action stage of Kathy. As such, her goal is to continue with weight loss efforts She has agreed to follow the Category 1 plan Kathy Kathy has been instructed to work up to a goal of 150 minutes of combined cardio and strengthening exercise per week for weight loss and overall health benefits. We discussed the following Behavioral Modification Strategies today: increase H2O intake, increasing lean protein intake, decreasing simple carbohydrates , increasing vegetables and work on meal planning and easy cooking plans "Fruit" handout was provided to patient today. She will save 100 calories for an evening snack.  Kathy Kathy has agreed to follow up with our clinic in 2 weeks. She was informed of the importance of frequent follow up visits to maximize her success with intensive lifestyle modifications for her multiple health conditions.  ALLERGIES: Allergies  Allergen Reactions  . Simvastatin Other (See Comments)    Nightmares    MEDICATIONS: Current Outpatient Medications on File Prior to Visit  Medication Sig Dispense Refill  . ALPRAZolam (XANAX) 0.25 MG tablet Take 1 tablet (0.25 mg total) by mouth 2 (two) times daily as needed for anxiety. 30 tablet 0  . metFORMIN (GLUCOPHAGE XR) 500  MG 24 hr tablet Start 500mg  PO qpm. 90 tablet 3  . neomycin-polymyxin b-dexamethasone (MAXITROL) 3.5-10000-0.1 SUSP Place 1 drop into both eyes 2 (two) times daily as needed (FOR EYE IRRITATION/INFECTION).    Marland Kitchen simvastatin (ZOCOR) 20 MG tablet Take 1 tablet (20 mg total) by  mouth at bedtime. 90 tablet 3  . Vitamin D, Ergocalciferol, (DRISDOL) 1.25 MG (50000 UT) CAPS capsule Take 1 capsule (50,000 Units total) by mouth every 7 (seven) days. 4 capsule 0   No current facility-administered medications on file prior to visit.     PAST MEDICAL HISTORY: Past Medical History:  Diagnosis Date  . Anxiety   . Bilateral calf pain   . Diabetes mellitus without complication ([place])    PT STATES SHE WAS ON METFORMIN AND STOPPED TAKING THIS ON HER OWN  . Fatty liver   . 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 Herniated disc, cervical   . HTN (hypertension)    PT WAS ON BP MEDS AND THEN STATES HER BP WAS UNDER CONTROL AND SHE STOPPED TAKING BP ON HER OWN  . Microscopic hematuria   . Kathy   . Swelling of both lower extremities     PAST SURGICAL HISTORY: Past Surgical History:  Procedure Laterality Date  . APPENDECTOMY  1968  . BREAST BIOPSY Right 2013   x2 BENIGN BREAST TISSUE WITH FOCAL HYALINIZED STROMA AND BENIGN BREAST TISSUE WITH FOCAL FIBROADENOMATOUS Kathy   . [place]  . CHOLECYSTECTOMY    . COLONOSCOPY  03/05/2014  . [place]  . HEMORRHOID SURGERY N/A 12/23/2016   Procedure: HEMORRHOIDECTOMY;  Surgeon: Christene Lye, MD;  Location: [facility] ORS;  Service: General;  Laterality: N/A;  . HERNIA REPAIR     Abdominal  . TOTAL ABDOMINAL HYSTERECTOMY W/ BILATERAL SALPINGOOPHORECTOMY  1998    SOCIAL HISTORY: Social History   Tobacco Use  . Smoking status: Former Smoker    Types: Cigarettes    Last attempt to quit: 12/23/2006    Years since quitting: 11.5  . Smokeless tobacco: Never Used  . Tobacco comment: "I WAS A CLOSET SMOKER AND NEVER SMOKED ALOT"  Substance Use Topics  . Alcohol use: Yes    Comment: Occasional glass of wine  . Drug use: No    FAMILY HISTORY: Family History  Problem Relation Age of Onset  . Diabetes Mother   . Cancer Mother 55       Brain Tumor - died age 47  . Heart disease Father        CAD  - died MI - age 12  . Hypertension Father   . High Cholesterol Father   . Diabetes Sister   . Diabetes Brother   . Diabetes Brother   . Cancer Paternal Aunt        Renal Cell Ca  . Cancer Paternal Uncle        Renal Cell Ca  . Breast cancer Neg Hx     ROS: Review of Systems  Constitutional: Positive for weight loss.  Gastrointestinal: Negative for nausea and vomiting.  Musculoskeletal:       Negative for muscle weakness  Endo/Heme/Allergies:       Positive for polyphagia Negative for hypoglycemia    PHYSICAL EXAM: Blood pressure 116/71, pulse 62, temperature (!) 97.5 F (36.4 C), temperature source Oral, height 5\' 3"  (1.6 m), weight 194 lb (88 kg), SpO2 93 %. Body mass index is 34.37 kg/m. Physical Exam Vitals signs reviewed.  Constitutional:      Appearance: Normal appearance. She is well-developed. She is obese.  Cardiovascular:     Rate and Rhythm: Normal rate.  Pulmonary:     Effort: Pulmonary effort is normal.  Musculoskeletal: Normal range of motion.  Skin:    General: Skin is warm and dry.  Neurological:     Mental Status: She is alert and oriented to person, place, and time.  Psychiatric:        Mood and Affect: Mood normal.        Behavior: Behavior normal.     RECENT LABS AND TESTS: BMET    Component Value Date/Time   NA 140 05/16/2018 0856   K 3.9 05/16/2018 0856   CL 104 05/16/2018 0856   CO2 28 05/16/2018 0856   GLUCOSE 145 (H) 05/16/2018 0856   BUN 16 05/16/2018 0856   CREATININE 0.86 05/16/2018 0856   CALCIUM 9.5 05/16/2018 0856   GFRNONAA >60 12/23/2016 1203   GFRAA >60 12/23/2016 1203   Lab Results  Component Value Date   HGBA1C 6.7 (H) 05/16/2018   HGBA1C 6.3 05/02/2017   HGBA1C 6.3 02/25/2015   HGBA1C 6.4 10/18/2013   Lab Results  Component Value Date   INSULIN 21.3 06/12/2018   CBC    Component Value Date/Time   WBC 7.2 05/16/2018 0856   RBC 4.40 05/16/2018 0856   HGB 14.2 05/16/2018 0856   HCT 42.2 05/16/2018 0856    PLT 185.0 05/16/2018 0856   MCV 95.8 05/16/2018 0856   MCHC 33.7 05/16/2018 0856   RDW 13.7 05/16/2018 0856   LYMPHSABS 2.6 05/16/2018 0856   MONOABS 0.4 05/16/2018 0856   EOSABS 0.1 05/16/2018 0856   BASOSABS 0.0 05/16/2018 0856   Iron/TIBC/Ferritin/ %Sat    Component Value Date/Time   IRON 55 06/15/2018 1535   TIBC 311 06/15/2018 1535   FERRITIN 572 (H) 06/15/2018 1535   IRONPCTSAT 18 06/15/2018 1535   Lipid Panel     Component Value Date/Time   CHOL 193 05/16/2018 0856   TRIG 238.0 (H) 05/16/2018 0856   HDL 46.50 05/16/2018 0856   CHOLHDL 4 05/16/2018 0856   VLDL 47.6 (H) 05/16/2018 0856   LDLCALC 132 (H) 05/02/2017 0949   LDLDIRECT 105.0 05/16/2018 0856   Hepatic Function Panel     Component Value Date/Time   PROT 7.0 06/15/2018 1535   ALBUMIN 4.4 06/15/2018 1535   AST 60 (H) 06/15/2018 1535   ALT 67 (H) 06/15/2018 1535   ALKPHOS 73 06/15/2018 1535   BILITOT 0.3 06/15/2018 1535   BILIDIR 0.10 06/15/2018 1535      Component Value Date/Time   TSH 2.22 05/16/2018 0856   TSH 2.59 05/02/2017 0949   TSH 1.83 11/30/2013 0854    Ref. Range 11/30/2013 08:54  Vitamin D, 25-Hydroxy Latest Ref Range: 30 - 89 ng/mL 13 (L)     Kathy BEHAVIORAL INTERVENTION VISIT  Today's visit was # 4   Starting weight: 205 lbs Starting date: 06/12/2018 Today's weight : 192 lbs Today's date: 07/24/2018 Total lbs lost to date: 13 At least 15 minutes were spent on discussing the following behavioral intervention visit.   ASK: We discussed the diagnosis of Kathy with Kathy Kathy today and Kathy Kathy agreed to give [place] permission to discuss Kathy behavioral modification therapy today.  ASSESS: Kathy Kathy has the diagnosis of Kathy and her BMI today is 34.37 Kathy Kathy   ADVISE: Kathy Kathy was educated on the multiple health risks  of Kathy as well as the benefit of weight loss to improve her health. She was advised of the need for long term  treatment and the importance of lifestyle modifications to improve her current health and to decrease her risk of future health problems.  AGREE: Multiple dietary modification options and treatment options were discussed and  Kathy Kathy agreed to follow the recommendations documented in the above note.  ARRANGE: Kathy Kathy was educated on the importance of frequent visits to treat Kathy as outlined per CMS and USPSTF guidelines and agreed to schedule her next follow up appointment today.  Kathy Kathy, am acting as Location manager for [employer]. Kathy Shark, DO  I have reviewed the above documentation for accuracy and completeness, and I agree with the above. -Jearld Lesch, DO

## 2018-07-25 ENCOUNTER — Ambulatory Visit: Payer: Medicare Other | Admitting: Gastroenterology

## 2018-07-26 ENCOUNTER — Ambulatory Visit (INDEPENDENT_AMBULATORY_CARE_PROVIDER_SITE_OTHER): Payer: BC Managed Care – PPO | Admitting: Bariatrics

## 2018-08-15 ENCOUNTER — Encounter (INDEPENDENT_AMBULATORY_CARE_PROVIDER_SITE_OTHER): Payer: Self-pay | Admitting: Family Medicine

## 2018-08-15 ENCOUNTER — Ambulatory Visit (INDEPENDENT_AMBULATORY_CARE_PROVIDER_SITE_OTHER): Payer: Medicare Other | Admitting: Family Medicine

## 2018-08-15 VITALS — BP 103/68 | HR 71 | Temp 97.8°F | Ht 63.0 in | Wt 193.0 lb

## 2018-08-15 DIAGNOSIS — Z6834 Body mass index (BMI) 34.0-34.9, adult: Secondary | ICD-10-CM

## 2018-08-15 DIAGNOSIS — E119 Type 2 diabetes mellitus without complications: Secondary | ICD-10-CM | POA: Diagnosis not present

## 2018-08-15 DIAGNOSIS — K7460 Unspecified cirrhosis of liver: Secondary | ICD-10-CM

## 2018-08-15 DIAGNOSIS — E669 Obesity, unspecified: Secondary | ICD-10-CM | POA: Diagnosis not present

## 2018-08-15 DIAGNOSIS — E559 Vitamin D deficiency, unspecified: Secondary | ICD-10-CM

## 2018-08-15 DIAGNOSIS — E66811 Obesity, class 1: Secondary | ICD-10-CM

## 2018-08-15 MED ORDER — VITAMIN D (ERGOCALCIFEROL) 1.25 MG (50000 UNIT) PO CAPS: 50000.0000 [IU] | ORAL_CAPSULE | ORAL | 0 refills | Status: DC

## 2018-08-15 NOTE — Progress Notes (Signed)
Office: [phone]  /  Fax: [phone]   HPI:   Chief Complaint: OBESITY Kathy Bryant is here to discuss her progress with her obesity treatment plan. She is on the Category 1 plan and is following her eating plan approximately 50 % of the time. She states she is exercising 0 minutes 0 times per week. Kathy Bryant is surprised that she lost weight because she has not been adhering well to the plan. She is back on the plan, but she does not always eat all of the food on the plan.  Her weight is 193 lb (87.5 kg) today and has had a weight loss of 1 pound over a period of 3 weeks since her last visit. She has lost 12 lbs since starting treatment with [place].  Vitamin D deficiency Kathy Bryant has a diagnosis of vitamin D deficiency. She is currently taking vit D and is not at goal. Her last vitamin D level was 14.7 on 05/16/18. She denies nausea, vomiting, or muscle weakness.  Diabetes II Kathy Bryant has a diagnosis of diabetes type II. Kathy Bryant states that she does not check sugars. Last A1c was 6.7 on 110/8/19. She is well controlled on metformin, but she does have cravings for sweets. She has been working on intensive lifestyle modifications including diet, exercise, and weight loss to help control her blood glucose levels. She denies polyphagia or any hypoglycemic episodes.  Liver Cirrhosis Kathy Bryant and I discussed that weight loss of at least 10% will improve cirrhosis. 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  ASSESSMENT AND PLAN:  Vitamin D deficiency - Plan: Vitamin D, Ergocalciferol, (DRISDOL) 1.25 MG (50000 UT) CAPS capsule  Type 2 diabetes mellitus without complication, without long-term current use of insulin (HCC)  Hepatic cirrhosis, unspecified hepatic cirrhosis type, unspecified whether ascites present (HCC)  Class 1 obesity with serious comorbidity and body mass index (BMI) of 34.0 to 34.9 in adult, unspecified obesity type  PLAN:  Vitamin D Deficiency Kathy Bryant was informed  that low vitamin D levels contributes to fatigue and are associated with obesity, breast, and colon cancer. She agrees to continue to take prescription Vit D @50 ,000 IU every week #4 with no refills and will follow up for routine testing of vitamin D, at least 2-3 times per year. She was informed of the risk of over-replacement of vitamin D and agrees to not increase her dose unless she discusses this with [place] first. We will check her vitamin D level at her next visit in 2 weeks. Kathy Bryant agrees with this plan.  Diabetes II Kathy Bryant has been given extensive diabetes education by myself today including ideal fasting and post-prandial blood glucose readings, individual ideal Hgb A1c goals, and hypoglycemia prevention. We discussed the importance of good blood sugar control to decrease the likelihood of diabetic complications such as nephropathy, neuropathy, limb loss, blindness, coronary artery disease, and death. We discussed the importance of intensive lifestyle modification including diet, exercise and weight loss as the first line treatment for diabetes. Kathy Bryant agrees to continue her metformin and meal plan. She will follow up at the agreed upon time.  Liver Cirrhosis Kathy Bryant agrees to continue her meal plan and will follow up in 2 weeks.  Obesity Kathy Bryant is currently in the action stage of change. As such, her goal is to continue with weight loss efforts. We discussed her goal wight of 165 pounds and making sure that she eats all the food on her plan. She has agreed to follow the Category 1 plan. Kathy Bryant has  been instructed to walk for 30 minutes 3 times per week. We discussed the following Behavioral Modification Strategies today: increasing lean protein intake, work on meal planning and easy cooking plans, and planning for success.  Kathy Bryant has agreed to follow up with our clinic in 2 weeks. She was informed of the importance of frequent follow up visits to maximize her success with intensive  lifestyle modifications for her multiple health conditions.  ALLERGIES: Allergies  Allergen Reactions  . Simvastatin Other (See Comments)    Nightmares    MEDICATIONS: Current Outpatient Medications on File Prior to Visit  Medication Sig Dispense Refill  . ALPRAZolam (XANAX) 0.25 MG tablet Take 1 tablet (0.25 mg total) by mouth 2 (two) times daily as needed for anxiety. 30 tablet 0  . metFORMIN (GLUCOPHAGE XR) 500 MG 24 hr tablet Start 500mg  PO qpm. 90 tablet 3  . neomycin-polymyxin b-dexamethasone (MAXITROL) 3.5-10000-0.1 SUSP Place 1 drop into both eyes 2 (two) times daily as needed (FOR EYE IRRITATION/INFECTION).    Marland Kitchen simvastatin (ZOCOR) 20 MG tablet Take 1 tablet (20 mg total) by mouth at bedtime. 90 tablet 3   No current facility-administered medications on file prior to visit.     PAST MEDICAL HISTORY: Past Medical History:  Diagnosis Date  . Anxiety   . Bilateral calf pain   . Diabetes mellitus without complication ([place])    PT STATES SHE WAS ON METFORMIN AND STOPPED TAKING THIS ON HER OWN  . Fatty liver   . 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 Herniated disc, cervical   . HTN (hypertension)    PT WAS ON BP MEDS AND THEN STATES HER BP WAS UNDER CONTROL AND SHE STOPPED TAKING BP ON HER OWN  . Microscopic hematuria   . Obesity   . Swelling of both lower extremities     PAST SURGICAL HISTORY: Past Surgical History:  Procedure Laterality Date  . APPENDECTOMY  1968  . BREAST BIOPSY Right 2013   x2 BENIGN BREAST TISSUE WITH FOCAL HYALINIZED STROMA AND BENIGN BREAST TISSUE WITH FOCAL FIBROADENOMATOUS CHANGE   . [place]  . CHOLECYSTECTOMY    . COLONOSCOPY  03/05/2014  . [place]  . HEMORRHOID SURGERY N/A 12/23/2016   Procedure: HEMORRHOIDECTOMY;  Surgeon: Christene Lye, MD;  Location: [facility] ORS;  Service: General;  Laterality: N/A;  . HERNIA REPAIR     Abdominal  . TOTAL ABDOMINAL HYSTERECTOMY W/ BILATERAL SALPINGOOPHORECTOMY  1998     SOCIAL HISTORY: Social History   Tobacco Use  . Smoking status: Former Smoker    Types: Cigarettes    Last attempt to quit: 12/23/2006    Years since quitting: 11.6  . Smokeless tobacco: Never Used  . Tobacco comment: "I WAS A CLOSET SMOKER AND NEVER SMOKED ALOT"  Substance Use Topics  . Alcohol use: Yes    Comment: Occasional glass of wine  . Drug use: No    FAMILY HISTORY: Family History  Problem Relation Age of Onset  . Diabetes Mother   . Cancer Mother 8       Brain Tumor - died age 40  . Heart disease Father        CAD - died MI - age 17  . Hypertension Father   . High Cholesterol Father   . Diabetes Sister   . Diabetes Brother   . Diabetes Brother   . Cancer Paternal Aunt        Renal Cell Ca  .  Cancer Paternal Uncle        Renal Cell Ca  . Breast cancer Neg Hx     ROS: Review of Systems  Constitutional: Positive for weight loss.  Gastrointestinal: Negative for nausea and vomiting.  Musculoskeletal:       Negative for muscle weakness.  Endo/Heme/Allergies:       Negative for polyphagia. Negative for hypoglycemia.    PHYSICAL EXAM: Blood pressure 103/68, pulse 71, temperature 97.8 F (36.6 C), temperature source Oral, height 5\' 3"  (1.6 m), weight 193 lb (87.5 kg), SpO2 96 %. Body mass index is 34.19 kg/m. Physical Exam Vitals signs reviewed.  Constitutional:      Appearance: Normal appearance. She is obese.  Cardiovascular:     Rate and Rhythm: Normal rate.  Pulmonary:     Effort: Pulmonary effort is normal.  Musculoskeletal: Normal range of motion.  Skin:    General: Skin is warm and dry.  Neurological:     Mental Status: She is alert and oriented to person, place, and time.  Psychiatric:        Mood and Affect: Mood normal.        Behavior: Behavior normal.     RECENT LABS AND TESTS: BMET    Component Value Date/Time   NA 140 05/16/2018 0856   K 3.9 05/16/2018 0856   CL 104 05/16/2018 0856   CO2 28 05/16/2018 0856   GLUCOSE  145 (H) 05/16/2018 0856   BUN 16 05/16/2018 0856   CREATININE 0.86 05/16/2018 0856   CALCIUM 9.5 05/16/2018 0856   GFRNONAA >60 12/23/2016 1203   GFRAA >60 12/23/2016 1203   Lab Results  Component Value Date   HGBA1C 6.7 (H) 05/16/2018   HGBA1C 6.3 05/02/2017   HGBA1C 6.3 02/25/2015   HGBA1C 6.4 10/18/2013   Lab Results  Component Value Date   INSULIN 21.3 06/12/2018   CBC    Component Value Date/Time   WBC 7.2 05/16/2018 0856   RBC 4.40 05/16/2018 0856   HGB 14.2 05/16/2018 0856   HCT 42.2 05/16/2018 0856   PLT 185.0 05/16/2018 0856   MCV 95.8 05/16/2018 0856   MCHC 33.7 05/16/2018 0856   RDW 13.7 05/16/2018 0856   LYMPHSABS 2.6 05/16/2018 0856   MONOABS 0.4 05/16/2018 0856   EOSABS 0.1 05/16/2018 0856   BASOSABS 0.0 05/16/2018 0856   Iron/TIBC/Ferritin/ %Sat    Component Value Date/Time   IRON 55 06/15/2018 1535   TIBC 311 06/15/2018 1535   FERRITIN 572 (H) 06/15/2018 1535   IRONPCTSAT 18 06/15/2018 1535   Lipid Panel     Component Value Date/Time   CHOL 193 05/16/2018 0856   TRIG 238.0 (H) 05/16/2018 0856   HDL 46.50 05/16/2018 0856   CHOLHDL 4 05/16/2018 0856   VLDL 47.6 (H) 05/16/2018 0856   LDLCALC 132 (H) 05/02/2017 0949   LDLDIRECT 105.0 05/16/2018 0856   Hepatic Function Panel     Component Value Date/Time   PROT 7.0 06/15/2018 1535   ALBUMIN 4.4 06/15/2018 1535   AST 60 (H) 06/15/2018 1535   ALT 67 (H) 06/15/2018 1535   ALKPHOS 73 06/15/2018 1535   BILITOT 0.3 06/15/2018 1535   BILIDIR 0.10 06/15/2018 1535      Component Value Date/Time   TSH 2.22 05/16/2018 0856   TSH 2.59 05/02/2017 0949   TSH 1.83 11/30/2013 0854   OBESITY BEHAVIORAL INTERVENTION VISIT  Today's visit was # 5   Starting weight: 205 lbs Starting date: 06/12/18 Today's weight : Weight:  193 lb (87.5 kg)  Today's date: 08/15/2018 Total lbs lost to date: 12 At least 15 minutes were spent on discussing the following behavioral intervention visit.  ASK: We discussed  the diagnosis of obesity with Chelcy R Neuner today and Hayde agreed to give [place] permission to discuss obesity behavioral modification therapy today.  ASSESS: Marguerita has the diagnosis of obesity and her BMI today is 34.2. Rosilyn is in the action stage of change.   ADVISE: Varie was educated on the multiple health risks of obesity as well as the benefit of weight loss to improve her health. She was advised of the need for long term treatment and the importance of lifestyle modifications to improve her current health and to decrease her risk of future health problems.  AGREE: Multiple dietary modification options and treatment options were discussed and Maleah agreed to follow the recommendations documented in the above note.  ARRANGE: Ieesha was educated on the importance of frequent visits to treat obesity as outlined per CMS and USPSTF guidelines and agreed to schedule her next follow up appointment today.  I, Marcille Blanco, am acting as Location manager for [employer], FNP-C.  I have reviewed the above documentation for accuracy and completeness, and I agree with the above.  -  , FNP-C.

## 2018-08-16 ENCOUNTER — Encounter (INDEPENDENT_AMBULATORY_CARE_PROVIDER_SITE_OTHER): Payer: Self-pay | Admitting: Family Medicine

## 2018-08-18 ENCOUNTER — Ambulatory Visit (INDEPENDENT_AMBULATORY_CARE_PROVIDER_SITE_OTHER): Payer: Medicare Other | Admitting: Family

## 2018-08-18 ENCOUNTER — Encounter: Payer: Self-pay | Admitting: Family

## 2018-08-18 VITALS — BP 118/64 | HR 76 | Temp 97.9°F | Wt 197.6 lb

## 2018-08-18 DIAGNOSIS — R195 Other fecal abnormalities: Secondary | ICD-10-CM | POA: Diagnosis not present

## 2018-08-18 DIAGNOSIS — E1165 Type 2 diabetes mellitus with hyperglycemia: Secondary | ICD-10-CM

## 2018-08-18 DIAGNOSIS — E538 Deficiency of other specified B group vitamins: Secondary | ICD-10-CM

## 2018-08-18 DIAGNOSIS — R194 Change in bowel habit: Secondary | ICD-10-CM

## 2018-08-18 DIAGNOSIS — Z9229 Personal history of other drug therapy: Secondary | ICD-10-CM

## 2018-08-18 DIAGNOSIS — E559 Vitamin D deficiency, unspecified: Secondary | ICD-10-CM

## 2018-08-18 DIAGNOSIS — E785 Hyperlipidemia, unspecified: Secondary | ICD-10-CM | POA: Diagnosis not present

## 2018-08-18 DIAGNOSIS — R5383 Other fatigue: Secondary | ICD-10-CM

## 2018-08-18 DIAGNOSIS — K7460 Unspecified cirrhosis of liver: Secondary | ICD-10-CM

## 2018-08-18 LAB — LIPID PANEL
Cholesterol: 208 mg/dL — ABNORMAL HIGH (ref 0–200)
HDL: 47.3 mg/dL (ref >39.00)
LDL Cholesterol: 127 mg/dL — ABNORMAL HIGH (ref 0–99)
NonHDL: 160.24
Total CHOL/HDL Ratio: 4
Triglycerides: 166 mg/dL — ABNORMAL HIGH (ref 0.0–149.0)
VLDL: 33.2 mg/dL (ref 0.0–40.0)

## 2018-08-18 LAB — VITAMIN D 25 HYDROXY (VIT D DEFICIENCY, FRACTURES): VITD: 35.58 ng/mL (ref 30.00–100.00)

## 2018-08-18 LAB — HEMOGLOBIN A1C: HEMOGLOBIN A1C: 6.2 % (ref 4.6–6.5)

## 2018-08-18 MED ORDER — ROSUVASTATIN CALCIUM 10 MG PO TABS: 10.0000 mg | ORAL_TABLET | Freq: Every day | ORAL | 3 refills | Status: DC

## 2018-08-18 MED ORDER — CYANOCOBALAMIN 1000 MCG/ML IJ SOLN: INTRAMUSCULAR | 15 refills | Status: DC

## 2018-08-18 NOTE — Progress Notes (Signed)
Subjective:    Patient ID: Kathy Bryant, female    DOB: 1945-11-06, 73 y.o.   MRN: [id number]  CC: [name] is a 73 y.o. female who presents today for follow up.   HPI: Overall feels well today.   Fatty liver- Following with Dr Anson Fret like to start Hep B series.  Has lost 14 pounds.   DM- compliant with metformin. No diarrhea.    notes having BM every 2-3 days, couple of months now. No abdominal pain or discomfort. Stool more narrow. NO blood. No straining.   Fatigue-unchanged.  would like to start b12.            Hep b surface ab-  Fatigue- on b12? Following with weight management.  HISTORY:  Past Medical History:  Diagnosis Date  . Anxiety   . Bilateral calf pain   . Diabetes mellitus without complication ([place])    PT STATES SHE WAS ON METFORMIN AND STOPPED TAKING THIS ON HER OWN  . Fatty liver   . 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 Herniated disc, cervical   . HTN (hypertension)    PT WAS ON BP MEDS AND THEN STATES HER BP WAS UNDER CONTROL AND SHE STOPPED TAKING BP ON HER OWN  . Microscopic hematuria   . Obesity   . Swelling of both lower extremities    Past Surgical History:  Procedure Laterality Date  . APPENDECTOMY  1968  . BREAST BIOPSY Right 2013   x2 BENIGN BREAST TISSUE WITH FOCAL HYALINIZED STROMA AND BENIGN BREAST TISSUE WITH FOCAL FIBROADENOMATOUS CHANGE   . [place]  . CHOLECYSTECTOMY    . COLONOSCOPY  03/05/2014  . [place]  . HEMORRHOID SURGERY N/A 12/23/2016   Procedure: HEMORRHOIDECTOMY;  Surgeon: Christene Lye, MD;  Location: [facility] ORS;  Service: General;  Laterality: N/A;  . HERNIA REPAIR     Abdominal  . TOTAL ABDOMINAL HYSTERECTOMY W/ BILATERAL SALPINGOOPHORECTOMY  1998   Family History  Problem Relation Age of Onset  . Diabetes Mother   . Cancer Mother 21       Brain Tumor - died age 93  . Heart disease Father        CAD - died MI - age 28  . Hypertension Father   . High  Cholesterol Father   . Diabetes Sister   . Diabetes Brother   . Diabetes Brother   . Cancer Paternal Aunt        Renal Cell Ca  . Cancer Paternal Uncle        Renal Cell Ca  . Breast cancer Neg Hx     Allergies: Simvastatin Current Outpatient Medications on File Prior to Visit  Medication Sig Dispense Refill  . ALPRAZolam (XANAX) 0.25 MG tablet Take 1 tablet (0.25 mg total) by mouth 2 (two) times daily as needed for anxiety. 30 tablet 0  . metFORMIN (GLUCOPHAGE XR) 500 MG 24 hr tablet Start 500mg  PO qpm. 90 tablet 3  . neomycin-polymyxin b-dexamethasone (MAXITROL) 3.5-10000-0.1 SUSP Place 1 drop into both eyes 2 (two) times daily as needed (FOR EYE IRRITATION/INFECTION).     No current facility-administered medications on file prior to visit.     Social History   Tobacco Use  . Smoking status: Former Smoker    Types: Cigarettes    Last attempt to quit: 12/23/2006    Years since quitting: 11.6  . Smokeless tobacco: Never Used  . Tobacco comment: "I  WAS A CLOSET SMOKER AND NEVER SMOKED ALOT"  Substance Use Topics  . Alcohol use: Yes    Comment: Occasional glass of wine  . Drug use: No    Review of Systems  Constitutional: Positive for fatigue. Negative for chills and fever.  Respiratory: Negative for cough.   Cardiovascular: Negative for chest pain and palpitations.  Gastrointestinal: Positive for constipation. Negative for abdominal distention, abdominal pain, anal bleeding, blood in stool, diarrhea, nausea and vomiting.      Objective:    BP 118/64 (BP Location: Left Arm, Patient Position: Sitting, Cuff Size: Large)   Pulse 76   Temp 97.9 F (36.6 C)   Wt 197 lb 9.6 oz (89.6 kg)   SpO2 96%   BMI 35.00 kg/m  BP Readings from Last 3 Encounters:  08/18/18 118/64  08/15/18 103/68  07/24/18 116/71   Wt Readings from Last 3 Encounters:  08/18/18 197 lb 9.6 oz (89.6 kg)  08/15/18 193 lb (87.5 kg)  07/24/18 194 lb (88 kg)    Physical Exam Vitals signs reviewed.   Constitutional:      Appearance: She is well-developed.  Eyes:     Conjunctiva/sclera: Conjunctivae normal.  Cardiovascular:     Rate and Rhythm: Normal rate and regular rhythm.     Pulses: Normal pulses.     Heart sounds: Normal heart sounds.  Pulmonary:     Effort: Pulmonary effort is normal.     Breath sounds: Normal breath sounds. No wheezing, rhonchi or rales.  Skin:    General: Skin is warm and dry.  Neurological:     Mental Status: She is alert.  Psychiatric:        Speech: Speech normal.        Behavior: Behavior normal.        Thought Content: Thought content normal.        Assessment & Plan:   Problem List Items Addressed This Visit      Digestive   Cirrhosis of liver ([place])    Started Hep B series as she is non immune. She will continue to follow with Allen Norris        Endocrine   DM (diabetes mellitus) ([place])    Pleased with weight loss. Pending a1c.      Relevant Medications   rosuvastatin (CRESTOR) 10 MG tablet   Other Relevant Orders   Microalbumin / creatinine urine ratio   Hemoglobin A1c     Other   Fatigue    B12 on low end. Trial of b12 injections.       Vitamin D deficiency   Relevant Orders   VITAMIN D 25 Hydroxy (Vit-D Deficiency, Fractures)   B12 deficiency   Relevant Medications   cyanocobalamin (,VITAMIN B-12,) 1000 MCG/ML injection   HLD (hyperlipidemia)    Stop zocor to nightmares; trial of crestor.       Relevant Medications   rosuvastatin (CRESTOR) 10 MG tablet   Other Relevant Orders   Lipid panel   Change in bowel habit    Stool cards pending. due for colonoscopy this year.  Advised patient that she needs to discuss this ASAP with her gastroenterologist, as change in stool shape can be from a polyp.       Other Visit Diagnoses    Change in stool    -  Primary   Relevant Orders   Fecal occult blood, imunochemical   Has received first dose of hepatitis B vaccine       Relevant Orders  Heplisav-B (HepB-CPG) Vaccine  (Completed)       I have discontinued Jasmia R. Szilagyi's simvastatin and Vitamin D (Ergocalciferol). I am also having her start on cyanocobalamin and rosuvastatin. Additionally, I am having her maintain her neomycin-polymyxin b-dexamethasone, ALPRAZolam, and metFORMIN.   Meds ordered this encounter  Medications  . cyanocobalamin (,VITAMIN B-12,) 1000 MCG/ML injection    Sig: 1000 mcg (1 mg) injection once per week for four weeks, followed by 1000 mcg injection once per month.    Dispense:  1 mL    Refill:  15    Order Specific Question:   Supervising Provider    Answer:   Deborra Medina L [[id number]]  . rosuvastatin (CRESTOR) 10 MG tablet    Sig: Take 1 tablet (10 mg total) by mouth daily.    Dispense:  90 tablet    Refill:  3    Order Specific Question:   Supervising Provider    Answer:   Crecencio Mc [[id number]]    Return precautions given.   Risks, benefits, and alternatives of the medications and treatment plan prescribed today were discussed, and patient expressed understanding.   Education regarding symptom management and diagnosis given to patient on AVS.  Continue to follow with Burnard Hawthorne, FNP for routine health maintenance.   Kathy Bryant and I agreed with plan.   [name], FNP

## 2018-08-18 NOTE — Assessment & Plan Note (Signed)
Stop zocor to nightmares; trial of crestor.

## 2018-08-18 NOTE — Assessment & Plan Note (Signed)
B12 on low end. Trial of b12 injections.

## 2018-08-18 NOTE — Assessment & Plan Note (Signed)
Advised to stop 50,000 vitamin D megadose until we have her vitamin D serum.  Patient understood

## 2018-08-18 NOTE — Assessment & Plan Note (Signed)
Stool cards pending. due for colonoscopy this year.  Advised patient that she needs to discuss this ASAP with her gastroenterologist, as change in stool shape can be from a polyp.

## 2018-08-18 NOTE — Patient Instructions (Addendum)
Start b12 at home  Hold vitamin d 50,000 units. If your vitamin D I normal, you do not need.   You may have Shingrex   If you do not have regular bowl movements , more daily, please call dr Allen Norris and please ensure he is aware of stool change in shape. Your colonoscopy is due this year.   Return stool cards to my office  Constipation plan  1) Take Miralax once to twice per day until you have a bowel movement. You will end up titrating the use of Miralax. For example, you  may find that using the medication every other day or three times a week is a good bowel regimen for you. Or perhaps, twice weekly.   2)  If you do not get results with Miralax alone, you may then add Bisacodyl suppository daily to regimen until you get desired bowel results.  3) Take Colace ( stool softener) twice daily every day.   It is MOST important to drink LOTS of water and follow a HIGH fiber diet to keep foods moving through the gut. You may add Metamucil to a beverage that you drink.  Information on prevention of constipation as well as acute treatment for constipation as included below.  If there is no improvement in your symptoms, or if there is any worsening of symptoms, or if you have any additional concerns, please return to this clinic for re-evaluation; or, if we are closed, consider going to the Emergency Room for evaluation.    Constipation Prevention What is Constipation? Constipation is hard, dry bowel movements or the inability to have a bowel movement.  You can also feel like you need to have a bowel movement but not be able to.  It can also be painful when you strain to have a bowel movement.  Taking narcotic pain medicine after surgery can make you constipated, even if you have never had a problem with constipation. What Do I Need To Do? The best thing to do for constipation is to keep it from happening.  This can be done by: Adding laxatives to your daily routine, when taking prescription pain  medicines after surgery. Add 17 gm Miralax daily or 100 mg Colace once or twice daily. (Miralax is mixed in water. Colace is a pill). They soften your bowel movements to make them easier to pass and hurt less. Drink plenty of water to help flush your bowels.  (Eight, 8 ounce glasses daily) Eat foods high in fiber such as whole grains, vegetables, cereals, fruits, and prune juice (5-7 servings a day or 25 grams).  If you do not know how much fiber a food has in it you can look on the label under dietary fiber.  If you have trouble getting enough fiber in your diet you may want to consider a fiber supplement such as Metamucil or Citrucel.  Also, be aware that eating fiber without drinking enough water can make constipation worse. If you do become constipated some medications that may help are: Bisacodyl (Dulcolax) is available in tablet form or a suppository. Glycerin suppositories are also a good choice if you need a fast acting medication. Everybody is different and may have different results.  Talk to your pharmacist or health care provider about your specific problems. They can help you choose the best product for you.  Why Is It Important for Me To Do This? Being constipated is not something you have to live with.  There are many things you can do  to help.   Feeling bad can interfere with your recovery after surgery.  If constipation goes on for too long it can become a very serious medical problem. You may need to visit your doctor or go to the hospital.  That is why it is very important to drink lots of water, eat enough fiber, and keep it from happening.  Ask Questions We want to answer all of your questions and concerns.  Thats why we encourage you to use a program called Ask Me 3, created by the [employer].  By using Ask Me 3 you are encouraged to ask 3 simple (yet, potentially life saving questions) whenever you are talking with your physician, nurse or  pharmacist: What is my main problem? What do I need to do? Why is it important for me to do this? By understanding the answer to these three questions and any other questions you may have, you have the knowledge necessary to manage your health. Please feel very comfortable asking any questions. Healthcare is complicated, so if you hear an answer you do not understand, please ask your health care team to explain again.   Sources: [employer] Med[employer] Plus 05-28-10 N

## 2018-08-18 NOTE — Addendum Note (Signed)
Addended by: Arby Barrette on: 08/18/2018 10:45 AM   Modules accepted: Orders

## 2018-08-18 NOTE — Assessment & Plan Note (Signed)
Started Hep B series as she is non immune. She will continue to follow with [facility]

## 2018-08-18 NOTE — Assessment & Plan Note (Signed)
Pleased with weight loss. Pending a1c.

## 2018-08-22 ENCOUNTER — Encounter: Payer: Self-pay | Admitting: Family

## 2018-09-04 ENCOUNTER — Ambulatory Visit (INDEPENDENT_AMBULATORY_CARE_PROVIDER_SITE_OTHER): Payer: Medicare Other | Admitting: Family Medicine

## 2018-09-04 ENCOUNTER — Encounter (INDEPENDENT_AMBULATORY_CARE_PROVIDER_SITE_OTHER): Payer: Self-pay | Admitting: Family Medicine

## 2018-09-04 VITALS — BP 95/62 | HR 59 | Temp 98.1°F | Ht 63.0 in | Wt 190.0 lb

## 2018-09-04 DIAGNOSIS — E119 Type 2 diabetes mellitus without complications: Secondary | ICD-10-CM | POA: Diagnosis not present

## 2018-09-04 DIAGNOSIS — E669 Obesity, unspecified: Secondary | ICD-10-CM

## 2018-09-04 DIAGNOSIS — E559 Vitamin D deficiency, unspecified: Secondary | ICD-10-CM | POA: Diagnosis not present

## 2018-09-04 DIAGNOSIS — Z6833 Body mass index (BMI) 33.0-33.9, adult: Secondary | ICD-10-CM

## 2018-09-04 MED ORDER — METFORMIN HCL ER 500 MG PO TB24: ORAL_TABLET | ORAL | 0 refills | Status: DC

## 2018-09-04 NOTE — Progress Notes (Signed)
Office: [phone]  /  Fax: [phone]   HPI:   Chief Complaint: OBESITY Kathy Bryant is here to discuss her progress with her obesity treatment plan. She is on the Category 1 plan and is following her eating plan approximately 95 % of the time. She states she is exercising 0 minutes 0 times per week. Kathy Bryant is surprised she lost weight because she has "cheated". She is meeting her protein goal daily. Her weight is 190 lb (86.2 kg) today and has had a weight loss of 3 pounds over a period of 3 weeks since her last visit. She has lost 19 lbs since starting treatment with [place].  Vitamin D deficiency Kathy Bryant has a diagnosis of vitamin D deficiency. She is currently taking prescription Vit D and denies nausea, vomiting or muscle weakness. Her last vitamin D level was 35 on 08/18/2017. She is not at goal.  Diabetes II Kathy Bryant has a diagnosis of diabetes type II. Kathy Bryant states patient does not check sugars. She denies any hypoglycemic episodes. She is positive for night time sweet cravings.  Last A1c was Hemoglobin A1C Latest Ref Rng & Units 08/18/2018 05/16/2018  HGBA1C 4.6 - 6.5 % 6.2 6.7(H)  Some recent data might be hidden    She has been working on intensive lifestyle modifications including diet, exercise, and weight loss to help control her blood glucose levels.   ASSESSMENT AND PLAN:  Vitamin D deficiency  Type 2 diabetes mellitus without complication, without long-term current use of insulin (Kathy Bryant) - Plan: metFORMIN (GLUCOPHAGE XR) 500 MG 24 hr tablet  Class 1 obesity with serious comorbidity and body mass index (BMI) of 33.0 to 33.9 in adult, unspecified obesity type  PLAN:  Vitamin D Deficiency Kathy Bryant was informed that low vitamin D levels contributes to fatigue and are associated with obesity, breast, and colon cancer. She agrees to continue to take prescription Vit D @50 ,000 IU every week and will follow up for routine testing of vitamin D, at least 2-3 times per year. She  was informed of the risk of over-replacement of vitamin D and agrees to not increase her dose unless she discusses this with [place] first. Kathy Bryant agrees to follow up with our clinic in 2 weeks.  Diabetes II Kathy Bryant has been given extensive diabetes education by myself today including ideal fasting and post-prandial blood glucose readings, individual ideal Hgb A1c goals  and hypoglycemia prevention. We discussed the importance of good blood sugar control to decrease the likelihood of diabetic complications such as nephropathy, neuropathy, limb loss, blindness, coronary artery disease, and death. We discussed the importance of intensive lifestyle modification including diet, exercise and weight loss as the first line treatment for diabetes. Kathy Bryant agrees to change to taking metformin daily with dinner #30 with no refills rather than at bedtime. She will follow up with our clinic in 2 weeks.  Obesity Kathy Bryant is currently in the action stage of change. As such, her goal is to continue with weight loss efforts She has agreed to follow the Category 1 plan Kathy Bryant has been instructed to walk on the treadmill for 30 minutes 3 days a week. We discussed the following Behavioral Modification Strategies today Planning for success  Kathy Bryant has agreed to follow up with our clinic in 2 weeks. She was informed of the importance of frequent follow up visits to maximize her success with intensive lifestyle modifications for her multiple health conditions.  ALLERGIES: Allergies  Allergen Reactions  . Simvastatin Other (See Comments)    Nightmares  MEDICATIONS: Current Outpatient Medications on File Prior to Visit  Medication Sig Dispense Refill  . ALPRAZolam (XANAX) 0.25 MG tablet Take 1 tablet (0.25 mg total) by mouth 2 (two) times daily as needed for anxiety. 30 tablet 0  . cyanocobalamin (,VITAMIN B-12,) 1000 MCG/ML injection 1000 mcg (1 mg) injection once per week for four weeks, followed by 1000 mcg  injection once per month. 1 mL 15  . metFORMIN (GLUCOPHAGE XR) 500 MG 24 hr tablet Start 500mg  PO qpm. 90 tablet 3  . neomycin-polymyxin b-dexamethasone (MAXITROL) 3.5-10000-0.1 SUSP Place 1 drop into both eyes 2 (two) times daily as needed (FOR EYE IRRITATION/INFECTION).    Marland Kitchen rosuvastatin (CRESTOR) 10 MG tablet Take 1 tablet (10 mg total) by mouth daily. 90 tablet 3   No current facility-administered medications on file prior to visit.     PAST MEDICAL HISTORY: Past Medical History:  Diagnosis Date  . Anxiety   . Bilateral calf pain   . Diabetes mellitus without complication ([place])    PT STATES SHE WAS ON METFORMIN AND STOPPED TAKING THIS ON HER OWN  . Fatty liver   . 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 Herniated disc, cervical   . HTN (hypertension)    PT WAS ON BP MEDS AND THEN STATES HER BP WAS UNDER CONTROL AND SHE STOPPED TAKING BP ON HER OWN  . Microscopic hematuria   . Obesity   . Swelling of both lower extremities     PAST SURGICAL HISTORY: Past Surgical History:  Procedure Laterality Date  . APPENDECTOMY  1968  . BREAST BIOPSY Right 2013   x2 BENIGN BREAST TISSUE WITH FOCAL HYALINIZED STROMA AND BENIGN BREAST TISSUE WITH FOCAL FIBROADENOMATOUS CHANGE   . [place]  . CHOLECYSTECTOMY    . COLONOSCOPY  03/05/2014  . [place]  . HEMORRHOID SURGERY N/A 12/23/2016   Procedure: HEMORRHOIDECTOMY;  Surgeon: Christene Lye, MD;  Location: [facility] ORS;  Service: General;  Laterality: N/A;  . HERNIA REPAIR     Abdominal  . TOTAL ABDOMINAL HYSTERECTOMY W/ BILATERAL SALPINGOOPHORECTOMY  1998    SOCIAL HISTORY: Social History   Tobacco Use  . Smoking status: Former Smoker    Types: Cigarettes    Last attempt to quit: 12/23/2006    Years since quitting: 11.7  . Smokeless tobacco: Never Used  . Tobacco comment: "I WAS A CLOSET SMOKER AND NEVER SMOKED ALOT"  Substance Use Topics  . Alcohol use: Yes    Comment: Occasional glass of wine  .  Drug use: No    FAMILY HISTORY: Family History  Problem Relation Age of Onset  . Diabetes Mother   . Cancer Mother 19       Brain Tumor - died age 36  . Heart disease Father        CAD - died MI - age 28  . Hypertension Father   . High Cholesterol Father   . Diabetes Sister   . Diabetes Brother   . Diabetes Brother   . Cancer Paternal Aunt        Renal Cell Ca  . Cancer Paternal Uncle        Renal Cell Ca  . Breast cancer Neg Hx     ROS: Review of Systems  Constitutional: Positive for weight loss.  Gastrointestinal: Negative for diarrhea, nausea and vomiting.  Musculoskeletal:       Negative for muscle weakness  Endo/Heme/Allergies:  Negative for hypoglycemia     PHYSICAL EXAM: Blood pressure 95/62, pulse (!) 59, temperature 98.1 F (36.7 C), temperature source Oral, height 5\' 3"  (1.6 m), weight 190 lb (86.2 kg), SpO2 98 %. Body mass index is 33.66 kg/m. Physical Exam Vitals signs reviewed.  Constitutional:      Appearance: Normal appearance. She is obese.  Cardiovascular:     Rate and Rhythm: Normal rate.     Pulses: Normal pulses.  Pulmonary:     Effort: Pulmonary effort is normal.  Musculoskeletal: Normal range of motion.  Skin:    General: Skin is warm and dry.  Neurological:     Mental Status: She is alert and oriented to person, place, and time.  Psychiatric:        Mood and Affect: Mood normal.        Behavior: Behavior normal.     RECENT LABS AND TESTS: BMET    Component Value Date/Time   NA 140 05/16/2018 0856   K 3.9 05/16/2018 0856   CL 104 05/16/2018 0856   CO2 28 05/16/2018 0856   GLUCOSE 145 (H) 05/16/2018 0856   BUN 16 05/16/2018 0856   CREATININE 0.86 05/16/2018 0856   CALCIUM 9.5 05/16/2018 0856   GFRNONAA >60 12/23/2016 1203   GFRAA >60 12/23/2016 1203   Lab Results  Component Value Date   HGBA1C 6.2 08/18/2018   HGBA1C 6.7 (H) 05/16/2018   HGBA1C 6.3 05/02/2017   HGBA1C 6.3 02/25/2015   HGBA1C 6.4 10/18/2013    Lab Results  Component Value Date   INSULIN 21.3 06/12/2018   CBC    Component Value Date/Time   WBC 7.2 05/16/2018 0856   RBC 4.40 05/16/2018 0856   HGB 14.2 05/16/2018 0856   HCT 42.2 05/16/2018 0856   PLT 185.0 05/16/2018 0856   MCV 95.8 05/16/2018 0856   MCHC 33.7 05/16/2018 0856   RDW 13.7 05/16/2018 0856   LYMPHSABS 2.6 05/16/2018 0856   MONOABS 0.4 05/16/2018 0856   EOSABS 0.1 05/16/2018 0856   BASOSABS 0.0 05/16/2018 0856   Iron/TIBC/Ferritin/ %Sat    Component Value Date/Time   IRON 55 06/15/2018 1535   TIBC 311 06/15/2018 1535   FERRITIN 572 (H) 06/15/2018 1535   IRONPCTSAT 18 06/15/2018 1535   Lipid Panel     Component Value Date/Time   CHOL 208 (H) 08/18/2018 0944   TRIG 166.0 (H) 08/18/2018 0944   HDL 47.30 08/18/2018 0944   CHOLHDL 4 08/18/2018 0944   VLDL 33.2 08/18/2018 0944   LDLCALC 127 (H) 08/18/2018 0944   LDLDIRECT 105.0 05/16/2018 0856   Hepatic Function Panel     Component Value Date/Time   PROT 7.0 06/15/2018 1535   ALBUMIN 4.4 06/15/2018 1535   AST 60 (H) 06/15/2018 1535   ALT 67 (H) 06/15/2018 1535   ALKPHOS 73 06/15/2018 1535   BILITOT 0.3 06/15/2018 1535   BILIDIR 0.10 06/15/2018 1535      Component Value Date/Time   TSH 2.22 05/16/2018 0856   TSH 2.59 05/02/2017 0949   TSH 1.83 11/30/2013 0854      OBESITY BEHAVIORAL INTERVENTION VISIT  Today's visit was # 6   Starting weight: 209 lbs Starting date: 06/12/2018 Today's weight :: 190 lbs Today's date: 09/04/2018 Total lbs lost to date: 19 At least 15 minutes were spent on discussing the following behavioral intervention visit.   ASK: We discussed the diagnosis of obesity with Kathy Bryant today and Kathy Bryant agreed to give [place] permission to discuss  obesity behavioral modification therapy today.  ASSESS: Kathy Bryant has the diagnosis of obesity and her BMI today is 33.67 Kathy Bryant is in the action stage of change   ADVISE: Kathy Bryant was educated on the multiple  health risks of obesity as well as the benefit of weight loss to improve her health. She was advised of the need for long term treatment and the importance of lifestyle modifications to improve her current health and to decrease her risk of future health problems.  AGREE: Multiple dietary modification options and treatment options were discussed and  Kathy Bryant agreed to follow the recommendations documented in the above note.  ARRANGE: Kathy Bryant was educated on the importance of frequent visits to treat obesity as outlined per CMS and USPSTF guidelines and agreed to schedule her next follow up appointment today.  I, Tammy Wysor, am acting as Location manager for [employer].  I have reviewed the above documentation for accuracy and completeness, and I agree with the above.  - Casmer Yepiz, FNP-C.

## 2018-09-19 ENCOUNTER — Encounter (INDEPENDENT_AMBULATORY_CARE_PROVIDER_SITE_OTHER): Payer: Self-pay | Admitting: Family Medicine

## 2018-09-19 ENCOUNTER — Ambulatory Visit (INDEPENDENT_AMBULATORY_CARE_PROVIDER_SITE_OTHER): Payer: Medicare Other | Admitting: Family Medicine

## 2018-09-19 VITALS — BP 100/64 | HR 69 | Temp 97.9°F | Ht 63.0 in | Wt 190.0 lb

## 2018-09-19 DIAGNOSIS — E669 Obesity, unspecified: Secondary | ICD-10-CM

## 2018-09-19 DIAGNOSIS — F3289 Other specified depressive episodes: Secondary | ICD-10-CM

## 2018-09-19 DIAGNOSIS — Z6833 Body mass index (BMI) 33.0-33.9, adult: Secondary | ICD-10-CM | POA: Diagnosis not present

## 2018-09-19 DIAGNOSIS — E119 Type 2 diabetes mellitus without complications: Secondary | ICD-10-CM

## 2018-09-19 NOTE — Progress Notes (Signed)
Office: [phone]  /  Fax: [phone]   HPI:   Chief Complaint: OBESITY Kathy Bryant is here to discuss her progress with her obesity treatment plan. She is on the Category 1 plan and is following her eating plan approximately 75% of the time. She states she is exercising 0 minutes 0 times per week. Kathy Bryant has been craving sweets and reports increased stress at work. She is eating all of the food on the plan.  Her weight is 190 lb (86.2 kg) today and has not lost weight since her last visit. She has lost 15 lbs since starting treatment with [place].  Diabetes II Kathy Bryant has a diagnosis of diabetes type II, well controlled on Metformin. She does not check her sugars. She is positive for polyphagia at night. Denies hypoglycemic episodes. Last A1c was @ 6.2 on 08/18/2018. She has been working on intensive lifestyle modifications including diet and weight loss to help control her blood glucose levels.  Depression with emotional eating behaviors Kathy Bryant is struggling with emotional eating and using food for comfort to the extent that it is negatively impacting her health. She has been craving sweets after dinner and feels this is related to increased job stress. We discussed Topamax but she declined.   Depression screen Kathy Bryant 06/12/2018 05/12/2018 05/02/2017 12/15/2016  Decreased Interest 3 0 0 0  Down, Depressed, Hopeless 0 0 0 0  PHQ - 2 Score 3 0 0 0  Altered sleeping 0 - - -  Tired, decreased energy 3 - - -  Change in appetite 2 - - -  Feeling bad or failure about yourself  1 - - -  Trouble concentrating 0 - - -  Moving slowly or fidgety/restless 0 - - -  Suicidal thoughts 0 - - -  PHQ-9 Score 9 - - -  Difficult doing work/chores Not difficult at all - - -    ASSESSMENT AND PLAN:  Type 2 diabetes mellitus without complication, without long-term current use of insulin ([facility])  Other depression - with emotional eating  Class 1 obesity with serious comorbidity and body mass index (BMI) of  33.0 to 33.9 in adult, unspecified obesity type  PLAN:  Diabetes II Kathy Bryant has been given extensive diabetes education by myself today including ideal fasting and post-prandial blood glucose readings, individual ideal HgA1c goals  and hypoglycemia prevention. We discussed the importance of good blood sugar control to decrease the likelihood of diabetic complications such as nephropathy, neuropathy, limb loss, blindness, coronary artery disease, and death. We discussed the importance of intensive lifestyle modification including diet, exercise and weight loss as the first line treatment for diabetes. Kathy Bryant agrees to continue her metformin and her meal plan. She was instructed to take metformin at dinnertime to decrease cravings. Kathy Bryant agrees to follow-up with our clinic in 2 weeks.  Depression with Emotional Eating Behaviors We discussed behavior modification techniques today to help Kathy Bryant deal with her emotional eating and depression. We also discussed emotional eating strategies.   Obesity Kathy Bryant is currently in the action stage of change. As such, her goal is to continue with weight loss efforts. She has agreed to follow the Category 1 plan. Kathy Bryant has been advised to walk for 15 minutes twice weekly. Kathy Bryant has agreed to follow-up with our clinic in 2 weeks. She was informed of the importance of frequent follow-up visits to maximize her success with intensive lifestyle modifications for her multiple health conditions. Handout was given on emotional hunger versus physical hunger.  ALLERGIES: Allergies  Allergen Reactions  . Simvastatin Other (See Comments)    Nightmares    MEDICATIONS: Current Outpatient Medications on File Prior to Visit  Medication Sig Dispense Refill  . ALPRAZolam (XANAX) 0.25 MG tablet Take 1 tablet (0.25 mg total) by mouth 2 (two) times daily as needed for anxiety. 30 tablet 0  . cyanocobalamin (,VITAMIN B-12,) 1000 MCG/ML injection 1000 mcg (1 mg)  injection once per week for four weeks, followed by 1000 mcg injection once per month. 1 mL 15  . metFORMIN (GLUCOPHAGE XR) 500 MG 24 hr tablet Start 500mg  PO qpm. 30 tablet 0  . neomycin-polymyxin b-dexamethasone (MAXITROL) 3.5-10000-0.1 SUSP Place 1 drop into both eyes 2 (two) times daily as needed (FOR EYE IRRITATION/INFECTION).    Marland Kitchen rosuvastatin (CRESTOR) 10 MG tablet Take 1 tablet (10 mg total) by mouth daily. 90 tablet 3   No current facility-administered medications on file prior to visit.     PAST MEDICAL HISTORY: Past Medical History:  Diagnosis Date  . Anxiety   . Bilateral calf pain   . Diabetes mellitus without complication ([place])    PT STATES SHE WAS ON METFORMIN AND STOPPED TAKING THIS ON HER OWN  . Fatty liver   . 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 Herniated disc, cervical   . HTN (hypertension)    PT WAS ON BP MEDS AND THEN STATES HER BP WAS UNDER CONTROL AND SHE STOPPED TAKING BP ON HER OWN  . Microscopic hematuria   . Obesity   . Swelling of both lower extremities     PAST SURGICAL HISTORY: Past Surgical History:  Procedure Laterality Date  . APPENDECTOMY  1968  . BREAST BIOPSY Right 2013   x2 BENIGN BREAST TISSUE WITH FOCAL HYALINIZED STROMA AND BENIGN BREAST TISSUE WITH FOCAL FIBROADENOMATOUS CHANGE   . [place]  . CHOLECYSTECTOMY    . COLONOSCOPY  03/05/2014  . [place]  . HEMORRHOID SURGERY N/A 12/23/2016   Procedure: HEMORRHOIDECTOMY;  Surgeon: Christene Lye, MD;  Location: [facility] ORS;  Service: General;  Laterality: N/A;  . HERNIA REPAIR     Abdominal  . TOTAL ABDOMINAL HYSTERECTOMY W/ BILATERAL SALPINGOOPHORECTOMY  1998    SOCIAL HISTORY: Social History   Tobacco Use  . Smoking status: Former Smoker    Types: Cigarettes    Last attempt to quit: 12/23/2006    Years since quitting: 11.7  . Smokeless tobacco: Never Used  . Tobacco comment: "I WAS A CLOSET SMOKER AND NEVER SMOKED ALOT"  Substance Use Topics  .  Alcohol use: Yes    Comment: Occasional glass of wine  . Drug use: No    FAMILY HISTORY: Family History  Problem Relation Age of Onset  . Diabetes Mother   . Cancer Mother 55       Brain Tumor - died age 34  . Heart disease Father        CAD - died MI - age 85  . Hypertension Father   . High Cholesterol Father   . Diabetes Sister   . Diabetes Brother   . Diabetes Brother   . Cancer Paternal Aunt        Renal Cell Ca  . Cancer Paternal Uncle        Renal Cell Ca  . Breast cancer Neg Hx    ROS: Review of Systems  Constitutional: Negative for weight loss.  Endo/Heme/Allergies:       Positive for polyphagia at night. Negative  for hypoglycemia.   PHYSICAL EXAM: Blood pressure 100/64, pulse 69, temperature 97.9 F (36.6 C), temperature source Oral, height 5\' 3"  (1.6 m), weight 190 lb (86.2 kg), SpO2 93 %. Body mass index is 33.66 kg/m. Physical Exam Vitals signs reviewed.  Constitutional:      Appearance: Normal appearance. She is obese.  Cardiovascular:     Rate and Rhythm: Normal rate.     Pulses: Normal pulses.  Pulmonary:     Effort: Pulmonary effort is normal.     Breath sounds: Normal breath sounds.  Musculoskeletal: Normal range of motion.  Skin:    General: Skin is warm and dry.  Neurological:     Mental Status: She is alert and oriented to person, place, and time.  Psychiatric:        Behavior: Behavior normal.   RECENT LABS AND TESTS: BMET    Component Value Date/Time   NA 140 05/16/2018 0856   K 3.9 05/16/2018 0856   CL 104 05/16/2018 0856   CO2 28 05/16/2018 0856   GLUCOSE 145 (H) 05/16/2018 0856   BUN 16 05/16/2018 0856   CREATININE 0.86 05/16/2018 0856   CALCIUM 9.5 05/16/2018 0856   GFRNONAA >60 12/23/2016 1203   GFRAA >60 12/23/2016 1203   Lab Results  Component Value Date   HGBA1C 6.2 08/18/2018   HGBA1C 6.7 (H) 05/16/2018   HGBA1C 6.3 05/02/2017   HGBA1C 6.3 02/25/2015   HGBA1C 6.4 10/18/2013   Lab Results  Component Value Date    INSULIN 21.3 06/12/2018   CBC    Component Value Date/Time   WBC 7.2 05/16/2018 0856   RBC 4.40 05/16/2018 0856   HGB 14.2 05/16/2018 0856   HCT 42.2 05/16/2018 0856   PLT 185.0 05/16/2018 0856   MCV 95.8 05/16/2018 0856   MCHC 33.7 05/16/2018 0856   RDW 13.7 05/16/2018 0856   LYMPHSABS 2.6 05/16/2018 0856   MONOABS 0.4 05/16/2018 0856   EOSABS 0.1 05/16/2018 0856   BASOSABS 0.0 05/16/2018 0856   Iron/TIBC/Ferritin/ %Sat    Component Value Date/Time   IRON 55 06/15/2018 1535   TIBC 311 06/15/2018 1535   FERRITIN 572 (H) 06/15/2018 1535   IRONPCTSAT 18 06/15/2018 1535   Lipid Panel     Component Value Date/Time   CHOL 208 (H) 08/18/2018 0944   TRIG 166.0 (H) 08/18/2018 0944   HDL 47.30 08/18/2018 0944   CHOLHDL 4 08/18/2018 0944   VLDL 33.2 08/18/2018 0944   LDLCALC 127 (H) 08/18/2018 0944   LDLDIRECT 105.0 05/16/2018 0856   Hepatic Function Panel     Component Value Date/Time   PROT 7.0 06/15/2018 1535   ALBUMIN 4.4 06/15/2018 1535   AST 60 (H) 06/15/2018 1535   ALT 67 (H) 06/15/2018 1535   ALKPHOS 73 06/15/2018 1535   BILITOT 0.3 06/15/2018 1535   BILIDIR 0.10 06/15/2018 1535      Component Value Date/Time   TSH 2.22 05/16/2018 0856   TSH 2.59 05/02/2017 0949   TSH 1.83 11/30/2013 0854   Results for KATRINA, DADDONA (MRN [id number]) as of 09/19/2018 15:51  Ref. Range 08/18/2018 09:44  VITD Latest Ref Range: 30.00 - 100.00 ng/mL 35.58   OBESITY BEHAVIORAL INTERVENTION VISIT  Today's visit was #7  Starting weight: 205 lbs Starting date: 06/12/2018 Today's weight: 190 lbs Today's date: 09/19/2018 Total lbs lost to date: 15 At least 15 minutes were spent on discussing the following behavioral intervention visit.  ASK: We discussed the diagnosis of obesity with Kathy  R Bryant today and Karlei agreed to give [place] permission to discuss obesity behavioral modification therapy today.  ASSESS: Kathy Bryant has the diagnosis of obesity and her BMI today is  @ 33.66. Kathy Bryant is in the action stage of change.   ADVISE: Kathy Bryant was educated on the multiple health risks of obesity as well as the benefit of weight loss to improve her health. She was advised of the need for long term treatment and the importance of lifestyle modifications to improve her current health and to decrease her risk of future health problems.  AGREE: Multiple dietary modification options and treatment options were discussed and  Kathy Bryant agreed to follow the recommendations documented in the above note.  ARRANGE: Kathy Bryant was educated on the importance of frequent visits to treat obesity as outlined per CMS and USPSTF guidelines and agreed to schedule her next follow up appointment today.  IMichaelene Song, am acting as Location manager for [employer], FNP-C.  I have reviewed the above documentation for accuracy and completeness, and I agree with the above.  - Donel Osowski, FNP-C.

## 2018-09-20 ENCOUNTER — Ambulatory Visit (INDEPENDENT_AMBULATORY_CARE_PROVIDER_SITE_OTHER): Payer: Medicare Other

## 2018-09-20 ENCOUNTER — Encounter (INDEPENDENT_AMBULATORY_CARE_PROVIDER_SITE_OTHER): Payer: Self-pay | Admitting: Family Medicine

## 2018-09-20 DIAGNOSIS — Z23 Encounter for immunization: Secondary | ICD-10-CM

## 2018-09-20 DIAGNOSIS — F32A Depression, unspecified: Secondary | ICD-10-CM | POA: Insufficient documentation

## 2018-09-20 DIAGNOSIS — F329 Major depressive disorder, single episode, unspecified: Secondary | ICD-10-CM | POA: Insufficient documentation

## 2018-09-20 NOTE — Progress Notes (Signed)
Pt was seen today for second dose of hep B given IM in the LD. Pt tolerated well.

## 2018-10-05 ENCOUNTER — Ambulatory Visit (INDEPENDENT_AMBULATORY_CARE_PROVIDER_SITE_OTHER): Payer: Medicare Other | Admitting: Family Medicine

## 2018-10-05 ENCOUNTER — Encounter (INDEPENDENT_AMBULATORY_CARE_PROVIDER_SITE_OTHER): Payer: Self-pay | Admitting: Family Medicine

## 2018-10-05 VITALS — BP 105/69 | HR 73 | Temp 97.6°F | Ht 63.0 in | Wt 188.0 lb

## 2018-10-05 DIAGNOSIS — E559 Vitamin D deficiency, unspecified: Secondary | ICD-10-CM | POA: Diagnosis not present

## 2018-10-05 DIAGNOSIS — E119 Type 2 diabetes mellitus without complications: Secondary | ICD-10-CM

## 2018-10-05 DIAGNOSIS — Z6833 Body mass index (BMI) 33.0-33.9, adult: Secondary | ICD-10-CM | POA: Diagnosis not present

## 2018-10-05 DIAGNOSIS — E669 Obesity, unspecified: Secondary | ICD-10-CM

## 2018-10-05 MED ORDER — VITAMIN D (ERGOCALCIFEROL) 1.25 MG (50000 UNIT) PO CAPS: 50000.0000 [IU] | ORAL_CAPSULE | ORAL | 0 refills | Status: DC

## 2018-10-05 NOTE — Progress Notes (Signed)
Office: [phone]  /  Fax: [phone]   HPI:   Chief Complaint: OBESITY Kathy Bryant is here to discuss her progress with her obesity treatment plan. She is on the Category 1 plan and is following her eating plan approximately 99% of the time. She states she is exercising 0 minutes 0 times per week. Kathy Bryant is doing well with the plan but is frustrated with slow weight loss. Her weight is 188 lb (85.3 kg) today and has had a weight loss of 2 pounds over a period of 2 weeks since her last visit. She has lost 17 lbs since starting treatment with [place].  Diabetes  Kathy Bryant has a diagnosis of diabetes which is well controlled on metformin. Kathy Bryant states she does not check her sugars. Last A1c was 6.2 on 08/18/2018. She has been working on intensive lifestyle modifications including diet, exercise, and weight loss to help control her blood glucose levels.  Vitamin D deficiency Kathy Bryant has a diagnosis of Vitamin D deficiency. She is currently taking prescription Vit D and denies nausea, vomiting or muscle weakness.  ASSESSMENT AND PLAN:  Type 2 diabetes mellitus without complication, without long-term current use of insulin (HCC)  Vitamin D deficiency - Plan: Vitamin D, Ergocalciferol, (DRISDOL) 1.25 MG (50000 UT) CAPS capsule  Class 1 obesity with serious comorbidity and body mass index (BMI) of 33.0 to 33.9 in adult, unspecified obesity type  PLAN:  Diabetes Kathy Bryant has been given extensive diabetes education by myself today including ideal fasting and post-prandial blood glucose readings, individual ideal HgA1c goals  and hypoglycemia prevention. We discussed the importance of good blood sugar control to decrease the likelihood of diabetic complications such as nephropathy, neuropathy, limb loss, blindness, coronary artery disease, and death. We discussed the importance of intensive lifestyle modification including diet, exercise and weight loss as the first line treatment for diabetes.  [name] agrees to continue her metformin and will follow-up at the agreed upon time.  Vitamin D Deficiency Kathy Bryant was informed that low Vitamin D levels contributes to fatigue and are associated with obesity, breast, and colon cancer. She agrees to continue to take prescription Vit D @ 50,000 IU every week #4 with 0 refills and will follow-up for routine testing of Vitamin D, at least 2-3 times per year. She was informed of the risk of over-replacement of Vitamin D and agrees to not increase her dose unless she discusses this with [place] first. Kathy Bryant agrees to follow-up with our clinic in 4 weeks.  Obesity Kathy Bryant is currently in the action stage of change. As such, her goal is to continue with weight loss efforts. She has agreed to follow the Category 1 plan. Kathy Bryant was assured she is doing well on the plan and encouragement was provided. Kathy Bryant has been instructed to start walking 3 times per week. We discussed the following Behavioral Modification Strategies today: planning for success.  Kathy Bryant has agreed to follow-up with our clinic in 4 weeks and will meet with the registered dietitian in 2 weeks. She was informed of the importance of frequent follow up visits to maximize her success with intensive lifestyle modifications for her multiple health conditions.  ALLERGIES: Allergies  Allergen Reactions  . Simvastatin Other (See Comments)    Nightmares    MEDICATIONS: Current Outpatient Medications on File Prior to Visit  Medication Sig Dispense Refill  . ALPRAZolam (XANAX) 0.25 MG tablet Take 1 tablet (0.25 mg total) by mouth 2 (two) times daily as needed for anxiety. 30 tablet 0  . cyanocobalamin (,  VITAMIN B-12,) 1000 MCG/ML injection 1000 mcg (1 mg) injection once per week for four weeks, followed by 1000 mcg injection once per month. 1 mL 15  . metFORMIN (GLUCOPHAGE XR) 500 MG 24 hr tablet Start 500mg  PO qpm. 30 tablet 0  . neomycin-polymyxin b-dexamethasone (MAXITROL) 3.5-10000-0.1  SUSP Place 1 drop into both eyes 2 (two) times daily as needed (FOR EYE IRRITATION/INFECTION).    Marland Kitchen rosuvastatin (CRESTOR) 10 MG tablet Take 1 tablet (10 mg total) by mouth daily. 90 tablet 3   No current facility-administered medications on file prior to visit.     PAST MEDICAL HISTORY: Past Medical History:  Diagnosis Date  . Anxiety   . Bilateral calf pain   . Diabetes mellitus without complication ([place])    PT STATES SHE WAS ON METFORMIN AND STOPPED TAKING THIS ON HER OWN  . Fatty liver   . 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 Herniated disc, cervical   . HTN (hypertension)    PT WAS ON BP MEDS AND THEN STATES HER BP WAS UNDER CONTROL AND SHE STOPPED TAKING BP ON HER OWN  . Microscopic hematuria   . Obesity   . Swelling of both lower extremities     PAST SURGICAL HISTORY: Past Surgical History:  Procedure Laterality Date  . APPENDECTOMY  1968  . BREAST BIOPSY Right 2013   x2 BENIGN BREAST TISSUE WITH FOCAL HYALINIZED STROMA AND BENIGN BREAST TISSUE WITH FOCAL FIBROADENOMATOUS CHANGE   . [place]  . CHOLECYSTECTOMY    . COLONOSCOPY  03/05/2014  . [place]  . HEMORRHOID SURGERY N/A 12/23/2016   Procedure: HEMORRHOIDECTOMY;  Surgeon: Christene Lye, MD;  Location: [facility] ORS;  Service: General;  Laterality: N/A;  . HERNIA REPAIR     Abdominal  . TOTAL ABDOMINAL HYSTERECTOMY W/ BILATERAL SALPINGOOPHORECTOMY  1998    SOCIAL HISTORY: Social History   Tobacco Use  . Smoking status: Former Smoker    Types: Cigarettes    Last attempt to quit: 12/23/2006    Years since quitting: 11.7  . Smokeless tobacco: Never Used  . Tobacco comment: "I WAS A CLOSET SMOKER AND NEVER SMOKED ALOT"  Substance Use Topics  . Alcohol use: Yes    Comment: Occasional glass of wine  . Drug use: No    FAMILY HISTORY: Family History  Problem Relation Age of Onset  . Diabetes Mother   . Cancer Mother 26       Brain Tumor - died age 61  . Heart disease Father         CAD - died MI - age 76  . Hypertension Father   . High Cholesterol Father   . Diabetes Sister   . Diabetes Brother   . Diabetes Brother   . Cancer Paternal Aunt        Renal Cell Ca  . Cancer Paternal Uncle        Renal Cell Ca  . Breast cancer Neg Hx     ROS: Review of Systems  Constitutional: Positive for weight loss.  Gastrointestinal: Negative for nausea and vomiting.  Musculoskeletal:       Negative for muscle weakness.  Endo/Heme/Allergies:       Negative for hypoglycemia.   PHYSICAL EXAM: Blood pressure 105/69, pulse 73, temperature 97.6 F (36.4 C), height 5\' 3"  (1.6 m), weight 188 lb (85.3 kg), SpO2 94 %. Body mass index is 33.3 kg/m. Physical Exam Vitals signs reviewed.  Constitutional:  Appearance: Normal appearance. She is obese.  Cardiovascular:     Rate and Rhythm: Normal rate.     Pulses: Normal pulses.  Pulmonary:     Effort: Pulmonary effort is normal.     Breath sounds: Normal breath sounds.  Musculoskeletal: Normal range of motion.  Skin:    General: Skin is warm and dry.  Neurological:     Mental Status: She is alert and oriented to person, place, and time.  Psychiatric:        Behavior: Behavior normal.   RECENT LABS AND TESTS: BMET    Component Value Date/Time   NA 140 05/16/2018 0856   K 3.9 05/16/2018 0856   CL 104 05/16/2018 0856   CO2 28 05/16/2018 0856   GLUCOSE 145 (H) 05/16/2018 0856   BUN 16 05/16/2018 0856   CREATININE 0.86 05/16/2018 0856   CALCIUM 9.5 05/16/2018 0856   GFRNONAA >60 12/23/2016 1203   GFRAA >60 12/23/2016 1203   Lab Results  Component Value Date   HGBA1C 6.2 08/18/2018   HGBA1C 6.7 (H) 05/16/2018   HGBA1C 6.3 05/02/2017   HGBA1C 6.3 02/25/2015   HGBA1C 6.4 10/18/2013   Lab Results  Component Value Date   INSULIN 21.3 06/12/2018   CBC    Component Value Date/Time   WBC 7.2 05/16/2018 0856   RBC 4.40 05/16/2018 0856   HGB 14.2 05/16/2018 0856   HCT 42.2 05/16/2018 0856   PLT 185.0  05/16/2018 0856   MCV 95.8 05/16/2018 0856   MCHC 33.7 05/16/2018 0856   RDW 13.7 05/16/2018 0856   LYMPHSABS 2.6 05/16/2018 0856   MONOABS 0.4 05/16/2018 0856   EOSABS 0.1 05/16/2018 0856   BASOSABS 0.0 05/16/2018 0856   Iron/TIBC/Ferritin/ %Sat    Component Value Date/Time   IRON 55 06/15/2018 1535   TIBC 311 06/15/2018 1535   FERRITIN 572 (H) 06/15/2018 1535   IRONPCTSAT 18 06/15/2018 1535   Lipid Panel     Component Value Date/Time   CHOL 208 (H) 08/18/2018 0944   TRIG 166.0 (H) 08/18/2018 0944   HDL 47.30 08/18/2018 0944   CHOLHDL 4 08/18/2018 0944   VLDL 33.2 08/18/2018 0944   LDLCALC 127 (H) 08/18/2018 0944   LDLDIRECT 105.0 05/16/2018 0856   Hepatic Function Panel     Component Value Date/Time   PROT 7.0 06/15/2018 1535   ALBUMIN 4.4 06/15/2018 1535   AST 60 (H) 06/15/2018 1535   ALT 67 (H) 06/15/2018 1535   ALKPHOS 73 06/15/2018 1535   BILITOT 0.3 06/15/2018 1535   BILIDIR 0.10 06/15/2018 1535      Component Value Date/Time   TSH 2.22 05/16/2018 0856   TSH 2.59 05/02/2017 0949   TSH 1.83 11/30/2013 0854    Ref. Range 08/18/2018 09:44  VITD Latest Ref Range: 30.00 - 100.00 ng/mL 35.58   OBESITY BEHAVIORAL INTERVENTION VISIT  Today's visit was #8  Starting weight: 205 lbs Starting date: 06/12/2018 Today's weight: 188 lbs  Today's date: 10/05/2018 Total lbs lost to date: 17 At least 15 minutes were spent on discussing the following behavioral intervention visit.    10/05/2018  Height 5\' 3"  (1.6 m)  Weight 188 lb (85.3 kg)  BMI (Calculated) 33.31  BLOOD PRESSURE - SYSTOLIC 629  BLOOD PRESSURE DIASTOLIC 69   Body Fat % 52.8 %  Total Body Water (lbs) 72.2 lbs   ASK: We discussed the diagnosis of obesity with Jalyn R Ciampi today and Georgenia agreed to give [place] permission to discuss obesity behavioral  modification therapy today.  ASSESS: Borghild has the diagnosis of obesity and her BMI today is 33.31. Jernee is in the action stage of change.    ADVISE: Mozella was educated on the multiple health risks of obesity as well as the benefit of weight loss to improve her health. She was advised of the need for long term treatment and the importance of lifestyle modifications to improve her current health and to decrease her risk of future health problems.  AGREE: Multiple dietary modification options and treatment options were discussed and  Candyce agreed to follow the recommendations documented in the above note.  ARRANGE: Danyale was educated on the importance of frequent visits to treat obesity as outlined per CMS and USPSTF guidelines and agreed to schedule her next follow up appointment today.  IMichaelene Song, am acting as Location manager for [employer], FNP-C.  I have reviewed the above documentation for accuracy and completeness, and I agree with the above.  - Jaimi Belle, FNP-C.

## 2018-10-12 ENCOUNTER — Telehealth: Payer: Self-pay | Admitting: Gastroenterology

## 2018-10-12 NOTE — Telephone Encounter (Signed)
Pt left vm stating someone was trying to reach her from 05/2018??

## 2018-10-17 NOTE — Telephone Encounter (Signed)
I have not tried to contact this pt. Not sure what she is referring to.

## 2018-10-19 ENCOUNTER — Ambulatory Visit (INDEPENDENT_AMBULATORY_CARE_PROVIDER_SITE_OTHER): Payer: Medicare Other | Admitting: Dietician

## 2018-10-25 ENCOUNTER — Other Ambulatory Visit (INDEPENDENT_AMBULATORY_CARE_PROVIDER_SITE_OTHER): Payer: Self-pay | Admitting: Family Medicine

## 2018-10-25 DIAGNOSIS — E119 Type 2 diabetes mellitus without complications: Secondary | ICD-10-CM

## 2018-10-25 DIAGNOSIS — E559 Vitamin D deficiency, unspecified: Secondary | ICD-10-CM

## 2018-10-25 MED ORDER — METFORMIN HCL ER 500 MG PO TB24: ORAL_TABLET | ORAL | 0 refills | Status: DC

## 2018-10-25 MED ORDER — VITAMIN D (ERGOCALCIFEROL) 1.25 MG (50000 UNIT) PO CAPS: 50000.0000 [IU] | ORAL_CAPSULE | ORAL | 0 refills | Status: DC

## 2018-11-02 ENCOUNTER — Ambulatory Visit (INDEPENDENT_AMBULATORY_CARE_PROVIDER_SITE_OTHER): Payer: Medicare Other | Admitting: Family Medicine

## 2018-11-02 ENCOUNTER — Encounter (INDEPENDENT_AMBULATORY_CARE_PROVIDER_SITE_OTHER): Payer: Self-pay

## 2018-11-22 ENCOUNTER — Ambulatory Visit: Payer: Medicare Other | Admitting: Family

## 2019-01-28 ENCOUNTER — Other Ambulatory Visit (INDEPENDENT_AMBULATORY_CARE_PROVIDER_SITE_OTHER): Payer: Self-pay | Admitting: Bariatrics

## 2019-01-28 DIAGNOSIS — E559 Vitamin D deficiency, unspecified: Secondary | ICD-10-CM

## 2019-03-07 ENCOUNTER — Ambulatory Visit (INDEPENDENT_AMBULATORY_CARE_PROVIDER_SITE_OTHER): Payer: Medicare Other

## 2019-03-07 DIAGNOSIS — Z0000 Encounter for general adult medical examination without abnormal findings: Secondary | ICD-10-CM

## 2019-03-07 NOTE — Patient Instructions (Addendum)
  Kathy Bryant , Thank you for taking time to come for your Medicare Wellness Visit. I appreciate your ongoing commitment to your health goals. Please review the following plan we discussed and let me know if I can assist you in the future.   These are the goals we discussed: Goals      Patient Stated   . DIET - INCREASE WATER INTAKE (pt-stated)     Low carb foods    . Increase physical activity (pt-stated)       This is a list of the screening recommended for you and due dates:  Health Maintenance  Topic Date Due  . Complete foot exam   07[birth year]11/1956  . Eye exam for diabetics  07[birth year]11/1956  . DEXA scan (bone density measurement)  07/11/2012  . Urine Protein Check  05/02/2018  . Colon Cancer Screening  03/06/2019  . Hemoglobin A1C  02/16/2019  . Mammogram  11/22/2019  . Tetanus Vaccine  12/01/2023  .  Hepatitis C: One time screening is recommended by Center for Disease Control  (CDC) for  adults born from [birth year] through 1964.   Completed  . Pneumonia vaccines  Completed  . Flu Shot  Discontinued

## 2019-03-07 NOTE — Progress Notes (Signed)
Subjective:   Kathy Bryant is a 73 y.o. female who presents for an Initial Medicare Annual Wellness Visit.  Review of Systems    No ROS.  Medicare Wellness Virtual Visit.  Visual/audio telehealth visit, UTA vital signs.   See social history for additional risk factors.   Cardiac Risk Factors include: advanced age (>64men, >47 women);diabetes mellitus     Objective:    Today's Vitals   There is no height or weight on file to calculate BMI.  Advanced Directives 03/07/2019 12/23/2016 12/22/2016  Does Patient Have a Medical Advance Directive? No No No  Would patient like information on creating a medical advance directive? Yes (MAU/Ambulatory/Procedural Areas - Information given) No - Patient declined -    Current Medications (verified) Outpatient Encounter Medications as of 03/07/2019  Medication Sig   ALPRAZolam (XANAX) 0.25 MG tablet Take 1 tablet (0.25 mg total) by mouth 2 (two) times daily as needed for anxiety.   cyanocobalamin (,VITAMIN B-12,) 1000 MCG/ML injection 1000 mcg (1 mg) injection once per week for four weeks, followed by 1000 mcg injection once per month.   metFORMIN (GLUCOPHAGE XR) 500 MG 24 hr tablet Start 500mg  PO qpm.   neomycin-polymyxin b-dexamethasone (MAXITROL) 3.5-10000-0.1 SUSP Place 1 drop into both eyes 2 (two) times daily as needed (FOR EYE IRRITATION/INFECTION).   Vitamin D, Ergocalciferol, (DRISDOL) 1.25 MG (50000 UT) CAPS capsule Take 1 capsule (50,000 Units total) by mouth every 7 (seven) days.   rosuvastatin (CRESTOR) 10 MG tablet Take 1 tablet (10 mg total) by mouth daily. (Patient not taking: Reported on 03/07/2019)   No facility-administered encounter medications on file as of 03/07/2019.     Allergies (verified) Simvastatin   History: Past Medical History:  Diagnosis Date   Anxiety    Bilateral calf pain    Diabetes mellitus without complication (HCC)    PT STATES SHE WAS ON METFORMIN AND STOPPED TAKING THIS ON HER OWN    Fatty liver    Hepatitis 1960'S   B   Herniated disc, cervical    HTN (hypertension)    PT WAS ON BP MEDS AND THEN STATES HER BP WAS UNDER CONTROL AND SHE STOPPED TAKING BP ON HER OWN   Microscopic hematuria    Obesity    Swelling of both lower extremities    Past Surgical History:  Procedure Laterality Date   APPENDECTOMY  1968   BREAST BIOPSY Right 2013   x2 BENIGN BREAST TISSUE WITH FOCAL HYALINIZED STROMA AND BENIGN BREAST TISSUE WITH FOCAL FIBROADENOMATOUS CHANGE    CESAREAN SECTION  1972 & 1978   CHOLECYSTECTOMY     COLONOSCOPY  03/05/2014   GALLBLADDER SURGERY  1982   HEMORRHOID SURGERY N/A 12/23/2016   Procedure: HEMORRHOIDECTOMY;  Surgeon: Christene Lye, MD;  Location: [facility] ORS;  Service: General;  Laterality: N/A;   HERNIA REPAIR     Abdominal   TOTAL ABDOMINAL HYSTERECTOMY W/ BILATERAL SALPINGOOPHORECTOMY  1998   Family History  Problem Relation Age of Onset   Diabetes Mother    Cancer Mother 98       Brain Tumor - died age 42   Heart disease Father        CAD - died MI - age 63   Hypertension Father    High Cholesterol Father    Diabetes Sister    Diabetes Brother    Diabetes Brother    Cancer Paternal Aunt        Renal Cell Ca   Cancer Paternal  Uncle        Renal Cell Ca   Breast cancer Neg Hx    Social History   Socioeconomic History   Marital status: Married    Spouse name: Not on file   Number of children: 2   Years of education: 13   Highest education level: Not on file  Occupational History   Occupation: Sports administrator: [employer] resource strain: Not hard at all   Food insecurity    Worry: Never true    Inability: Never true   Transportation needs    Medical: No    Non-medical: No  Tobacco Use   Smoking status: Former Smoker    Types: Cigarettes    Quit date: 12/23/2006    Years since quitting: 12.2   Smokeless tobacco: Never Used   Tobacco  comment: "I WAS A CLOSET SMOKER AND NEVER SMOKED ALOT"  Substance and Sexual Activity   Alcohol use: Yes    Comment: Occasional glass of wine   Drug use: No   Sexual activity: Not on file  Lifestyle   Physical activity    Days per week: 0 days    Minutes per session: Not on file   Stress: Not on file  Relationships   Social connections    Talks on phone: Not on file    Gets together: Not on file    Attends religious service: Not on file    Active member of club or organization: Not on file    Attends meetings of clubs or organizations: Not on file    Relationship status: Not on file  Other Topics Concern   Not on file  Social History Narrative   Not on file    Tobacco Counseling Counseling given: Not Answered Comment: "I WAS A CLOSET SMOKER AND NEVER SMOKED ALOT"   Clinical Intake:  Pre-visit preparation completed: Yes        Diabetes: No  How often do you need to have someone help you when you read instructions, pamphlets, or other written materials from your doctor or pharmacy?: 1 - Never  Interpreter Needed?: No      Activities of Daily Living In your present state of health, do you have any difficulty performing the following activities: 03/07/2019  Hearing? N  Vision? N  Difficulty concentrating or making decisions? N  Walking or climbing stairs? N  Dressing or bathing? N  Doing errands, shopping? N  Preparing Food and eating ? N  Using the Toilet? N  In the past six months, have you accidently leaked urine? N  Do you have problems with loss of bowel control? N  Managing your Medications? N  Managing your Finances? N  Housekeeping or managing your Housekeeping? N  Some recent data might be hidden     Immunizations and Health Maintenance Immunization History  Administered Date(s) Administered   Hepb-cpg 08/18/2018, 09/20/2018   Influenza-Unspecified 05/05/2011   Pneumococcal Conjugate-13 11/30/2013   Pneumococcal Polysaccharide-23  05/12/2018   Td 11/30/2013   Tdap 05/05/2011   Zoster 06/27/2012   Health Maintenance Due  Topic Date Due   FOOT EXAM  07/11/1956   OPHTHALMOLOGY EXAM  07[birth year]11/1957   DEXA SCAN  07/11/2012   URINE MICROALBUMIN  05/02/2018   COLONOSCOPY  03/06/2019   HEMOGLOBIN A1C  02/16/2019    Patient Care Team: Burnard Hawthorne, FNP as PCP - General (Family Medicine)  Indicate any recent Medical Services you may  have received from other than [facility] providers in the past year (date may be approximate).     Assessment:   This is a routine wellness examination for Ishika.   I connected with patient 03/07/19 at 12:30 PM EDT by an audio enabled telemedicine application and verified that I am speaking with the correct person using two identifiers. Patient stated full name and DOB. Patient gave permission to continue with virtual visit. Patient's location was at home and Nurse's location was at [place] office.   Patient never started taking Crestor and never completed VitD 50,000 U series. Patient has new concerns surrounding liver diagnosis and weight gain. Follow up with pcp scheduled.    Health Screenings  Mammogram - 05/2018 Colonoscopy - 02/2014 Bone Density - discussed. Ordered by pcp 05/2018 Glaucoma -none Hearing -demonstrates normal hearing during visit. Hemoglobin A1C - 08/2018 (6.2) Cholesterol - 08/2018 Dental- UTD Vision- visits within the last 12 months  Social  Alcohol intake - yes      Smoking history- former  Smokers in home? none Illicit drug use? none Exercise - none. Increased physical activity encouraged.  Diet - regular. Low carb diet encouraged.  Sexually Active -not currently BMI- discussed the importance of a healthy diet, water intake and the benefits of aerobic exercise.  Educational material provided.   Safety  Patient feels safe at home- yes Patient does have smoke detectors at home- yes Patient does wear sunscreen or protective clothing when in  direct sunlight -yes Patient does wear seat belt when in a moving vehicle -yes Patient drives- yes  [id number] precautions and sickness symptoms discussed.   Activities of Daily Living Patient denies needing assistance with: driving, household chores, feeding themselves, getting from bed to chair, getting to the toilet, bathing/showering, dressing, managing money, or preparing meals.  No new identified risk were noted.    Depression Screen Patient denies losing interest in daily life, feeling hopeless, or crying easily over simple problems.   Medication-taking as directed and without issues.   Fall Screen Patient denies being afraid of falling or falling in the last year.   Memory Screen Patient is alert.  Patient denies difficulty focusing, concentrating or misplacing items. Correctly identified the president of the [place], season and recall.  Immunizations The following Immunizations were discussed: Influenza, shingles, pneumonia, and tetanus.   Other Providers Patient Care Team: Burnard Hawthorne, FNP as PCP - General (Family Medicine)   Hearing/Vision screen  Hearing Screening   125Hz  250Hz  500Hz  1000Hz  2000Hz  3000Hz  4000Hz  6000Hz  8000Hz   Right ear:           Left ear:           Comments: Patient is able to hear conversational tones without difficulty.  No issues reported.    Vision Screening Comments: Wears corrective lenses Visual acuity not assessed, virtual visit.  They have seen their ophthalmologist in the last 12 months.     Dietary issues and exercise activities discussed: Current Exercise Habits: The patient does not participate in regular exercise at present  Goals      Patient Stated    DIET - INCREASE WATER INTAKE (pt-stated)     Low carb foods     Increase physical activity (pt-stated)      Depression Screen PHQ 2/9 Scores 03/07/2019 06/12/2018 05/12/2018 05/02/2017 12/15/2016  PHQ - 2 Score 1 3 0 0 0  PHQ- 9 Score - 9 - - -  Exception  Documentation - Medical reason - - -  Fall Risk Fall Risk  03/07/2019 05/02/2017 12/15/2016  Falls in the past year? 0 No No  Is the patient's home free of loose throw rugs in walkways, pet beds, electrical cords, etc?  yes      Grab bars in the bathroom? yes      Handrails on the stairs?  yes      Adequate lighting?  yes  Cognitive Function:     6CIT Screen 03/07/2019  What Year? 0 points  What month? 0 points  What time? 0 points  Count back from 20 0 points  Months in reverse 0 points    Screening Tests Health Maintenance  Topic Date Due   FOOT EXAM  07/11/1956   OPHTHALMOLOGY EXAM  07[birth year]11/1956   DEXA SCAN  07/11/2012   URINE MICROALBUMIN  05/02/2018   COLONOSCOPY  03/06/2019   HEMOGLOBIN A1C  02/16/2019   MAMMOGRAM  11/22/2019   TETANUS/TDAP  12/01/2023   Hepatitis C Screening  Completed   PNA vac Low Risk Adult  Completed   INFLUENZA VACCINE  Discontinued     Plan:    End of life planning; Advance aging; Advanced directives discussed.  Copy of current HCPOA/Living Will requested upon completion.   I have personally reviewed and noted the following in the patients chart:    Medical and social history  Use of alcohol, tobacco or illicit drugs   Current medications and supplements  Functional ability and status  Nutritional status  Physical activity  Advanced directives  List of other physicians  Hospitalizations, surgeries, and ER visits in previous 12 months  Vitals  Screenings to include cognitive, depression, and falls  Referrals and appointments  In addition, I have reviewed and discussed with patient certain preventive protocols, quality metrics, and best practice recommendations. A written personalized care plan for preventive services as well as general preventive health recommendations were provided to patient.     Varney Biles, LPN   [id number]   Agree with plan. [name], NP

## 2019-03-12 ENCOUNTER — Encounter: Payer: Self-pay | Admitting: Family

## 2019-03-12 ENCOUNTER — Ambulatory Visit (INDEPENDENT_AMBULATORY_CARE_PROVIDER_SITE_OTHER): Payer: Medicare Other | Admitting: Family

## 2019-03-12 VITALS — Wt 207.0 lb

## 2019-03-12 DIAGNOSIS — E6609 Other obesity due to excess calories: Secondary | ICD-10-CM

## 2019-03-12 DIAGNOSIS — E785 Hyperlipidemia, unspecified: Secondary | ICD-10-CM

## 2019-03-12 DIAGNOSIS — E1165 Type 2 diabetes mellitus with hyperglycemia: Secondary | ICD-10-CM | POA: Diagnosis not present

## 2019-03-12 DIAGNOSIS — K760 Fatty (change of) liver, not elsewhere classified: Secondary | ICD-10-CM

## 2019-03-12 DIAGNOSIS — F419 Anxiety disorder, unspecified: Secondary | ICD-10-CM

## 2019-03-12 MED ORDER — ROSUVASTATIN CALCIUM 10 MG PO TABS: 10.0000 mg | ORAL_TABLET | Freq: Every day | ORAL | 3 refills | Status: DC

## 2019-03-12 NOTE — Patient Instructions (Addendum)
Start crestor. Labs in 6 weeks.  Please make a follow up with Dr Allen Norris- very important for continued surveillance of cirrhosis EVERY 6 month to monitor for progression, liver cancer. You are also due for colonoscopy.  Phone number: [phone]  Start weight watchers as discussed.   Compression stockings for leg swelling; if doesn't improve , let me know.   Call your pharmacy and ensure your metformin XR has not been recalled. Please also discuss metformin in setting liver disease with Dr Allen Norris.

## 2019-03-12 NOTE — Progress Notes (Signed)
This visit type was conducted due to national recommendations for restrictions regarding the COVID-19 pandemic (e.g. social distancing).  This format is felt to be most appropriate for this patient at this time.  All issues noted in this document were discussed and addressed.  No physical exam was performed (except for noted visual exam findings with Video Visits). Virtual Visit via Video Note  I connected with@  on 03/12/19 at  2:30 PM EDT by a video enabled telemedicine application and verified that I am speaking with the correct person using two identifiers.  Location patient: home Location provider:work  Persons participating in the virtual visit: patient, provider  I discussed the limitations of evaluation and management by telemedicine and the availability of in person appointments. The patient expressed understanding and agreed to proceed.  Interactive audio and video telecommunications were attempted between this provider and patient. I could see patient at first for exam however then we were unable to see each other and we continued with phone.   We continued and completed visit with audio only over the phone.    HPI:  Concern for weight gain and has stopped going to [employer]. Feels 'less active' then ever been. Weight is now 207lbs. Planning to start weight watchers.   She is also concerned for BL leg swelling, worse at end of the day. Sitting for work most of the time. Not bothersome today. Elevation helps.  No sob, cp, calf tenderness. No increased heat, erythema. Both legs are symmetrical.   Cirrhosis/Fatty liver disease- has seen Dr Allen Norris 06/2018; needs surveillance every 6 months  prediabetic- not taking metformin regularly.  HLD- never started crestor.  Vitamin D- still on 50,000 units.   GAD- Feels well. still very sparingly xanax  ROS: See pertinent positives and negatives per HPI.  Past Medical History:  Diagnosis Date  . Anxiety   . Bilateral calf pain    . Diabetes mellitus without complication ([place])    PT STATES SHE WAS ON METFORMIN AND STOPPED TAKING THIS ON HER OWN  . Fatty liver   . 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 Herniated disc, cervical   . HTN (hypertension)    PT WAS ON BP MEDS AND THEN STATES HER BP WAS UNDER CONTROL AND SHE STOPPED TAKING BP ON HER OWN  . Microscopic hematuria   . Obesity   . Swelling of both lower extremities     Past Surgical History:  Procedure Laterality Date  . APPENDECTOMY  1968  . BREAST BIOPSY Right 2013   x2 BENIGN BREAST TISSUE WITH FOCAL HYALINIZED STROMA AND BENIGN BREAST TISSUE WITH FOCAL FIBROADENOMATOUS CHANGE   . [place]  . CHOLECYSTECTOMY    . COLONOSCOPY  03/05/2014  . [place]  . HEMORRHOID SURGERY N/A 12/23/2016   Procedure: HEMORRHOIDECTOMY;  Surgeon: Christene Lye, MD;  Location: [facility] ORS;  Service: General;  Laterality: N/A;  . HERNIA REPAIR     Abdominal  . TOTAL ABDOMINAL HYSTERECTOMY W/ BILATERAL SALPINGOOPHORECTOMY  1998    Family History  Problem Relation Age of Onset  . Diabetes Mother   . Cancer Mother 37       Brain Tumor - died age 78  . Heart disease Father        CAD - died MI - age 77  . Hypertension Father   . High Cholesterol Father   . Diabetes Sister   . Diabetes Brother   . Diabetes Brother   .  Cancer Paternal Aunt        Renal Cell Ca  . Cancer Paternal Uncle        Renal Cell Ca  . Breast cancer Neg Hx     SOCIAL HX: former smoker   Current Outpatient Medications:  .  ALPRAZolam (XANAX) 0.25 MG tablet, Take 1 tablet (0.25 mg total) by mouth 2 (two) times daily as needed for anxiety., Disp: 30 tablet, Rfl: 0 .  metFORMIN (GLUCOPHAGE XR) 500 MG 24 hr tablet, Start 500mg  PO qpm., Disp: 15 tablet, Rfl: 0 .  cyanocobalamin (,VITAMIN B-12,) 1000 MCG/ML injection, 1000 mcg (1 mg) injection once per week for four weeks, followed by 1000 mcg injection once per month. (Patient not taking: Reported on 03/12/2019),  Disp: 1 mL, Rfl: 15 .  neomycin-polymyxin b-dexamethasone (MAXITROL) 3.5-10000-0.1 SUSP, Place 1 drop into both eyes 2 (two) times daily as needed (FOR EYE IRRITATION/INFECTION)., Disp: , Rfl:  .  rosuvastatin (CRESTOR) 10 MG tablet, Take 1 tablet (10 mg total) by mouth daily., Disp: 90 tablet, Rfl: 3  EXAM:  VITALS per patient if applicable: Wt Readings from Last 3 Encounters:  03/12/19 207 lb (93.9 kg)  10/05/18 188 lb (85.3 kg)  09/19/18 190 lb (86.2 kg)    GENERAL: alert, oriented, appears well and in no acute distress  HEENT: atraumatic, conjunttiva clear, no obvious abnormalities on inspection of external nose and ears  NECK: normal movements of the head and neck  LUNGS: on inspection no signs of respiratory distress, breathing rate appears normal, no obvious gross SOB, gasping or wheezing  CV: no obvious cyanosis  MS: moves all visible extremities without noticeable abnormality  PSYCH/NEURO: pleasant and cooperative, no obvious depression or anxiety, speech and thought processing grossly intact  ASSESSMENT AND PLAN:  Discussed the following assessment and plan:  Problem List Items Addressed This Visit      Digestive   Fatty liver disease, nonalcoholic    Established with GI.  Emphasized importance of continued follow-up.  She verbalized understanding.        Endocrine   DM (diabetes mellitus) ([place])    Of note, patient has been informed of metformin recall, she will check with pharmacy      Relevant Medications   rosuvastatin (CRESTOR) 10 MG tablet     Other   HLD (hyperlipidemia) - Primary    Will start crestor; repeat cmp 6 weeks      Relevant Medications   rosuvastatin (CRESTOR) 10 MG tablet   Other Relevant Orders   Comprehensive metabolic panel   Obesity    Patient plans to pursue weight watchers.  Will follow      Anxiety    At baseline, continue regimen.  Using Xanax sparingly. Will continue            I discussed the assessment and  treatment plan with the patient. The patient was provided an opportunity to ask questions and all were answered. The patient agreed with the plan and demonstrated an understanding of the instructions.   The patient was advised to call back or seek an in-person evaluation if the symptoms worsen or if the condition fails to improve as anticipated.   [name], FNP   I spent 25 min non face to face w/ pt.

## 2019-03-16 NOTE — Assessment & Plan Note (Signed)
At baseline, continue regimen.  Using Xanax sparingly. Will continue

## 2019-03-16 NOTE — Assessment & Plan Note (Signed)
Of note, patient has been informed of metformin recall, she will check with pharmacy

## 2019-03-16 NOTE — Assessment & Plan Note (Signed)
Will start crestor; repeat cmp 6 weeks

## 2019-03-16 NOTE — Assessment & Plan Note (Signed)
Established with GI.  Emphasized importance of continued follow-up.  She verbalized understanding.

## 2019-03-16 NOTE — Assessment & Plan Note (Signed)
Patient plans to pursue weight watchers.  Will follow

## 2019-04-09 ENCOUNTER — Ambulatory Visit (INDEPENDENT_AMBULATORY_CARE_PROVIDER_SITE_OTHER): Payer: Medicare Other | Admitting: Gastroenterology

## 2019-04-09 ENCOUNTER — Other Ambulatory Visit: Payer: Self-pay

## 2019-04-09 ENCOUNTER — Encounter: Payer: Self-pay | Admitting: Gastroenterology

## 2019-04-09 VITALS — BP 122/79 | HR 70 | Temp 96.7°F | Ht 63.0 in | Wt 210.6 lb

## 2019-04-09 DIAGNOSIS — K7460 Unspecified cirrhosis of liver: Secondary | ICD-10-CM

## 2019-04-09 DIAGNOSIS — Z86010 Personal history of colonic polyps: Secondary | ICD-10-CM

## 2019-04-09 NOTE — Patient Instructions (Addendum)
You are scheduled for a RUQ abdominal US at [facility] outpatient imaging center, [facility] location on Friday, Sept 11th at 8:45am. Please arrive at 8:30am at the registration desk. You cannot have anything to eat or drink after midnight on Thursday night.   If you need to reschedule this appointment for any reason, please contact central scheduling at [phone].

## 2019-04-09 NOTE — Progress Notes (Signed)
Primary Care Physician: Burnard Hawthorne, FNP  Primary Gastroenterologist:  Dr. Lucilla Lame  Chief Complaint  Patient presents with  . follow up Cirrhosis    6 month follow up    HPI: Kathy Bryant is a 73 y.o. 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  The patient was found to have cirrhosis incidentally on an ultrasound.  The patient was seen by me back in November and she is now here for a follow-up.  The patient's most recent labs: Component     Latest Ref Rng & Units 05/02/2017 05/16/2018 06/15/2018  Total Bilirubin     0.0 - 1.2 mg/dL 0.4 0.4 0.3  BILIRUBIN, DIRECT     0.00 - 0.40 mg/dL   0.10  Alkaline Phosphatase     39 - 117 IU/L 61 64 73  AST     0 - 40 IU/L 17 28 60 (H)  ALT     0 - 32 IU/L 23 40 (H) 67 (H)   The patient was recommended to try and lose weight to decrease the fat in her liver.  The patient has had a history of dyslipidemia with her most recent labs showing with her triglycerides of 166.  The patient's total hepatitis A antibody was positive but she was negative for hepatitis B surface antigen or surface antibody back in November when her labs were checked.  The patient reports doing well now and her only concern is that she had lost some weight on her diet but then gained it back during this pandemic.  Current Outpatient Medications  Medication Sig Dispense Refill  . ALPRAZolam (XANAX) 0.25 MG tablet Take 1 tablet (0.25 mg total) by mouth 2 (two) times daily as needed for anxiety. (Patient not taking: Reported on 04/09/2019) 30 tablet 0  . cyanocobalamin (,VITAMIN B-12,) 1000 MCG/ML injection 1000 mcg (1 mg) injection once per week for four weeks, followed by 1000 mcg injection once per month. (Patient not taking: Reported on 03/12/2019) 1 mL 15  . metFORMIN (GLUCOPHAGE XR) 500 MG 24 hr tablet Start 500mg  PO qpm. (Patient not taking: Reported on 04/09/2019) 15 tablet 0  . neomycin-polymyxin b-dexamethasone (MAXITROL) 3.5-10000-0.1 SUSP Place 1 drop  into both eyes 2 (two) times daily as needed (FOR EYE IRRITATION/INFECTION).    Marland Kitchen rosuvastatin (CRESTOR) 10 MG tablet Take 1 tablet (10 mg total) by mouth daily. (Patient not taking: Reported on 04/09/2019) 90 tablet 3   No current facility-administered medications for this visit.     Allergies as of 04/09/2019 - Review Complete 04/09/2019  Allergen Reaction Noted  . Simvastatin Other (See Comments) 10/25/2011    ROS:  General: Negative for anorexia, weight loss, fever, chills, fatigue, weakness. ENT: Negative for hoarseness, difficulty swallowing , nasal congestion. CV: Negative for chest pain, angina, palpitations, dyspnea on exertion, peripheral edema.  Respiratory: Negative for dyspnea at rest, dyspnea on exertion, cough, sputum, wheezing.  GI: See history of present illness. GU:  Negative for dysuria, hematuria, urinary incontinence, urinary frequency, nocturnal urination.  Endo: Negative for unusual weight change.    Physical Examination:   BP 122/79   Pulse 70   Temp (!) 96.7 F (35.9 C) (Temporal)   Ht 5\' 3"  (1.6 m)   Wt 210 lb 9.6 oz (95.5 kg)   BMI 37.31 kg/m   General: Well-nourished, well-developed in no acute distress.  Eyes: No icterus. Conjunctivae pink. Mouth: Oropharyngeal mucosa moist and pink , no lesions erythema or exudate. Lungs: Clear to auscultation  bilaterally. Non-labored. Heart: Regular rate and rhythm, no murmurs rubs or gallops.  Abdomen: Bowel sounds are normal, nontender, nondistended, no hepatosplenomegaly or masses, no abdominal bruits or hernia , no rebound or guarding.   Extremities: No lower extremity edema. No clubbing or deformities. Neuro: Alert and oriented x 3.  Grossly intact. Skin: Warm and dry, no jaundice.   Psych: Alert and cooperative, normal mood and affect.  Labs:    Imaging Studies: No results found.  Assessment and Plan:   [name] is a 73 y.o. y/o female who has a history of cirrhosis and has not had an  upper endoscopy.  The patient will be set up for a right upper quadrant ultrasound and a blood test for alpha-fetoprotein due to her cirrhosis.  The patient will be set up for an upper endoscopy due to her cirrhosis and to rule out varices.  The patient is also in need of a colonoscopy due to her history of colon polyps.  The patient has been explained the plan and agrees with it.    Lucilla Lame, MD. [name]   Note: This dictation was prepared with Dragon dictation along with smaller phrase technology. Any transcriptional errors that result from this process are unintentional.

## 2019-04-10 ENCOUNTER — Telehealth: Payer: Self-pay

## 2019-04-10 LAB — HEPATIC FUNCTION PANEL
ALT: 47 IU/L — ABNORMAL HIGH (ref 0–32)
AST: 36 IU/L (ref 0–40)
Albumin: 4.1 g/dL (ref 3.7–4.7)
Alkaline Phosphatase: 81 IU/L (ref 39–117)
Bilirubin Total: 0.2 mg/dL (ref 0.0–1.2)
Bilirubin, Direct: 0.09 mg/dL (ref 0.00–0.40)
Total Protein: 6.3 g/dL (ref 6.0–8.5)

## 2019-04-10 LAB — AFP TUMOR MARKER: AFP, Serum, Tumor Marker: 3 ng/mL (ref 0.0–8.3)

## 2019-04-10 NOTE — Telephone Encounter (Signed)
Pt notified of lab results

## 2019-04-10 NOTE — Telephone Encounter (Signed)
-----   Message from Lucilla Lame, MD sent at 04/10/2019  7:51 AM EDT ----- Let the patient know that her liver enzymes have decreased with ALT being normal except 1 of them.  She should continue her weight loss.  The tumor marker for cancer was negative.

## 2019-04-19 ENCOUNTER — Other Ambulatory Visit: Payer: Self-pay

## 2019-04-20 ENCOUNTER — Ambulatory Visit
Admission: RE | Admit: 2019-04-20 | Discharge: 2019-04-20 | Disposition: A | Discharge status: Home / Self Care | Payer: Medicare Other | Source: Ambulatory Visit | Attending: Gastroenterology | Admitting: Gastroenterology

## 2019-04-20 DIAGNOSIS — K7460 Unspecified cirrhosis of liver: Secondary | ICD-10-CM | POA: Diagnosis not present

## 2019-04-20 IMAGING — US US ABDOMEN LIMITED
1 series · 14 of 25 positions shown · non-contrast
Comparison: Abdominal ultrasound [DATE].

CLINICAL DATA: Cirrhosis.  Previous cholecystectomy.

EXAM:
ULTRASOUND ABDOMEN LIMITED RIGHT UPPER QUADRANT

[Series 1: us abdomen limited · 0.30mm/px · 14 of 58 slices shown]
[im 1/58]
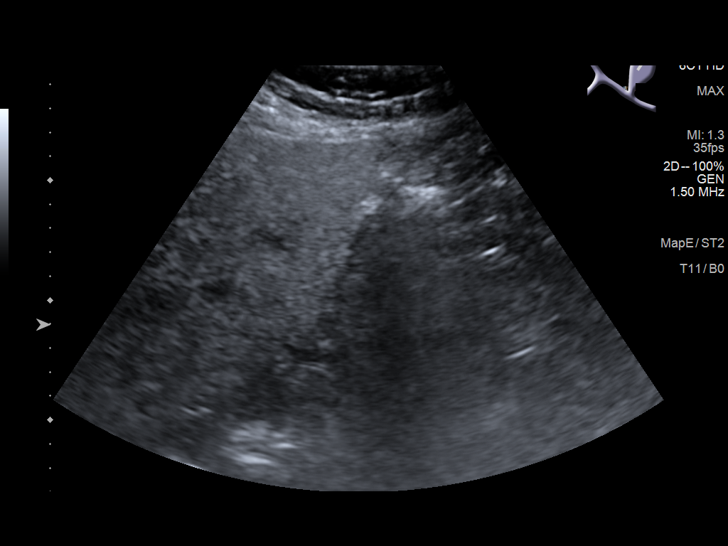
[im 5/58]
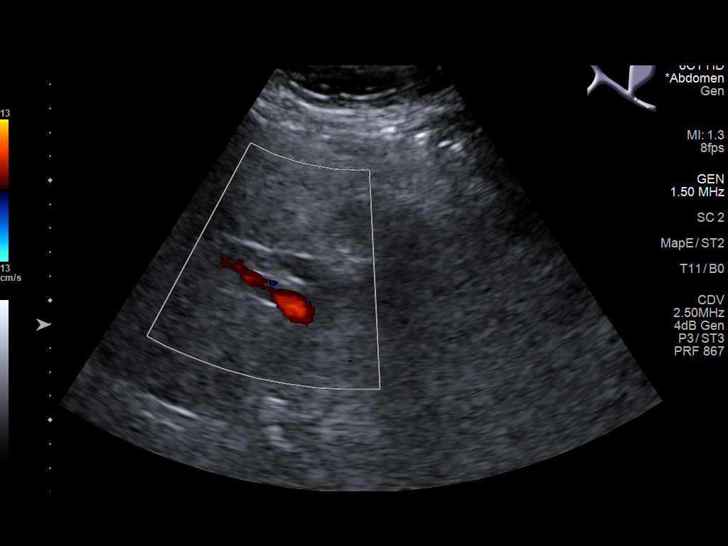
[im 10/58]
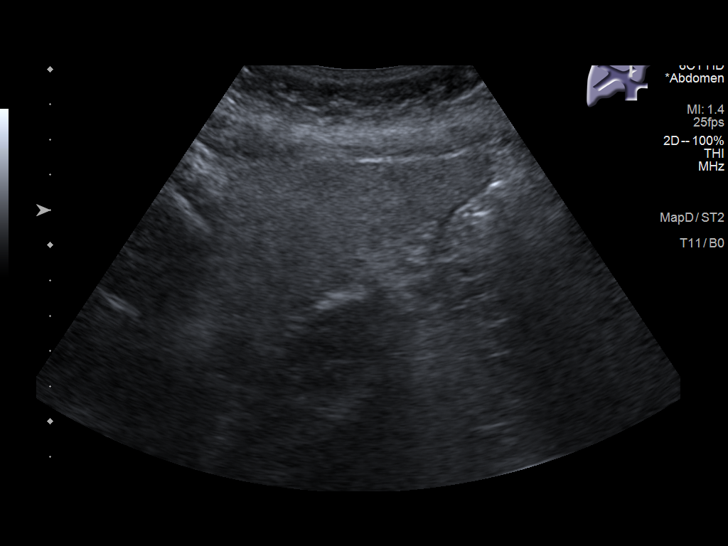
[im 15/58]
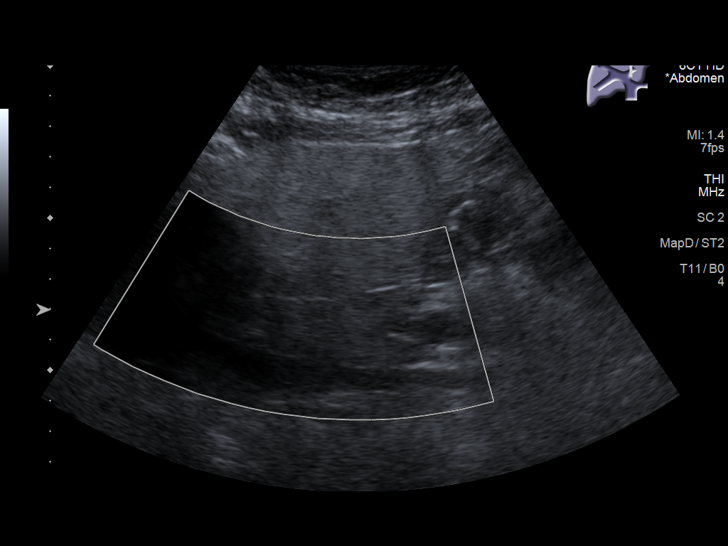
[im 20/58]
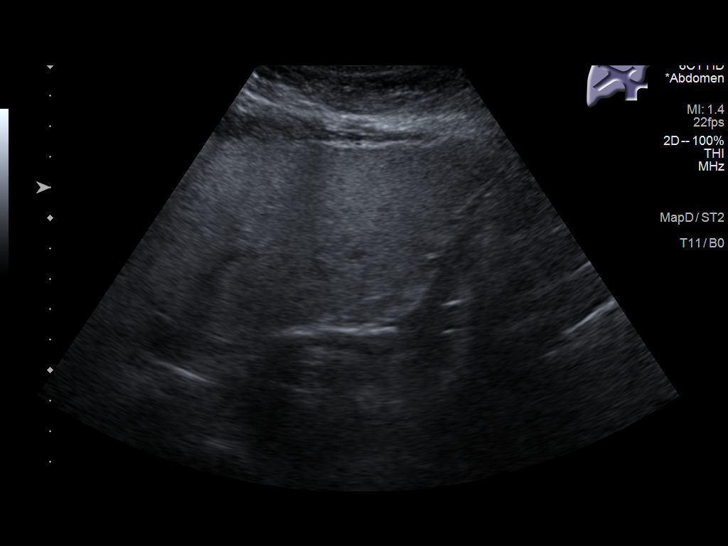
[im 22/58]
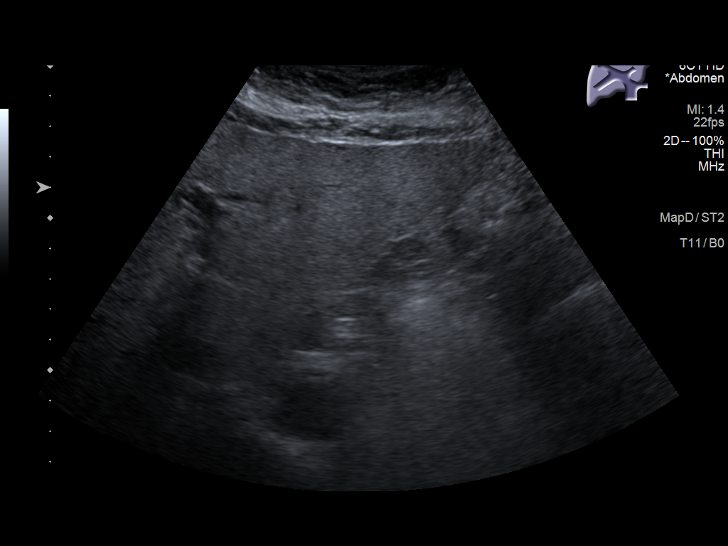
[im 27/58]
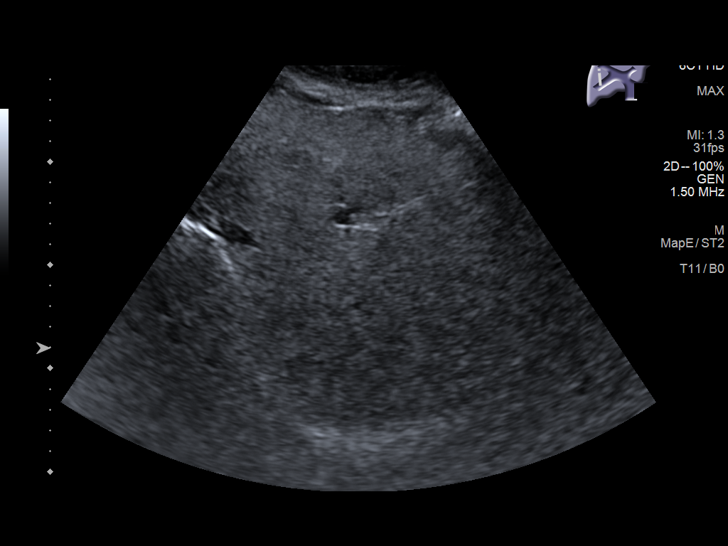
[im 31/58]
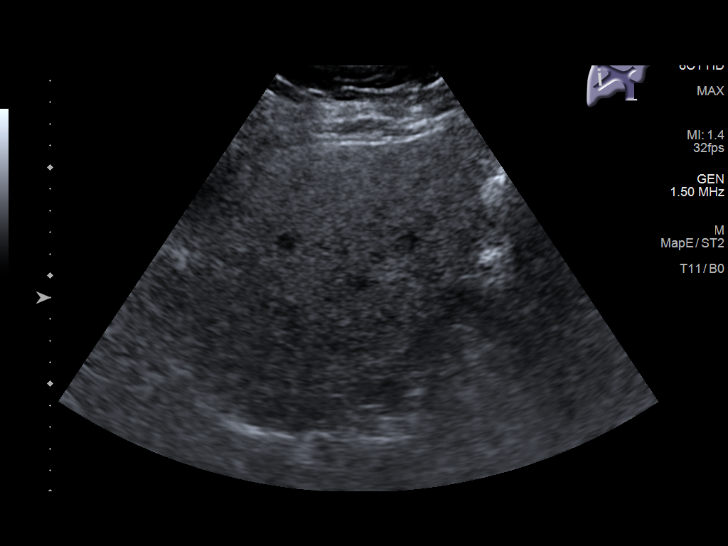
[im 36/58]
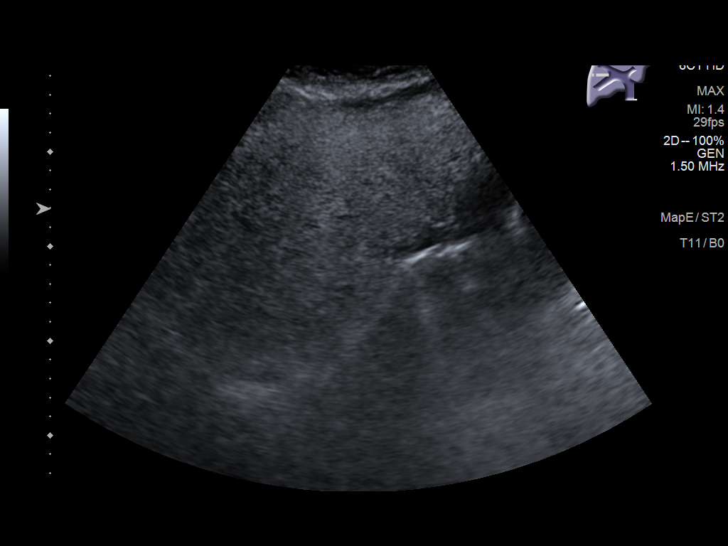
[im 39/58]
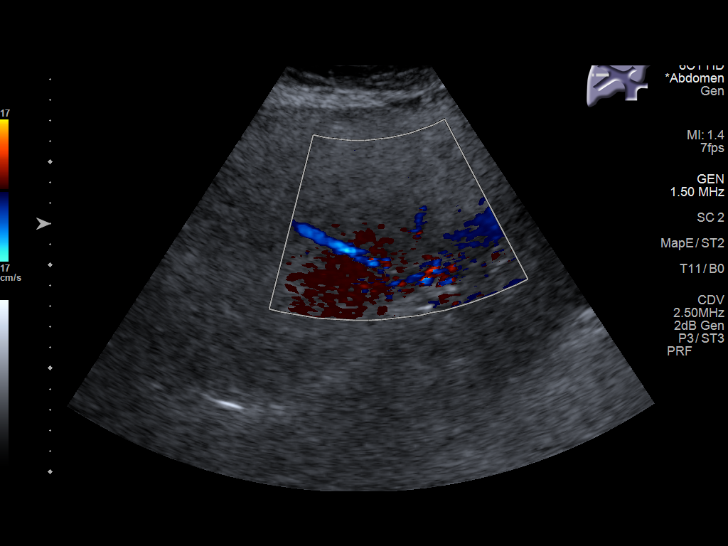
[im 43/58]
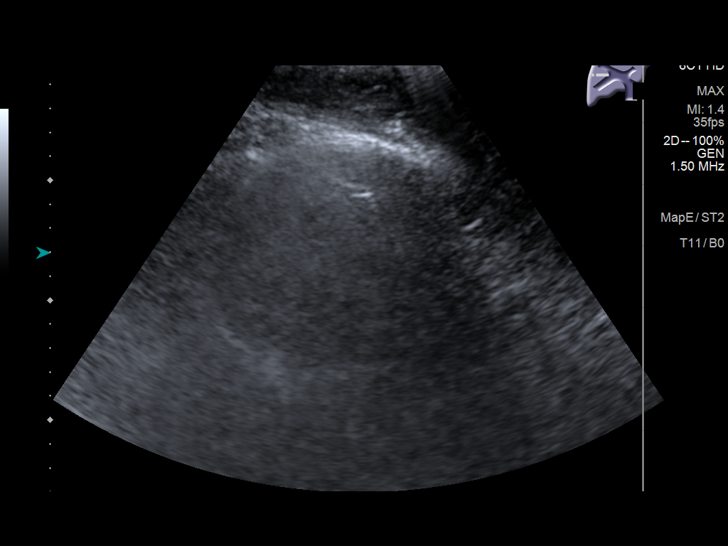
[im 48/58]
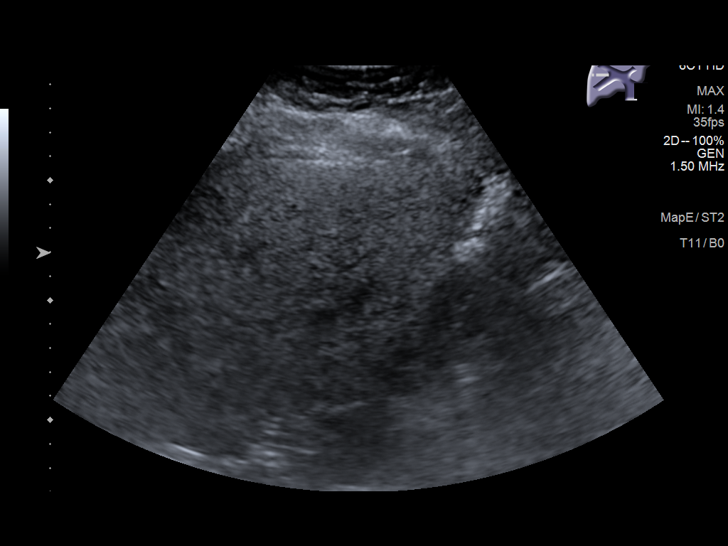
[im 53/58]
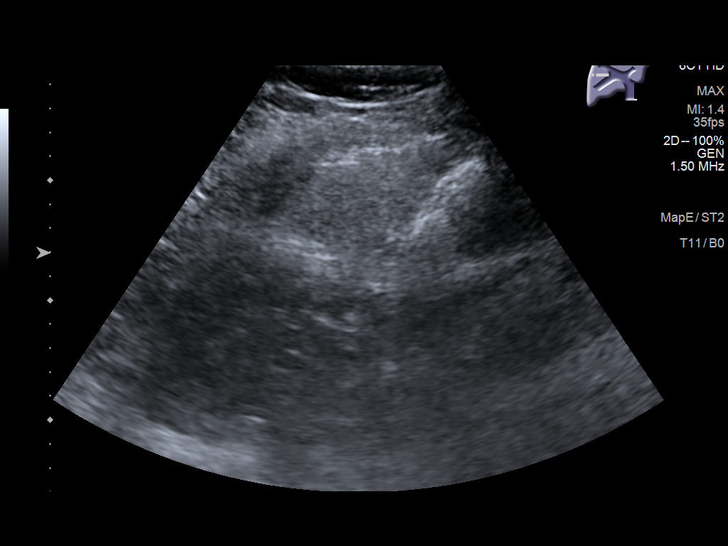
[im 58/58]
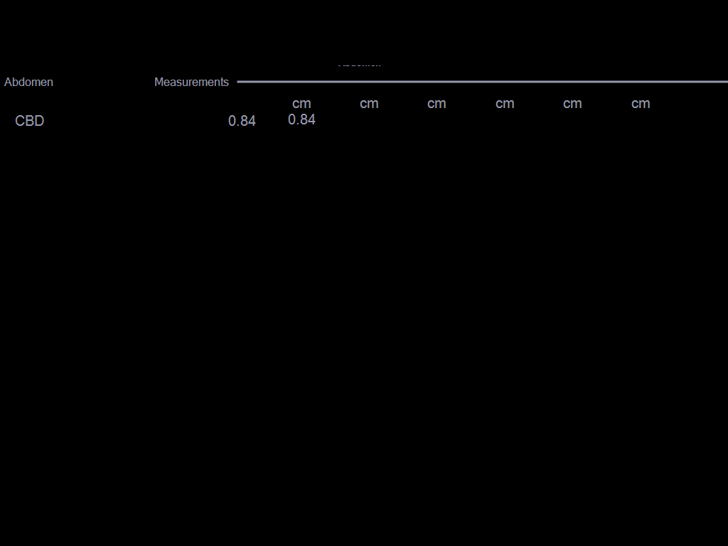

[14 of 25 positions shown; findings below may reference images not displayed]

FINDINGS: Gallbladder:

Surgically absent.

Common bile duct:

Diameter: 8 mm, similar to previous study.

Liver:

The liver is diffusely heterogeneous in echotexture, and there is
mild contour irregularity of the liver. No focal lesions are
identified. Portal vein is patent on color Doppler imaging with
normal direction of blood flow towards the liver.

Other: No demonstrated ascites.
IMPRESSION: 1. Grossly stable appearance of the liver with heterogeneous
echotexture, but no visualized focal abnormality.
2. No significant biliary dilatation post cholecystectomy.

## 2019-04-23 ENCOUNTER — Other Ambulatory Visit: Payer: Self-pay

## 2019-04-23 ENCOUNTER — Other Ambulatory Visit (INDEPENDENT_AMBULATORY_CARE_PROVIDER_SITE_OTHER): Payer: Medicare Other

## 2019-04-23 DIAGNOSIS — E785 Hyperlipidemia, unspecified: Secondary | ICD-10-CM | POA: Diagnosis not present

## 2019-04-23 LAB — COMPREHENSIVE METABOLIC PANEL
ALT: 44 U/L — ABNORMAL HIGH (ref 0–35)
AST: 28 U/L (ref 0–37)
Albumin: 3.9 g/dL (ref 3.5–5.2)
Alkaline Phosphatase: 72 U/L (ref 39–117)
BUN: 14 mg/dL (ref 6–23)
CO2: 28 mEq/L (ref 19–32)
Calcium: 9.2 mg/dL (ref 8.4–10.5)
Chloride: 105 mEq/L (ref 96–112)
Creatinine, Ser: 0.82 mg/dL (ref 0.40–1.20)
GFR: 68.57 mL/min (ref >60.00)
Glucose, Bld: 131 mg/dL — ABNORMAL HIGH (ref 70–99)
Potassium: 4.2 mEq/L (ref 3.5–5.1)
Sodium: 141 mEq/L (ref 135–145)
Total Bilirubin: 0.3 mg/dL (ref 0.2–1.2)
Total Protein: 6.3 g/dL (ref 6.0–8.3)

## 2019-04-24 ENCOUNTER — Other Ambulatory Visit
Admission: RE | Admit: 2019-04-24 | Discharge: 2019-04-24 | Disposition: A | Discharge status: Home / Self Care | Payer: Medicare Other | Source: Ambulatory Visit | Attending: Gastroenterology | Admitting: Gastroenterology

## 2019-04-24 DIAGNOSIS — Z20828 Contact with and (suspected) exposure to other viral communicable diseases: Secondary | ICD-10-CM | POA: Insufficient documentation

## 2019-04-24 DIAGNOSIS — Z01812 Encounter for preprocedural laboratory examination: Secondary | ICD-10-CM | POA: Insufficient documentation

## 2019-04-25 LAB — SARS CORONAVIRUS 2 (TAT 6-24 HRS): SARS Coronavirus 2: NEGATIVE

## 2019-04-26 ENCOUNTER — Telehealth: Payer: Self-pay

## 2019-04-26 NOTE — Telephone Encounter (Signed)
-----   Message from Lucilla Lame, MD sent at 04/23/2019 11:42 AM EDT ----- Let the patient know that her ultrasound did not show any new lesions or worrisome features and is unchanged from her previous imaging.

## 2019-04-26 NOTE — Telephone Encounter (Signed)
Pt notified of US results.  

## 2019-04-27 ENCOUNTER — Other Ambulatory Visit: Payer: Self-pay

## 2019-04-27 ENCOUNTER — Ambulatory Visit
Admission: RE | Admit: 2019-04-27 | Discharge: 2019-04-27 | Disposition: A | Discharge status: Home / Self Care | Payer: Medicare Other | Attending: Gastroenterology | Admitting: Gastroenterology

## 2019-04-27 ENCOUNTER — Ambulatory Visit: Payer: Medicare Other | Admitting: Anesthesiology

## 2019-04-27 ENCOUNTER — Encounter
Admission: RE | Disposition: A | Discharge status: Home / Self Care | Payer: Self-pay | Source: Home / Self Care | Attending: Gastroenterology

## 2019-04-27 DIAGNOSIS — K449 Diaphragmatic hernia without obstruction or gangrene: Secondary | ICD-10-CM | POA: Diagnosis not present

## 2019-04-27 DIAGNOSIS — Z860100 Personal history of colon polyps, unspecified: Secondary | ICD-10-CM

## 2019-04-27 DIAGNOSIS — K7460 Unspecified cirrhosis of liver: Secondary | ICD-10-CM | POA: Diagnosis not present

## 2019-04-27 DIAGNOSIS — E119 Type 2 diabetes mellitus without complications: Secondary | ICD-10-CM | POA: Insufficient documentation

## 2019-04-27 DIAGNOSIS — Z87891 Personal history of nicotine dependence: Secondary | ICD-10-CM | POA: Diagnosis not present

## 2019-04-27 DIAGNOSIS — Z13810 Encounter for screening for upper gastrointestinal disorder: Secondary | ICD-10-CM | POA: Diagnosis not present

## 2019-04-27 DIAGNOSIS — Z1211 Encounter for screening for malignant neoplasm of colon: Secondary | ICD-10-CM | POA: Insufficient documentation

## 2019-04-27 DIAGNOSIS — K640 First degree hemorrhoids: Secondary | ICD-10-CM | POA: Diagnosis not present

## 2019-04-27 DIAGNOSIS — I10 Essential (primary) hypertension: Secondary | ICD-10-CM | POA: Insufficient documentation

## 2019-04-27 DIAGNOSIS — K5730 Diverticulosis of large intestine without perforation or abscess without bleeding: Secondary | ICD-10-CM | POA: Insufficient documentation

## 2019-04-27 DIAGNOSIS — D123 Benign neoplasm of transverse colon: Secondary | ICD-10-CM | POA: Insufficient documentation

## 2019-04-27 DIAGNOSIS — K635 Polyp of colon: Secondary | ICD-10-CM

## 2019-04-27 HISTORY — PX: COLONOSCOPY WITH PROPOFOL: SHX5780

## 2019-04-27 HISTORY — PX: ESOPHAGOGASTRODUODENOSCOPY (EGD) WITH PROPOFOL: SHX5813

## 2019-04-27 HISTORY — PX: POLYPECTOMY: SHX5525

## 2019-04-27 SURGERY — COLONOSCOPY WITH PROPOFOL
Anesthesia: General | Site: Rectum

## 2019-04-27 MED ORDER — LIDOCAINE HCL (CARDIAC) PF 100 MG/5ML IV SOSY
PREFILLED_SYRINGE | INTRAVENOUS | Status: DC | PRN
  Administered 2019-04-27: 30 mg via INTRAVENOUS

## 2019-04-27 MED ORDER — GLYCOPYRROLATE 0.2 MG/ML IJ SOLN
INTRAMUSCULAR | Status: DC | PRN
  Administered 2019-04-27: 0.1 mg via INTRAVENOUS

## 2019-04-27 MED ORDER — LACTATED RINGERS IV SOLN
INTRAVENOUS | Status: DC
  Administered 2019-04-27: 11:00:00 via INTRAVENOUS

## 2019-04-27 MED ORDER — SODIUM CHLORIDE 0.9 % IV SOLN: INTRAVENOUS | Status: DC

## 2019-04-27 MED ORDER — PROPOFOL 10 MG/ML IV BOLUS
INTRAVENOUS | Status: DC | PRN
  Administered 2019-04-27 (×5): 30 mg via INTRAVENOUS
  Administered 2019-04-27 (×2): 20 mg via INTRAVENOUS
  Administered 2019-04-27: 40 mg via INTRAVENOUS
  Administered 2019-04-27 (×2): 30 mg via INTRAVENOUS
  Administered 2019-04-27: 120 mg via INTRAVENOUS

## 2019-04-27 MED ORDER — STERILE WATER FOR IRRIGATION IR SOLN
Status: DC | PRN
  Administered 2019-04-27: 100 mL

## 2019-04-27 SURGICAL SUPPLY — 36 items
BALLN DILATOR 10-12 8 (BALLOONS)
BALLN DILATOR 12-15 8 (BALLOONS)
BALLN DILATOR 15-18 8 (BALLOONS)
BALLN DILATOR CRE 0-12 8 (BALLOONS)
BALLN DILATOR ESOPH 8 10 CRE (MISCELLANEOUS) IMPLANT
BALLOON DILATOR 12-15 8 (BALLOONS) IMPLANT
BALLOON DILATOR 15-18 8 (BALLOONS) IMPLANT
BALLOON DILATOR CRE 0-12 8 (BALLOONS) IMPLANT
BLOCK BITE 60FR ADLT L/F GRN (MISCELLANEOUS) ×3 IMPLANT
CANISTER SUCT 1200ML W/VALVE (MISCELLANEOUS) ×3 IMPLANT
CLIP HMST 235XBRD CATH ROT (MISCELLANEOUS) IMPLANT
CLIP RESOLUTION 360 11X235 (MISCELLANEOUS)
ELECT REM PT RETURN 9FT ADLT (ELECTROSURGICAL)
ELECTRODE REM PT RTRN 9FT ADLT (ELECTROSURGICAL) IMPLANT
FCP ESCP3.2XJMB 240X2.8X (MISCELLANEOUS)
FORCEPS BIOP RAD 4 LRG CAP 4 (CUTTING FORCEPS) IMPLANT
FORCEPS BIOP RJ4 240 W/NDL (MISCELLANEOUS)
FORCEPS ESCP3.2XJMB 240X2.8X (MISCELLANEOUS) IMPLANT
GOWN CVR UNV OPN BCK APRN NK (MISCELLANEOUS) ×4 IMPLANT
GOWN ISOL THUMB LOOP REG UNIV (MISCELLANEOUS) ×2
INJECTOR VARIJECT VIN23 (MISCELLANEOUS) IMPLANT
KIT DEFENDO VALVE AND CONN (KITS) IMPLANT
KIT ENDO PROCEDURE OLY (KITS) ×3 IMPLANT
MARKER SPOT ENDO TATTOO 5ML (MISCELLANEOUS) IMPLANT
PROBE APC STR FIRE (PROBE) IMPLANT
RETRIEVER NET PLAT FOOD (MISCELLANEOUS) IMPLANT
RETRIEVER NET ROTH 2.5X230 LF (MISCELLANEOUS) IMPLANT
SNARE SHORT THROW 13M SML OVAL (MISCELLANEOUS) ×3 IMPLANT
SNARE SHORT THROW 30M LRG OVAL (MISCELLANEOUS) IMPLANT
SNARE SNG USE RND 15MM (INSTRUMENTS) IMPLANT
SPOT EX ENDOSCOPIC TATTOO (MISCELLANEOUS)
SYR INFLATION 60ML (SYRINGE) IMPLANT
TRAP ETRAP POLY (MISCELLANEOUS) ×3 IMPLANT
VARIJECT INJECTOR VIN23 (MISCELLANEOUS)
WATER STERILE IRR 250ML POUR (IV SOLUTION) ×6 IMPLANT
WIRE CRE 18-20MM 8CM F G (MISCELLANEOUS) IMPLANT

## 2019-04-27 NOTE — Op Note (Signed)
[facility] Gastroenterology Patient Name: Kathy Bryant Procedure Date: 04/27/2019 11:51 AM MRN: [id number] Account #: [id number] Date of Birth: July 16, 1946 Admit Type: Outpatient Age: 73 Room: [facility] OR ROOM 01 Gender: Female Note Status: Finalized Procedure:            Upper GI endoscopy Indications:          Cirrhosis rule out esophageal varices Providers:            Lucilla Lame MD, MD Referring MD:         Yvetta Coder. Arnett (Referring MD) Medicines:            Propofol per Anesthesia Complications:        No immediate complications. Procedure:            Pre-Anesthesia Assessment:                       - Prior to the procedure, a History and Physical was                        performed, and patient medications and allergies were                        reviewed. The patient's tolerance of previous                        anesthesia was also reviewed. The risks and benefits of                        the procedure and the sedation options and risks were                        discussed with the patient. All questions were                        answered, and informed consent was obtained. Prior                        Anticoagulants: The patient has taken no previous                        anticoagulant or antiplatelet agents. ASA Grade                        Assessment: II - A patient with mild systemic disease.                        After reviewing the risks and benefits, the patient was                        deemed in satisfactory condition to undergo the                        procedure.                       After obtaining informed consent, the endoscope was                        passed under direct vision. Throughout the procedure,  the patient's blood pressure, pulse, and oxygen                        saturations were monitored continuously. The was                        introduced through the mouth, and advanced to the                 second part of duodenum. The upper GI endoscopy was                        accomplished without difficulty. The patient tolerated                        the procedure well. Findings:      A large hiatal hernia was present.      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      The stomach was normal.      The examined duodenum was normal. Impression:           - Large hiatal hernia.                       - Large paraesophageal hernia.                       - Normal stomach.                       - Normal examined duodenum.                       - No specimens collected. Recommendation:       - Discharge patient to home.                       - Resume previous diet.                       - Continue present medications.                       - Perform a colonoscopy today.                       - Do an upper GI series at appointment to be scheduled. Procedure Code(s):    --- Professional ---                       [phone], Esophagogastroduodenoscopy, flexible, transoral;                        diagnostic, including collection of specimen(s) by                        brushing or washing, when performed (separate procedure) Diagnosis Code(s):    --- Professional ---                       K74.60, Unspecified cirrhosis of liver                       K44.9, Diaphragmatic hernia without obstruction or  gangrene CPT copyright 2019 American Medical Association. All rights reserved. The codes documented in this report are preliminary and upon coder review may  be revised to meet current compliance requirements. Lucilla Lame MD, MD 04/27/2019 12:09:06 PM This report has been signed electronically. Number of Addenda: 0 Note Initiated On: 04/27/2019 11:51 AM Total Procedure Duration: 0 hours 3 minutes 49 seconds  Estimated Blood Loss: Estimated blood loss: none.      [facility]

## 2019-04-27 NOTE — Transfer of Care (Signed)
Immediate Anesthesia Transfer of Care Note  Patient: Kathy Bryant  Procedure(s) Performed: COLONOSCOPY WITH PROPOFOL (N/A Rectum) ESOPHAGOGASTRODUODENOSCOPY (EGD) WITH PROPOFOL (N/A Esophagus) POLYPECTOMY (N/A Rectum)  Patient Location: PACU  Anesthesia Type: General  Level of Consciousness: awake, alert  and patient cooperative  Airway and Oxygen Therapy: Patient Spontanous Breathing and Patient connected to supplemental oxygen  Post-op Assessment: Post-op Vital signs reviewed, Patient's Cardiovascular Status Stable, Respiratory Function Stable, Patent Airway and No signs of Nausea or vomiting  Post-op Vital Signs: Reviewed and stable  Complications: No apparent anesthesia complications

## 2019-04-27 NOTE — Op Note (Signed)
[facility] Gastroenterology Patient Name: Lenayah Lantzer Procedure Date: 04/27/2019 11:51 AM MRN: [id number] Account #: [id number] Date of Birth: 09-09-1945 Admit Type: Outpatient Age: 73 Room: [facility] OR ROOM 01 Gender: Female Note Status: Finalized Procedure:            Colonoscopy Indications:          High risk colon cancer surveillance: Personal history                        of colonic polyps Providers:            Lucilla Lame MD, MD Medicines:            Propofol per Anesthesia Complications:        No immediate complications. Procedure:            Pre-Anesthesia Assessment:                       - Prior to the procedure, a History and Physical was                        performed, and patient medications and allergies were                        reviewed. The patient's tolerance of previous                        anesthesia was also reviewed. The risks and benefits of                        the procedure and the sedation options and risks were                        discussed with the patient. All questions were                        answered, and informed consent was obtained. Prior                        Anticoagulants: The patient has taken no previous                        anticoagulant or antiplatelet agents. ASA Grade                        Assessment: II - A patient with mild systemic disease.                        After reviewing the risks and benefits, the patient was                        deemed in satisfactory condition to undergo the                        procedure.                       After obtaining informed consent, the colonoscope was                        passed under direct vision. Throughout the  procedure,                        the patient's blood pressure, pulse, and oxygen                        saturations were monitored continuously. The                        Colonoscope was introduced through the anus and       advanced to the the cecum, identified by appendiceal                        orifice and ileocecal valve. The colonoscopy was                        performed without difficulty. The patient tolerated the                        procedure well. The quality of the bowel preparation                        was excellent. Findings:      The perianal and digital rectal examinations were normal.      Multiple small-mouthed diverticula were found in the sigmoid colon.      Two sessile polyps were found in the transverse colon. The polyps were 3       to 4 mm in size. These polyps were removed with a cold snare. Resection       and retrieval were complete.      Multiple small-mouthed diverticula were found in the sigmoid colon and       descending colon.      Non-bleeding internal hemorrhoids were found during retroflexion. The       hemorrhoids were Grade I (internal hemorrhoids that do not prolapse). Impression:           - Diverticulosis in the sigmoid colon.                       - Two 3 to 4 mm polyps in the transverse colon, removed                        with a cold snare. Resected and retrieved.                       - Diverticulosis in the sigmoid colon and in the                        descending colon.                       - Non-bleeding internal hemorrhoids. Recommendation:       - Discharge patient to home.                       - Resume previous diet.                       - Continue present medications.                       - Await pathology results.                       -  Repeat colonoscopy in 5 years for surveillance. Procedure Code(s):    --- Professional ---                       [phone], Colonoscopy, flexible; with removal of tumor(s),                        polyp(s), or other lesion(s) by snare technique Diagnosis Code(s):    --- Professional ---                       Z86.010, Personal history of colonic polyps                       K63.5, Polyp of colon CPT copyright  2019 American Medical Association. All rights reserved. The codes documented in this report are preliminary and upon coder review may  be revised to meet current compliance requirements. Lucilla Lame MD, MD 04/27/2019 12:27:29 PM This report has been signed electronically. Number of Addenda: 0 Note Initiated On: 04/27/2019 11:51 AM Scope Withdrawal Time: 0 hours 8 minutes 5 seconds  Total Procedure Duration: 0 hours 15 minutes 23 seconds  Estimated Blood Loss: Estimated blood loss: none.      [facility]

## 2019-04-27 NOTE — Anesthesia Postprocedure Evaluation (Signed)
Anesthesia Post Note  Patient: Kathy Bryant  Procedure(s) Performed: COLONOSCOPY WITH PROPOFOL (N/A Rectum) ESOPHAGOGASTRODUODENOSCOPY (EGD) WITH PROPOFOL (N/A Esophagus) POLYPECTOMY (N/A Rectum)  Patient location during evaluation: PACU Anesthesia Type: General Level of consciousness: awake and alert Pain management: pain level controlled Vital Signs Assessment: post-procedure vital signs reviewed and stable Respiratory status: spontaneous breathing, nonlabored ventilation, respiratory function stable and patient connected to nasal cannula oxygen Cardiovascular status: blood pressure returned to baseline and stable Postop Assessment: no apparent nausea or vomiting Anesthetic complications: no    Adele Barthel Jermel Artley

## 2019-04-27 NOTE — Anesthesia Procedure Notes (Signed)
Date/Time: 04/27/2019 11:58 AM Performed by: Cameron Ali, CRNA Pre-anesthesia Checklist: Patient identified, Emergency Drugs available, Suction available, Timeout performed and Patient being monitored Patient Re-evaluated:Patient Re-evaluated prior to induction Oxygen Delivery Method: Nasal cannula Placement Confirmation: positive ETCO2

## 2019-04-27 NOTE — H&P (Signed)
Kathy Lame, MD [address] [place], [place] [address] [phone] Fax : [phone]  Primary Care Physician:  Burnard Hawthorne, FNP Primary Gastroenterologist:  Dr. Allen Norris  Pre-Procedure History & Physical: HPI:  Kathy Bryant is a 73 y.o. female is here for an endoscopy and colonoscopy.   Past Medical History:  Diagnosis Date  . Anxiety   . Bilateral calf pain   . Diabetes mellitus without complication ([place])    PT STATES SHE WAS ON METFORMIN AND STOPPED TAKING THIS ON HER OWN  . Fatty liver   . 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 Herniated disc, cervical   . HTN (hypertension)    PT WAS ON BP MEDS AND THEN STATES HER BP WAS UNDER CONTROL AND SHE STOPPED TAKING BP ON HER OWN  . Microscopic hematuria   . Obesity   . Swelling of both lower extremities     Past Surgical History:  Procedure Laterality Date  . APPENDECTOMY  1968  . BREAST BIOPSY Right 2013   x2 BENIGN BREAST TISSUE WITH FOCAL HYALINIZED STROMA AND BENIGN BREAST TISSUE WITH FOCAL FIBROADENOMATOUS CHANGE   . [place]  . CHOLECYSTECTOMY    . COLONOSCOPY  03/05/2014  . [place]  . HEMORRHOID SURGERY N/A 12/23/2016   Procedure: HEMORRHOIDECTOMY;  Surgeon: Christene Lye, MD;  Location: [facility];  Service: General;  Laterality: N/A;  . HERNIA REPAIR     Abdominal  . TOTAL ABDOMINAL HYSTERECTOMY W/ BILATERAL SALPINGOOPHORECTOMY  1998    Prior to Admission medications   Medication Sig Start Date End Date Taking? Authorizing Provider  ALPRAZolam (XANAX) 0.25 MG tablet Take 1 tablet (0.25 mg total) by mouth 2 (two) times daily as needed for anxiety. 05/12/18  Yes Arnett, Yvetta Coder, FNP  cyanocobalamin (,VITAMIN B-12,) 1000 MCG/ML injection 1000 mcg (1 mg) injection once per week for four weeks, followed by 1000 mcg injection once per month. 08/18/18  Yes Burnard Hawthorne, FNP  metFORMIN (GLUCOPHAGE XR) 500 MG 24 hr tablet Start 500mg  PO qpm. Patient  not taking: Reported on 04/09/2019 10/25/18   Jearld Lesch A, DO  neomycin-polymyxin b-dexamethasone (MAXITROL) 3.5-10000-0.1 SUSP Place 1 drop into both eyes 2 (two) times daily as needed (FOR EYE IRRITATION/INFECTION).    [provider]  rosuvastatin (CRESTOR) 10 MG tablet Take 1 tablet (10 mg total) by mouth daily. Patient not taking: Reported on 04/09/2019 03/12/19   Burnard Hawthorne, FNP    Allergies as of 04/09/2019 - Review Complete 04/09/2019  Allergen Reaction Noted  . Simvastatin Other (See Comments) 10/25/2011    Family History  Problem Relation Age of Onset  . Diabetes Mother   . Cancer Mother 34       Brain Tumor - died age 52  . Heart disease Father        CAD - died MI - age 92  . Hypertension Father   . High Cholesterol Father   . Diabetes Sister   . Diabetes Brother   . Diabetes Brother   . Cancer Paternal Aunt        Renal Cell Ca  . Cancer Paternal Uncle        Renal Cell Ca  . Breast cancer Neg Hx     Social History   Socioeconomic History  . Marital status: Married    Spouse name: Not on file  . Number of children: 2  . Years of education: 35  .  Highest education level: Not on file  Occupational History  . Occupation: Sports administrator: [employer]   Social Needs  . Financial resource strain: Not hard at all  . Food insecurity    Worry: Never true    Inability: Never true  . Transportation needs    Medical: No    Non-medical: No  Tobacco Use  . Smoking status: Former Smoker    Types: Cigarettes    Quit date: 12/23/2006    Years since quitting: 12.3  . Smokeless tobacco: Never Used  . Tobacco comment: "I WAS A CLOSET SMOKER AND NEVER SMOKED ALOT"  Substance and Sexual Activity  . Alcohol use: Yes    Comment: Occasional glass of wine  . Drug use: No  . Sexual activity: Not on file  Lifestyle  . Physical activity    Days per week: 0 days    Minutes per session: Not on file  . Stress: Not on file  Relationships   . Social Herbalist on phone: Not on file    Gets together: Not on file    Attends religious service: Not on file    Active member of club or organization: Not on file    Attends meetings of clubs or organizations: Not on file    Relationship status: Not on file  . Intimate partner violence    Fear of current or ex partner: No    Emotionally abused: No    Physically abused: No    Forced sexual activity: No  Other Topics Concern  . Not on file  Social History Narrative  . Not on file    Review of Systems: See HPI, otherwise negative ROS  Physical Exam: Ht 5\' 3"  (1.6 m)   Wt 90.7 kg   BMI 35.43 kg/m  General:   Alert,  pleasant and cooperative in NAD Head:  Normocephalic and atraumatic. Neck:  Supple; no masses or thyromegaly. Lungs:  Clear throughout to auscultation.    Heart:  Regular rate and rhythm. Abdomen:  Soft, nontender and nondistended. Normal bowel sounds, without guarding, and without rebound.   Neurologic:  Alert and  oriented x4;  grossly normal neurologically.  Impression/Plan: Kathy Bryant is here for an endoscopy and colonoscopy to be performed for r/o varices and history of colon polyps, adenomatous, 7.28.2015  Risks, benefits, limitations, and alternatives regarding  endoscopy and colonoscopy have been reviewed with the patient.  Questions have been answered.  All parties agreeable.   Kathy Lame, MD  04/27/2019, 10:56 AM

## 2019-04-27 NOTE — Anesthesia Preprocedure Evaluation (Signed)
Anesthesia Evaluation  Patient identified by MRN, date of birth, ID band Patient awake    History of Anesthesia Complications Negative for: history of anesthetic complications  Airway Mallampati: III  TM Distance: >3 FB Neck ROM: Full    Dental no notable dental hx.    Pulmonary former smoker,    Pulmonary exam normal        Cardiovascular Exercise Tolerance: Good hypertension, (-) anginaNormal cardiovascular exam     Neuro/Psych Anxiety    GI/Hepatic negative GI ROS, (+) Cirrhosis  ([facility])      ,   Endo/Other  diabetesobesity  Renal/GU      Musculoskeletal   Abdominal   Peds  Hematology negative hematology ROS (+)   Anesthesia Other Findings   Reproductive/Obstetrics                            Anesthesia Physical Anesthesia Plan  ASA: II  Anesthesia Plan: General   Post-op Pain Management:    Induction:   PONV Risk Score and Plan: 3 and Propofol infusion and TIVA  Airway Management Planned: Natural Airway  Additional Equipment: None  Intra-op Plan:   Post-operative Plan:   Informed Consent: I have reviewed the patients History and Physical, chart, labs and discussed the procedure including the risks, benefits and alternatives for the proposed anesthesia with the patient or authorized representative who has indicated his/her understanding and acceptance.       Plan Discussed with:   Anesthesia Plan Comments:         Anesthesia Quick Evaluation

## 2019-04-30 ENCOUNTER — Encounter: Payer: Self-pay | Admitting: Gastroenterology

## 2019-05-01 ENCOUNTER — Encounter: Payer: Self-pay | Admitting: Gastroenterology

## 2019-05-19 ENCOUNTER — Other Ambulatory Visit: Payer: Self-pay | Admitting: Family

## 2019-05-19 DIAGNOSIS — F419 Anxiety disorder, unspecified: Secondary | ICD-10-CM

## 2019-05-21 NOTE — Telephone Encounter (Signed)
Last OV 03/12/19 last refill 05/12/18

## 2019-05-28 ENCOUNTER — Telehealth: Payer: Self-pay | Admitting: Family Medicine

## 2019-05-28 DIAGNOSIS — Z1231 Encounter for screening mammogram for malignant neoplasm of breast: Secondary | ICD-10-CM

## 2019-05-28 NOTE — Telephone Encounter (Signed)
Please call the patient.  It appears she is due for a mammogram.  There was a reminder in [name] in basket.  We can get her scheduled for this once you speak with her.

## 2019-05-30 NOTE — Addendum Note (Signed)
Addended by: Dorna Leitz on: 05/30/2019 08:58 AM   Modules accepted: Orders

## 2019-06-18 ENCOUNTER — Other Ambulatory Visit: Payer: Self-pay

## 2019-06-18 DIAGNOSIS — Z20822 Contact with and (suspected) exposure to covid-19: Secondary | ICD-10-CM

## 2019-06-19 LAB — NOVEL CORONAVIRUS, NAA: SARS-CoV-2, NAA: NOT DETECTED

## 2019-09-19 ENCOUNTER — Telehealth: Payer: Self-pay | Admitting: Family

## 2019-09-19 NOTE — Telephone Encounter (Signed)
Pt called and stated that she has been having bad chest pains since 09/18/19 and has been nauseated for about a week. I spoke with Amy on the Access Nurse line and transferred her over.

## 2019-09-19 NOTE — Telephone Encounter (Signed)
Noted  

## 2019-09-19 NOTE — Telephone Encounter (Signed)
OTICE: This fax transmission is intended only for the addressee. It contains information that is legally privileged, confidential or otherwise protected from use or disclosure. If you are not the intended recipient, you are strictly prohibited from reviewing, disclosing, copying using or disseminating any of this information or taking any action in reliance on or regarding this information. If you have received this fax in error, please notify us immediately by telephone so that we can arrange for its return to [place]. Phone: [phone], Toll-Free: [phone], Fax: [phone] Page: 1 of 2 Call Id: [id number] [place] RECORD AccessNurse Patient Name: Kathy Bryant Gender: Female DOB: 05-21-1946 Age: 74 Y 2 M 7 D Return Phone Number: [phone] (Primary) Address: City/State/Zip: [place] [address] Client [facility] [place] [facility] - [facility] Client Site [place] - Day Physician [name]- NP Contact Type Call Who Is Calling Patient / Member / Family / Caregiver Call Type Triage / Clinical Relationship To Patient Self Return Phone Number [phone] (Primary) Chief Complaint CHEST PAIN (>=21 years) - pain, pressure, heaviness or tightness Reason for Call Symptomatic / Request for [place] states c/o chest pain and nausea. [place] Not Listed The caller was instructed to go to the ED as her MD's office has no openings. She stated that she will call her daughter who is an Therapist, sports to find out where she wants her to go. Translation No Nurse Assessment Nurse: Zorita Pang, RN, Deborah Date/Time (Eastern Time): 09/19/2019 10:49:17 AM Confirm and document reason for call. If symptomatic, describe symptoms. ---The caller states that she had one horrible pain that went through her chest into her back lasting two or three minutes. She states that  she feels fine this am. She is having nausea on and off for two or three weeks. She has been eating bland food. No difficulty breathing during that episode. Has the patient had close contact with a person known or suspected to have the novel coronavirus illness OR traveled / lives in area with major community spread (including international travel) in the last 14 days from the onset of symptoms? * If Asymptomatic, screen for exposure and travel within the last 14 days. ---No Does the patient have any new or worsening symptoms? ---Yes Will a triage be completed? ---Yes Related visit to physician within the last 2 weeks? ---No Does the PT have any chronic conditions? (i.e. diabetes, asthma, this includes High risk factors for pregnancy, etc.) ---Yes List chronic conditions. ---cirrhosis Is this a behavioral health or substance abuse call? ---No PLEASE NOTE: All timestamps contained within this report are represented as [place] Standard Time. CONFIDENTIALTY NOTICE: This fax transmission is intended only for the addressee. It contains information that is legally privileged, confidential or otherwise protected from use or disclosure. If you are not the intended recipient, you are strictly prohibited from reviewing, disclosing, copying using or disseminating any of this information or taking any action in reliance on or regarding this information. If you have received this fax in error, please notify us immediately by telephone so that we can arrange for its return to [place]. Phone: [phone], Toll-Free: [phone], Fax: [phone] Page: 2 of 2 Call Id: [id number] Guidelines Guideline Title Affirmed Question Affirmed Notes Nurse Date/Time Eilene Ghazi Time) Chest Pain [1] Chest pain lasting < 5 minutes AND [2] NO chest pain or cardiac symptoms (e.g., breathing difficulty, sweating) now (Exception: chest pains that last only a few seconds) Womble, RN,  Neoma Laming 09/19/2019 10:52:37 AM Disp.  Time Eilene Ghazi Time) Disposition Final User 09/19/2019 10:46:01 AM Send to Urgent Rich Brave, Amy 09/19/2019 11:07:26 AM See PCP within 24 Hours Yes Womble, RN, Garrel Ridgel Disagree/Comply Comply Caller Understands Yes PreDisposition Go to ED Care Advice Given Per Guideline SEE PCP WITHIN 24 HOURS: * You become worse.

## 2019-09-19 NOTE — Telephone Encounter (Signed)
Spoke with pt She one episode of central breast pain which radiated to back. started yesterday at work.  Lasted for several minutes. Got up and stretched and walked down the hall , resolved after several minutes. Had nausea prior to episode. Has bout of nausea for 2 weeks prior to this. Felt SOB during the severity of pain.    Had eaten a couple of hours prior to episode. Had chicken noodle soup, tea.   No h/o gerd.   During episode no numbness or tingling radiating to left arm or jaw, palpitations, dizziness, frequent headaches, changes in vision,.  Doesn't think 'I am having heart attack.'  Discussed risks of missing an acute MI; she was agreeable go to [facility], declines 911 transport or going to ed   Would like referral to [facility] once sure not having acute event; I have agreed with this plan.

## 2019-09-19 NOTE — Telephone Encounter (Signed)
Call pt I dont see where she in ED for CP. Please ensure she is there

## 2019-09-19 NOTE — Telephone Encounter (Signed)
Per note going to ER.

## 2019-09-19 NOTE — Telephone Encounter (Signed)
Talked with Access Nurse wanted patient to be sen within 24 hours advised no appointments available and with severe chest pain with nausea advised ER disposition for possible cardiac work UP.

## 2019-09-19 NOTE — Telephone Encounter (Signed)
I spoke with patient & she refused ED. She said that she would go if she has any further pain. She said the only pain she had was yesterday afternoon & it was an awful pain in between her breast that felt like it was going through to her back. She denies any numbness/tingling/pain in left arm or jaw, no vision changes, no SOB, no current chest pain. She said that she has just felt tired the last month & thought that was due to weight gain until her chest pain yesterday. She wanted to be checked out by you but I told her that we are not equipped for symptoms she is experiencing. She was again urged to go to ED. Patient asked could you just refer her to cardiology Dr. Rockey Situ because her husband goes there & knows her.

## 2019-09-23 NOTE — Telephone Encounter (Signed)
Circle back with pt Did she ever go to urgent care?  Has she had any more episodes of chest pain?

## 2019-09-24 NOTE — Telephone Encounter (Signed)
Pt stated that she did not ever go anywhere to be checked out. She said that she promised [place] as well as her husband that she would go be checked out at least at an UC if she has even the slightest bit of pain. She said the only things that bothers her from time to time is her nausea. I told her that I remembered you saying that this could be an overlooked symptom in women. Patient stated that she was feeling good & did agree to follow-up with you on March 10th.

## 2019-09-24 NOTE — Telephone Encounter (Signed)
noted 

## 2019-10-05 ENCOUNTER — Ambulatory Visit: Payer: Medicare PPO | Attending: Internal Medicine

## 2019-10-05 DIAGNOSIS — Z23 Encounter for immunization: Secondary | ICD-10-CM | POA: Insufficient documentation

## 2019-10-05 NOTE — Progress Notes (Signed)
   Covid-19 Vaccination Clinic  Name:  MCKINLIE SWEEZER    MRN: [id number] DOB: 10/09/1945  10/05/2019  Ms. Wiedeman was observed post Covid-19 immunization for 15 minutes without incidence. She was provided with Vaccine Information Sheet and instruction to access the V-Safe system.   Ms. Truby was instructed to call 911 with any severe reactions post vaccine: Marland Kitchen Difficulty breathing  . Swelling of your face and throat  . A fast heartbeat  . A bad rash all over your body  . Dizziness and weakness    Immunizations Administered    Name Date Dose VIS Date Route   Pfizer COVID-19 Vaccine 10/05/2019 12:10 PM 0.3 mL 07/20/2019 Intramuscular   Manufacturer: [employer]   Lot: [id number]   [place]: [id number]

## 2019-10-23 ENCOUNTER — Ambulatory Visit: Payer: Medicare PPO | Admitting: Family

## 2019-10-23 ENCOUNTER — Other Ambulatory Visit
Admission: RE | Admit: 2019-10-23 | Discharge: 2019-10-23 | Disposition: A | Discharge status: Home / Self Care | Payer: Medicare PPO | Source: Ambulatory Visit | Attending: Cardiovascular Disease | Admitting: Cardiovascular Disease

## 2019-10-23 ENCOUNTER — Encounter: Payer: Self-pay | Admitting: Cardiovascular Disease

## 2019-10-23 ENCOUNTER — Encounter: Payer: Self-pay | Admitting: Family

## 2019-10-23 ENCOUNTER — Telehealth: Payer: Self-pay | Admitting: Cardiovascular Disease

## 2019-10-23 ENCOUNTER — Other Ambulatory Visit: Payer: Self-pay

## 2019-10-23 ENCOUNTER — Ambulatory Visit (INDEPENDENT_AMBULATORY_CARE_PROVIDER_SITE_OTHER): Payer: Medicare PPO | Admitting: Cardiovascular Disease

## 2019-10-23 VITALS — BP 140/80 | HR 88 | Ht 63.5 in | Wt 211.4 lb

## 2019-10-23 DIAGNOSIS — R079 Chest pain, unspecified: Secondary | ICD-10-CM

## 2019-10-23 DIAGNOSIS — R0789 Other chest pain: Secondary | ICD-10-CM | POA: Insufficient documentation

## 2019-10-23 DIAGNOSIS — E782 Mixed hyperlipidemia: Secondary | ICD-10-CM | POA: Insufficient documentation

## 2019-10-23 DIAGNOSIS — R110 Nausea: Secondary | ICD-10-CM

## 2019-10-23 DIAGNOSIS — Z79899 Other long term (current) drug therapy: Secondary | ICD-10-CM | POA: Diagnosis not present

## 2019-10-23 DIAGNOSIS — R7309 Other abnormal glucose: Secondary | ICD-10-CM

## 2019-10-23 DIAGNOSIS — Z87891 Personal history of nicotine dependence: Secondary | ICD-10-CM | POA: Diagnosis not present

## 2019-10-23 DIAGNOSIS — F17200 Nicotine dependence, unspecified, uncomplicated: Secondary | ICD-10-CM | POA: Diagnosis not present

## 2019-10-23 LAB — LIPID PANEL
Cholesterol: 212 mg/dL — ABNORMAL HIGH (ref 0–200)
HDL: 43 mg/dL (ref >40)
LDL Cholesterol: 126 mg/dL — ABNORMAL HIGH (ref 0–99)
Total CHOL/HDL Ratio: 4.9 RATIO
Triglycerides: 216 mg/dL — ABNORMAL HIGH (ref <150)
VLDL: 43 mg/dL — ABNORMAL HIGH (ref 0–40)

## 2019-10-23 LAB — HEMOGLOBIN A1C
Hgb A1c MFr Bld: 7.8 % — ABNORMAL HIGH (ref 4.8–5.6)
Mean Plasma Glucose: 177.16 mg/dL

## 2019-10-23 LAB — TROPONIN I (HIGH SENSITIVITY): Troponin I (High Sensitivity): 3 ng/L (ref <18)

## 2019-10-23 NOTE — Progress Notes (Signed)
Subjective:    Patient ID: Kathy Bryant, female    DOB: 1946-02-19, 74 y.o.   MRN: [id number]  CC: POLA YUN is a 74 y.o. female who presents today for follow up.   HPI: Follow up chest pain  Sitting at work yesterday and had episode of chest tightness and heaviness at noon. Hadnt had lunch yet. Took 2 aspirins.  Episode lasted for a couple of minutes.   During episode, denies  numbness or tingling radiating to left arm or jaw, palpitations, dizziness, frequent headaches, changes in vision, or shortness of breath.   First episode 8 weeks ago, had nausea with similar chest tightness. Stretched arms out as thought the pain was related to bra. Walked down hall with arms out without resolve.   Started smoking again, 3 months ago, and then stopped smoking,  Nausea improved.  Nausea has 'sporadic'. Felt well in the mornings and then around lunch time would feel nauseated. Would skip lunch.Cannot tell if eating makes nausea better or worse.  H/o hiatal hernia.  No excessive burping, bloating, epigastric burning, weight loss.  H/o stomach 'stapled' - occasional non bloody vomiting if eat too fast, too much volume. No trouble swallowing, choking.   H/o cirrhosis      UTD , EGD- Dr Allen Norris cholecystectomy, appendectomy, hysterectomy.   HISTORY:  Past Medical History:  Diagnosis Date  . Anxiety   . Bilateral calf pain   . Diabetes mellitus without complication ([place])    PT STATES SHE WAS ON METFORMIN AND STOPPED TAKING THIS ON HER OWN  . Fatty liver   . 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 Herniated disc, cervical   . HTN (hypertension)    PT WAS ON BP MEDS AND THEN STATES HER BP WAS UNDER CONTROL AND SHE STOPPED TAKING BP ON HER OWN  . Microscopic hematuria   . Obesity   . Swelling of both lower extremities    Past Surgical History:  Procedure Laterality Date  . APPENDECTOMY  1968  . BREAST BIOPSY Right 2013   x2 BENIGN BREAST TISSUE WITH FOCAL HYALINIZED STROMA AND BENIGN  BREAST TISSUE WITH FOCAL FIBROADENOMATOUS CHANGE   . [place]  . CHOLECYSTECTOMY    . COLONOSCOPY  03/05/2014  . COLONOSCOPY WITH PROPOFOL N/A 04/27/2019   Procedure: COLONOSCOPY WITH PROPOFOL;  Surgeon: Lucilla Lame, MD;  Location: [place];  Service: Endoscopy;  Laterality: N/A;  . ESOPHAGOGASTRODUODENOSCOPY (EGD) WITH PROPOFOL N/A 04/27/2019   Procedure: ESOPHAGOGASTRODUODENOSCOPY (EGD) WITH PROPOFOL;  Surgeon: Lucilla Lame, MD;  Location: [place];  Service: Endoscopy;  Laterality: N/A;  . [place]  . GASTRIC RESTRICTION SURGERY  1980   'stomach stapled'  . HEMORRHOID SURGERY N/A 12/23/2016   Procedure: HEMORRHOIDECTOMY;  Surgeon: Christene Lye, MD;  Location: [facility];  Service: General;  Laterality: N/A;  . HERNIA REPAIR     Abdominal  . POLYPECTOMY N/A 04/27/2019   Procedure: POLYPECTOMY;  Surgeon: Lucilla Lame, MD;  Location: [place];  Service: Endoscopy;  Laterality: N/A;  . TOTAL ABDOMINAL HYSTERECTOMY W/ BILATERAL SALPINGOOPHORECTOMY  1998   Family History  Problem Relation Age of Onset  . Diabetes Mother   . Cancer Mother 61       Brain Tumor - died age 30  . Heart disease Father        CAD - died MI - age 54  . Hypertension Father   . High Cholesterol Father   .  Diabetes Sister   . Diabetes Brother   . Diabetes Brother   . Cancer Paternal Aunt        Renal Cell Ca  . Cancer Paternal Uncle        Renal Cell Ca  . Breast cancer Neg Hx     Allergies: Simvastatin Current Outpatient Medications on File Prior to Visit  Medication Sig Dispense Refill  . rosuvastatin (CRESTOR) 10 MG tablet Take 1 tablet (10 mg total) by mouth daily. 90 tablet 3  . ALPRAZolam (XANAX) 0.25 MG tablet Take 1 tablet by mouth twice daily as needed for anxiety 15 tablet 0   No current facility-administered medications on file prior to visit.    Social History   Tobacco Use  . Smoking status: Former Smoker     Types: Cigarettes    Quit date: 12/23/2006    Years since quitting: 12.8  . Smokeless tobacco: Never Used  . Tobacco comment: "I WAS A CLOSET SMOKER AND NEVER SMOKED ALOT"  Substance Use Topics  . Alcohol use: Yes    Comment: Occasional glass of wine  . Drug use: No    Review of Systems  Constitutional: Negative for chills and fever.  Respiratory: Positive for chest tightness. Negative for cough and shortness of breath.   Cardiovascular: Positive for chest pain. Negative for palpitations and leg swelling.  Gastrointestinal: Positive for nausea. Negative for abdominal distention, abdominal pain and vomiting.  Musculoskeletal: Negative for arthralgias.      Objective:    BP 118/68   Pulse 78   Temp (!) 96.6 F (35.9 C) (Temporal)   Ht 5' 3.5" (1.613 m)   Wt 210 lb 12.8 oz (95.6 kg)   SpO2 96%   BMI 36.76 kg/m  BP Readings from Last 3 Encounters:  10/23/19 140/80  10/23/19 118/68  04/27/19 126/64   Wt Readings from Last 3 Encounters:  10/23/19 211 lb 6 oz (95.9 kg)  10/23/19 210 lb 12.8 oz (95.6 kg)  04/27/19 207 lb (93.9 kg)    Physical Exam Vitals reviewed.  Constitutional:      Appearance: She is well-developed.  Eyes:     Conjunctiva/sclera: Conjunctivae normal.  Cardiovascular:     Rate and Rhythm: Normal rate and regular rhythm.     Pulses: Normal pulses.     Heart sounds: Normal heart sounds.  Pulmonary:     Effort: Pulmonary effort is normal.     Breath sounds: Normal breath sounds. No wheezing, rhonchi or rales.  Chest:     Chest wall: No mass or tenderness.     Comments: No reproducible chest wall pain Skin:    General: Skin is warm and dry.  Neurological:     Mental Status: She is alert.  Psychiatric:        Speech: Speech normal.        Behavior: Behavior normal.        Thought Content: Thought content normal.        Assessment & Plan:   Problem List Items Addressed This Visit      Other   Chest tightness    Episode yesterday, episode  prior to 6 to 8 weeks ago.  Both episodes occurred while still.  Discussed with patient my significant concern for risk factors for coronary event based on age, smoking.  She has appointment with Dr. Rockey Situ today. EKG reviewed by me and shows sinus rhythm.  When compared to prior EKG in 2019, no significant changes  Nausea    Etiology of her discomfort is nonspecific at this time.  We jointly agreed that we would focus on acute symptom of chest pain at this time.  Discussed differentials for nausea including psoriasis, history of bariatric surgery, GERD, peptic ulcer disease.  Patient will follow up to discuss this further.  I also strongly concerned that she is to follow-up with Dr. Allen Norris in regards to her history of cirrhosis.  Patient states she will make this appointment          I am having [place]. [place] maintain her rosuvastatin and ALPRAZolam.   No orders of the defined types were placed in this encounter.   Return precautions given.   Risks, benefits, and alternatives of the medications and treatment plan prescribed today were discussed, and patient expressed understanding.   Education regarding symptom management and diagnosis given to patient on AVS.  Continue to follow with Burnard Hawthorne, FNP for routine health maintenance.   Kathy Bryant and I agreed with plan.   [name], FNP

## 2019-10-23 NOTE — Telephone Encounter (Signed)
Patient calling  Has seen lab results on mychart but would like to discuss with nurse Please call

## 2019-10-23 NOTE — Telephone Encounter (Signed)
Lab calling with results  Transferred to [place]

## 2019-10-23 NOTE — Assessment & Plan Note (Addendum)
Episode yesterday, episode prior to 6 to 8 weeks ago.  Both episodes occurred while still.  Discussed with patient my significant concern for risk factors for coronary event based on age, smoking.  She has appointment with Dr. Rockey Situ today. EKG reviewed by me and shows sinus rhythm.  When compared to prior EKG in 2019, no significant changes

## 2019-10-23 NOTE — Patient Instructions (Addendum)
Call Dr Dorothey Baseman TODAY and schedule your follow up. [phone].  DR Rockey Situ  At 1140 today.   Located in: [facility] Address: [address], [place], [place] [address] Hours:  Open ? Closes 5PM Phone: [phone]  Stay safe!

## 2019-10-23 NOTE — Telephone Encounter (Signed)
Spoke with patient and reviewed preliminary finding of the troponin. Advised her cholesterol numbers were abnormal and she asked if she should start taking her medication. Advised that would be a good idea and that once all other results are back we will be calling her to review. She verbalized understanding with no further questions.

## 2019-10-23 NOTE — Assessment & Plan Note (Signed)
Etiology of her discomfort is nonspecific at this time.  We jointly agreed that we would focus on acute symptom of chest pain at this time.  Discussed differentials for nausea including psoriasis, history of bariatric surgery, GERD, peptic ulcer disease.  Patient will follow up to discuss this further.  I also strongly concerned that she is to follow-up with Dr. Allen Norris in regards to her history of cirrhosis.  Patient states she will make this appointment

## 2019-10-23 NOTE — Patient Instructions (Addendum)
Medication Instructions:  No changes  If you need a refill on your cardiac medications before your next appointment, please call your pharmacy.    Lab work: lipids and HBA1c with Troponin today in [facility]   If you have labs (blood work) drawn today and your tests are completely normal, you will receive your results only by: Marland Kitchen MyChart Message (if you have MyChart) OR . A paper copy in the mail If you have any lab test that is abnormal or we need to change your treatment, we will call you to review the results.   Testing/Procedures: Myoview on Thursday for chest pain/angina  [place]  Your caregiver has ordered a Stress Test with nuclear imaging. The purpose of this test is to evaluate the blood supply to your heart muscle. This procedure is referred to as a "Non-Invasive Stress Test." This is because other than having an IV started in your vein, nothing is inserted or "invades" your body. Cardiac stress tests are done to find areas of poor blood flow to the heart by determining the extent of coronary artery disease (CAD). Some patients exercise on a treadmill, which naturally increases the blood flow to your heart, while others who are  unable to walk on a treadmill due to physical limitations have a pharmacologic/chemical stress agent called Lexiscan . This medicine will mimic walking on a treadmill by temporarily increasing your coronary blood flow.   Please note: these test may take anywhere between 2-4 hours to complete  PLEASE REPORT TO [facility] TO GO  Date of Procedure: Thursday March 18th Arrival Time for Procedure: 09:15 AM  Instructions regarding medication:   _XX___ : Hold diabetes medication morning of procedure   PLEASE NOTIFY THE OFFICE AT LEAST 24 HOURS IN ADVANCE IF YOU ARE UNABLE TO KEEP YOUR APPOINTMENT.  [phone] AND  PLEASE NOTIFY NUCLEAR MEDICINE AT [facility] AT LEAST 24 HOURS IN  ADVANCE IF YOU ARE UNABLE TO KEEP YOUR APPOINTMENT. [phone]  How to prepare for your Myoview test:  1. Do not eat or drink after midnight 2. No caffeine for 24 hours prior to test 3. No smoking 24 hours prior to test. 4. Your medication may be taken with water.  If your doctor stopped a medication because of this test, do not take that medication. 5. Ladies, please do not wear dresses.  Skirts or pants are appropriate. Please wear a short sleeve shirt. 6. No perfume, cologne or lotion. 7. Wear comfortable walking shoes. No heels!   Follow-Up: At [facility], you and your health needs are our priority.  As part of our continuing mission to provide you with exceptional heart care, we have created designated Provider Care Teams.  These Care Teams include your primary Cardiologist (physician) and Advanced Practice Providers (APPs -  Physician Assistants and Nurse Practitioners) who all work together to provide you with the care you need, when you need it.  You will need a follow up appointment as needed  . Providers on your designated Care Team:   . Murray Hodgkins, NP . Christell Faith, PA-C . Marrianne Mood, PA-C  Any Other Special Instructions Will Be Listed Below (If Applicable).  For educational health videos Log in to : [email or web address] Or : [email or web address], password : [email or web address]

## 2019-10-23 NOTE — Progress Notes (Signed)
I called patient to let her know that I had made her an appointment with Dr. Allen Norris for 12/11/19 @ 2:15p.

## 2019-10-23 NOTE — Telephone Encounter (Signed)
Incoming call from lab. Pt troponin WNL (3).   Routing to provider to make aware.   No further questions at this time.

## 2019-10-23 NOTE — Progress Notes (Signed)
Cardiology Office Note  Date:  10/23/2019   ID:  Arthella, Vernet [birth year], MRN [id number]  PCP:  Burnard Hawthorne, FNP   Chief Complaint  Patient presents with  . New patient    Referred by PCP for chest pain and Swelling in ankles. Meds reviewed verbally wtih patient.     HPI:  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 mild glucose intolerance, HBA1C 6.2 Hyperlipidemia presents by referral from [name] chest pain  No regular exercise program Usual state of health until recently when she has started developing episodes of chest pain  Was seen by primary care Reported having chest pain while sitting at work She had not had lunch, developed severe chest pain, took 2 aspirins, episode lasted 3 to 4 minutes Tried to move around, move her arms to make pain go away Eventually resolved on its own  Additional episode of Left arm pain last week, throbbing At rest  Denies having any discomfort with exertion Yesterday, chest felt tight Continues even into today a little bit  Lab work from last year reviewed HAB1C 6.2 Total cholesterol greater than 200  EKG personally reviewed by myself on todays visit Shows NSR rate 62 no ST or T  Wave changes  Dad died 51 of MI Mother: Died of brain tumor   PMH:   has a past medical history of Anxiety, Bilateral calf pain, Diabetes mellitus without complication ([place]), Fatty liver, Hepatitis (1960'S), Herniated disc, cervical, HTN (hypertension), Microscopic hematuria, Obesity, and Swelling of both lower extremities.  PSH:    Past Surgical History:  Procedure Laterality Date  . APPENDECTOMY  1968  . BREAST BIOPSY Right 2013   x2 BENIGN BREAST TISSUE WITH FOCAL HYALINIZED STROMA AND BENIGN BREAST TISSUE WITH FOCAL FIBROADENOMATOUS CHANGE   . [place]  . CHOLECYSTECTOMY    . COLONOSCOPY  03/05/2014  . COLONOSCOPY WITH PROPOFOL N/A  04/27/2019   Procedure: COLONOSCOPY WITH PROPOFOL;  Surgeon: Lucilla Lame, MD;  Location: [place];  Service: Endoscopy;  Laterality: N/A;  . ESOPHAGOGASTRODUODENOSCOPY (EGD) WITH PROPOFOL N/A 04/27/2019   Procedure: ESOPHAGOGASTRODUODENOSCOPY (EGD) WITH PROPOFOL;  Surgeon: Lucilla Lame, MD;  Location: [place];  Service: Endoscopy;  Laterality: N/A;  . [place]  . GASTRIC RESTRICTION SURGERY  1980   'stomach stapled'  . HEMORRHOID SURGERY N/A 12/23/2016   Procedure: HEMORRHOIDECTOMY;  Surgeon: Christene Lye, MD;  Location: [facility];  Service: General;  Laterality: N/A;  . HERNIA REPAIR     Abdominal  . POLYPECTOMY N/A 04/27/2019   Procedure: POLYPECTOMY;  Surgeon: Lucilla Lame, MD;  Location: [place];  Service: Endoscopy;  Laterality: N/A;  . TOTAL ABDOMINAL HYSTERECTOMY W/ BILATERAL SALPINGOOPHORECTOMY  1998    Current Outpatient Medications  Medication Sig Dispense Refill  . ALPRAZolam (XANAX) 0.25 MG tablet Take 1 tablet by mouth twice daily as needed for anxiety 15 tablet 0  . rosuvastatin (CRESTOR) 10 MG tablet Take 1 tablet (10 mg total) by mouth daily. 90 tablet 3   No current facility-administered medications for this visit.    Allergies:   Simvastatin   Social History:  The patient  reports that she quit smoking about 12 years ago. Her smoking use included cigarettes. She has never used smokeless tobacco. She reports current alcohol use. She reports that she does not use drugs.   Family History:   family history includes Cancer  in her paternal aunt and paternal uncle; Cancer (age of onset: 66) in her mother; Diabetes in her brother, brother, mother, and sister; Heart disease in her father; High Cholesterol in her father; Hypertension in her father.    Review of Systems: Review of Systems  Constitutional: Negative.   HENT: Negative.   Respiratory: Negative.   Cardiovascular: Positive for chest pain.   Gastrointestinal: Negative.   Musculoskeletal: Negative.   Neurological: Negative.   Psychiatric/Behavioral: Negative.   All other systems reviewed and are negative.    PHYSICAL EXAM: VS:  BP 140/80 (BP Location: Right Arm, Patient Position: Sitting, Cuff Size: Normal)   Pulse 88   Ht 5' 3.5" (1.613 m)   Wt 211 lb 6 oz (95.9 kg)   SpO2 97%   BMI 36.86 kg/m  , BMI Body mass index is 36.86 kg/m. GEN: Well nourished, well developed, in no acute distress HEENT: normal Neck: no JVD, carotid bruits, or masses Cardiac: RRR; no murmurs, rubs, or gallops,no edema  Respiratory:  clear to auscultation bilaterally, normal work of breathing GI: soft, nontender, nondistended, + BS MS: no deformity or atrophy Skin: warm and dry, no rash Neuro:  Strength and sensation are intact Psych: euthymic mood, full affect   Recent Labs: 04/23/2019: ALT 44; BUN 14; Creatinine, Ser 0.82; Potassium 4.2; Sodium 141    Lipid Panel Lab Results  Component Value Date   CHOL 208 (H) 08/18/2018   HDL 47.30 08/18/2018   LDLCALC 127 (H) 08/18/2018   TRIG 166.0 (H) 08/18/2018      Wt Readings from Last 3 Encounters:  10/23/19 211 lb 6 oz (95.9 kg)  10/23/19 210 lb 12.8 oz (95.6 kg)  04/27/19 207 lb (93.9 kg)       ASSESSMENT AND PLAN:  Problem List Items Addressed This Visit      Cardiology Problems   HLD (hyperlipidemia)    Other Visit Diagnoses    Chest pain, unspecified type    -  Primary   Relevant Orders   Troponin I   Lipid panel   HgB A1c   NM Myocar Multi W/Spect W/Wall Motion / EF   Smoker       Elevated glucose         Chest pain etiology unclear Some atypical features given presenting at rest, not associated with exertion EKG benign Clinical exam essentially benign She does have several risk factors for coronary disease including smoking, hyperlipidemia, elevated glucose, discussed with her -Recommended pharmacologic Myoview given we would like to expedite  work-up -Given some mild discomfort today, recommended troponin today  Hyperlipidemia Given borderline diabetes, goal LDL less than 100 If stress test shows coronary calcification on attenuation corrected images would push for LDL of 70 or less Repeat lipid panel ordered today  Elevated glucose/borderline diabetes Suggested weight loss program, lifestyle modification, walking program We have ordered A1c   Disposition:   F/U as needed   Total encounter time more than 60 minutes  Greater than 50% was spent in counseling and coordination of care with the patient    Signed, Esmond Plants, M.D., Ph.D. [place], [place]

## 2019-10-25 ENCOUNTER — Other Ambulatory Visit: Payer: Self-pay

## 2019-10-25 ENCOUNTER — Encounter
Admission: RE | Admit: 2019-10-25 | Discharge: 2019-10-25 | Disposition: A | Discharge status: Home / Self Care | Payer: Medicare PPO | Source: Ambulatory Visit | Attending: Cardiovascular Disease | Admitting: Cardiovascular Disease

## 2019-10-25 DIAGNOSIS — R079 Chest pain, unspecified: Secondary | ICD-10-CM | POA: Insufficient documentation

## 2019-10-25 LAB — NM MYOCAR MULTI W/SPECT W/WALL MOTION / EF
Estimated workload: 1 METS
Exercise duration (min): 0 min
Exercise duration (sec): 0 s
LV dias vol: 42 mL (ref 46–106)
LV sys vol: 17 mL
MPHR: 148 {beats}/min
Peak HR: 97 {beats}/min
Percent HR: 65 %
Rest HR: 61 {beats}/min
SDS: 3
SRS: 3
SSS: 6
TID: 1.31

## 2019-10-25 MED ORDER — REGADENOSON 0.4 MG/5ML IV SOLN
0.4000 mg | Freq: Once | INTRAVENOUS | Status: AC
  Administered 2019-10-25: 0.4 mg via INTRAVENOUS

## 2019-10-25 MED ORDER — TECHNETIUM TC 99M TETROFOSMIN IV KIT
10.0000 | PACK | Freq: Once | INTRAVENOUS | Status: AC | PRN
  Administered 2019-10-25: 09:00:00 10.19 via INTRAVENOUS

## 2019-10-25 MED ORDER — TECHNETIUM TC 99M TETROFOSMIN IV KIT
30.9200 | PACK | Freq: Once | INTRAVENOUS | Status: AC | PRN
  Administered 2019-10-25: 11:00:00 30.92 via INTRAVENOUS

## 2019-10-31 ENCOUNTER — Ambulatory Visit: Payer: Medicare PPO | Attending: Internal Medicine

## 2019-10-31 DIAGNOSIS — Z23 Encounter for immunization: Secondary | ICD-10-CM

## 2019-10-31 NOTE — Progress Notes (Signed)
   Covid-19 Vaccination Clinic  Name:  CHLORA SONGCO    MRN: [id number] DOB: May 28, 1946  10/31/2019  Ms. Everett was observed post Covid-19 immunization for 15 minutes without incident. She was provided with Vaccine Information Sheet and instruction to access the V-Safe system.   Ms. Wesely was instructed to call 911 with any severe reactions post vaccine: Marland Kitchen Difficulty breathing  . Swelling of face and throat  . A fast heartbeat  . A bad rash all over body  . Dizziness and weakness   Immunizations Administered    Name Date Dose VIS Date Route   Pfizer COVID-19 Vaccine 10/31/2019  4:16 PM 0.3 mL 07/20/2019 Intramuscular   Manufacturer: [employer], [employer]   Lot: [id number]   [place]: [id number]

## 2019-11-23 ENCOUNTER — Encounter: Payer: Self-pay | Admitting: Family

## 2019-11-23 ENCOUNTER — Encounter: Payer: Medicare PPO | Admitting: Family

## 2019-11-23 ENCOUNTER — Other Ambulatory Visit: Payer: Self-pay

## 2019-11-23 ENCOUNTER — Telehealth: Payer: Self-pay | Admitting: Family

## 2019-11-23 DIAGNOSIS — F419 Anxiety disorder, unspecified: Secondary | ICD-10-CM

## 2019-11-23 MED ORDER — ALPRAZOLAM 0.25 MG PO TABS: 0.2500 mg | ORAL_TABLET | Freq: Two times a day (BID) | ORAL | 0 refills | Status: DC | PRN

## 2019-11-23 NOTE — Telephone Encounter (Signed)
Patient came in for follow up however had to cancel due to family emergency. She will reschedule appointment  She asked when leaving for a refill of her xanax  I looked up patient on [place] Controlled Substances Reporting System PMP AWARE and saw no activity that raised concern of inappropriate use.

## 2019-11-26 NOTE — Progress Notes (Signed)
This encounter was created in error - please disregard.

## 2019-12-11 ENCOUNTER — Ambulatory Visit (INDEPENDENT_AMBULATORY_CARE_PROVIDER_SITE_OTHER): Payer: Medicare PPO | Admitting: Gastroenterology

## 2019-12-11 ENCOUNTER — Encounter: Payer: Self-pay | Admitting: Gastroenterology

## 2019-12-11 ENCOUNTER — Other Ambulatory Visit: Payer: Self-pay

## 2019-12-11 VITALS — BP 117/70 | HR 90 | Temp 97.9°F | Ht 63.5 in | Wt 207.2 lb

## 2019-12-11 DIAGNOSIS — K7460 Unspecified cirrhosis of liver: Secondary | ICD-10-CM | POA: Diagnosis not present

## 2019-12-11 NOTE — Progress Notes (Signed)
Primary Care Physician: Burnard Hawthorne, FNP  Primary Gastroenterologist:  Dr. Lucilla Lame  Chief Complaint  Patient presents with  . Follow up cirrhosis    HPI: Kathy Bryant is a 74 y.o. female here for follow-up of cirrhosis.  The patient reports that she has been having leg pain and she thinks it started around the time she started Crestor.  The patient says she has pain in her calf that usually start at night.  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  The patient is also due for her surveillance for hepatocellular carcinoma.  Past Medical History:  Diagnosis Date  . Anxiety   . Bilateral calf pain   . Diabetes mellitus without complication ([place])    PT STATES SHE WAS ON METFORMIN AND STOPPED TAKING THIS ON HER OWN  . Fatty liver   . 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 Herniated disc, cervical   . HTN (hypertension)    PT WAS ON BP MEDS AND THEN STATES HER BP WAS UNDER CONTROL AND SHE STOPPED TAKING BP ON HER OWN  . Microscopic hematuria   . Obesity   . Swelling of both lower extremities     Current Outpatient Medications  Medication Sig Dispense Refill  . ALPRAZolam (XANAX) 0.25 MG tablet Take 1 tablet (0.25 mg total) by mouth 2 (two) times daily as needed. for anxiety 15 tablet 0  . rosuvastatin (CRESTOR) 10 MG tablet Take 1 tablet (10 mg total) by mouth daily. 90 tablet 3   No current facility-administered medications for this visit.    Allergies as of 12/11/2019 - Review Complete 12/11/2019  Allergen Reaction Noted  . Simvastatin Other (See Comments) 10/25/2011    ROS:  General: Negative for anorexia, weight loss, fever, chills, fatigue, weakness. ENT: Negative for hoarseness, difficulty swallowing , nasal congestion. CV: Negative for chest pain, angina, palpitations, dyspnea on  exertion, peripheral edema.  Respiratory: Negative for dyspnea at rest, dyspnea on exertion, cough, sputum, wheezing.  GI: See history of present illness. GU:  Negative for dysuria, hematuria, urinary incontinence, urinary frequency, nocturnal urination.  Endo: Negative for unusual weight change.    Physical Examination:   BP 117/70   Pulse 90   Temp 97.9 F (36.6 C) (Oral)   Ht 5' 3.5" (1.613 m)   Wt 207 lb 3.2 oz (94 kg)   BMI 36.13 kg/m   General: Well-nourished, well-developed in no acute distress.  Eyes: No icterus. Conjunctivae pink. Lungs: Clear to auscultation bilaterally. Non-labored. Heart: Regular rate and rhythm, no murmurs rubs or gallops.  Abdomen: Bowel sounds are normal, nontender, nondistended, no hepatosplenomegaly or masses, no abdominal bruits or hernia , no rebound or guarding.   Extremities: No lower extremity edema. No clubbing or deformities. Neuro: Alert and oriented x 3.  Grossly intact. Skin: Warm and dry, no jaundice.   Psych: Alert and cooperative, normal mood and affect.  Labs:    Imaging Studies: No results found.  Assessment and Plan:   Kathy Bryant is a 74 y.o. y/o female who comes in today with lower extremity pain which may be related to her Crestor.  The patient has been told that she can try to stop it if the pain goes away and if it does not she should contact her  primary care provider.  The patient has also been told that she will need to be set up for a right upper quadrant ultrasound to rule out hepatocellular carcinoma.  She will also be set up for alpha-fetoprotein and repeat liver enzymes.  The patient states that she had lost some weight but has gained it back through the pandemic.  The patient's upper endoscopy did not show any sign of varices and she is not due for another repeat upper endoscopy for 2 years. The patient has been explained the plan and agrees with it.     Lucilla Lame, MD. [name]    Note: This dictation was  prepared with Dragon dictation along with smaller phrase technology. Any transcriptional errors that result from this process are unintentional.

## 2019-12-12 LAB — AFP TUMOR MARKER: AFP, Serum, Tumor Marker: 3.8 ng/mL (ref 0.0–8.3)

## 2019-12-12 LAB — HEPATIC FUNCTION PANEL
ALT: 62 IU/L — ABNORMAL HIGH (ref 0–32)
AST: 68 IU/L — ABNORMAL HIGH (ref 0–40)
Albumin: 4 g/dL (ref 3.7–4.7)
Alkaline Phosphatase: 93 IU/L (ref 39–117)
Bilirubin Total: 0.2 mg/dL (ref 0.0–1.2)
Bilirubin, Direct: 0.1 mg/dL (ref 0.00–0.40)
Total Protein: 6.2 g/dL (ref 6.0–8.5)

## 2019-12-13 ENCOUNTER — Other Ambulatory Visit: Payer: Self-pay

## 2019-12-13 ENCOUNTER — Ambulatory Visit
Admission: RE | Admit: 2019-12-13 | Discharge: 2019-12-13 | Disposition: A | Discharge status: Home / Self Care | Payer: Medicare PPO | Source: Ambulatory Visit | Attending: Gastroenterology | Admitting: Gastroenterology

## 2019-12-13 DIAGNOSIS — K449 Diaphragmatic hernia without obstruction or gangrene: Secondary | ICD-10-CM | POA: Diagnosis not present

## 2019-12-13 DIAGNOSIS — K7460 Unspecified cirrhosis of liver: Secondary | ICD-10-CM | POA: Diagnosis not present

## 2019-12-13 DIAGNOSIS — K760 Fatty (change of) liver, not elsewhere classified: Secondary | ICD-10-CM | POA: Diagnosis not present

## 2019-12-13 IMAGING — US US ABDOMEN LIMITED
1 series · 14 of 25 positions shown · non-contrast
Comparison: [DATE]

CLINICAL DATA: Cirrhosis.  Prior cholecystectomy.

EXAM:
ULTRASOUND ABDOMEN LIMITED RIGHT UPPER QUADRANT

[Series 1: us abdomen limited · 0.26mm/px · 14 of 53 slices shown]
[im 1/53]
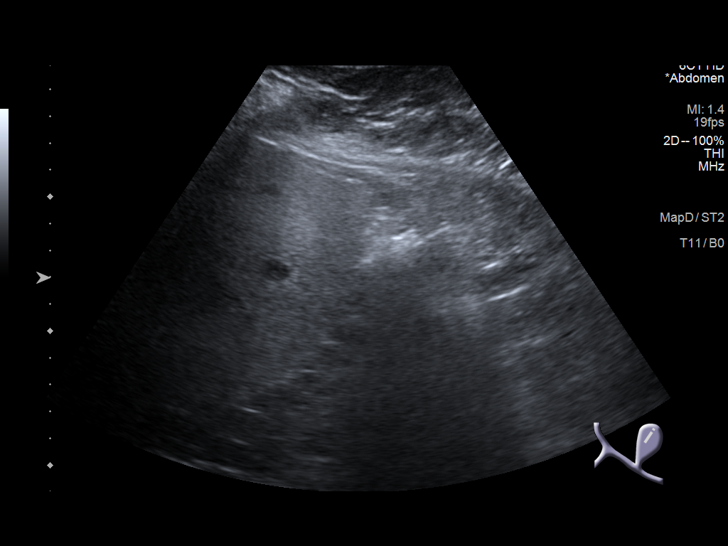
[im 5/53]
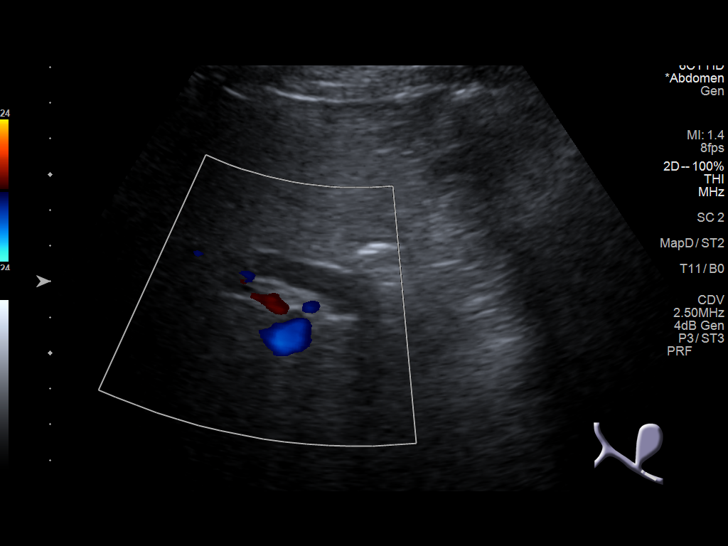
[im 9/53]
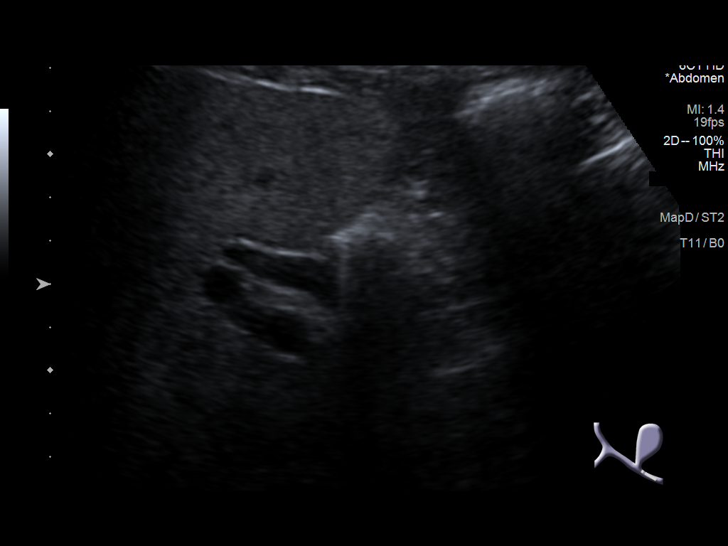
[im 14/53]
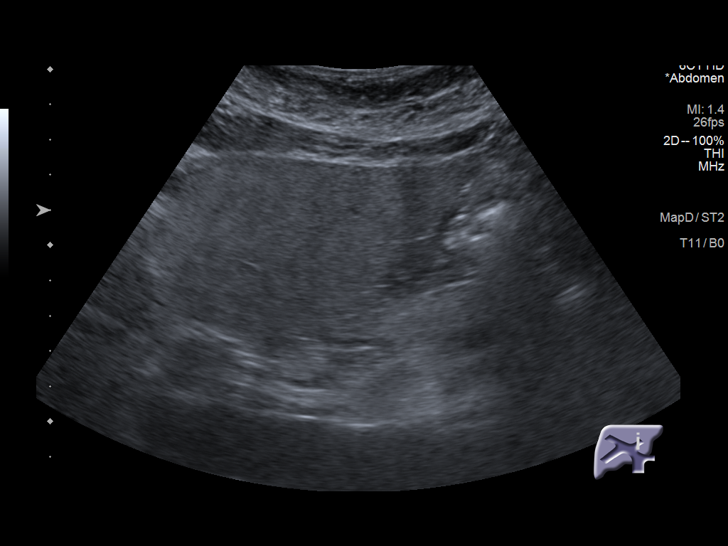
[im 18/53]
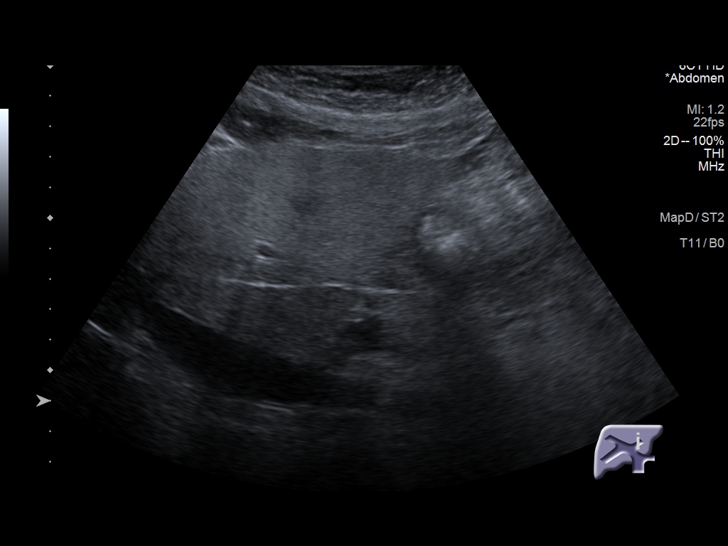
[im 20/53]
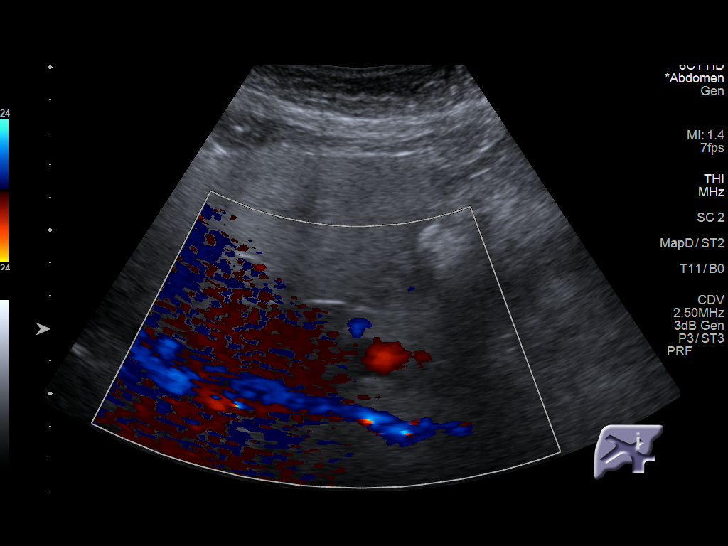
[im 24/53]
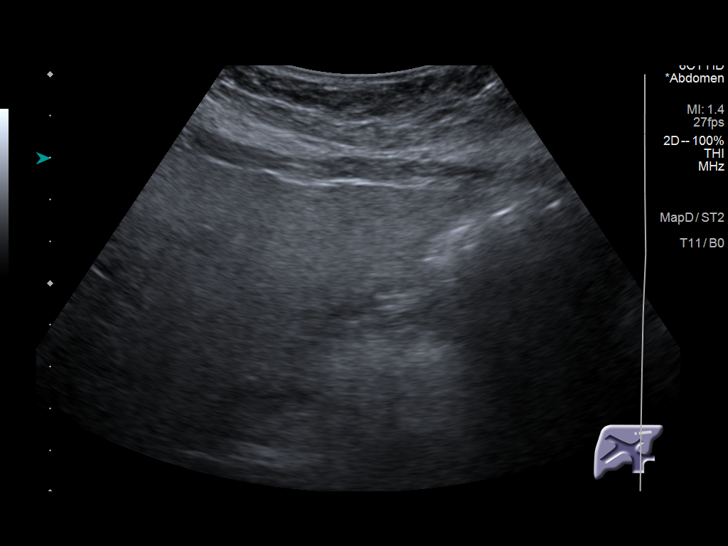
[im 29/53]
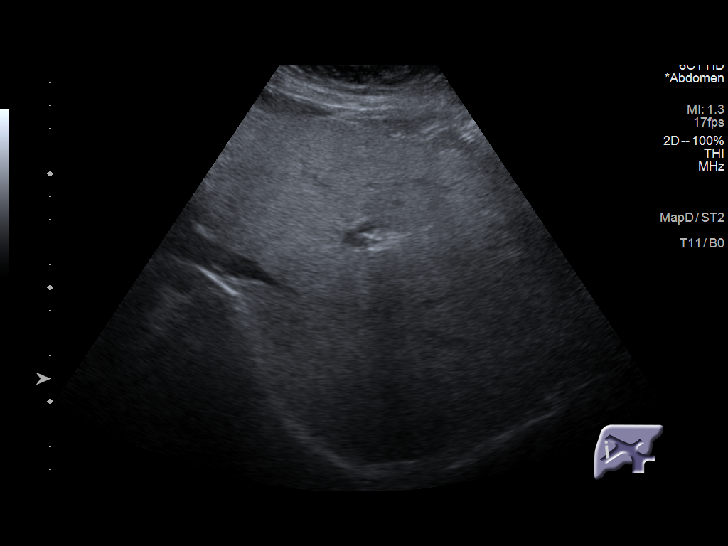
[im 33/53]
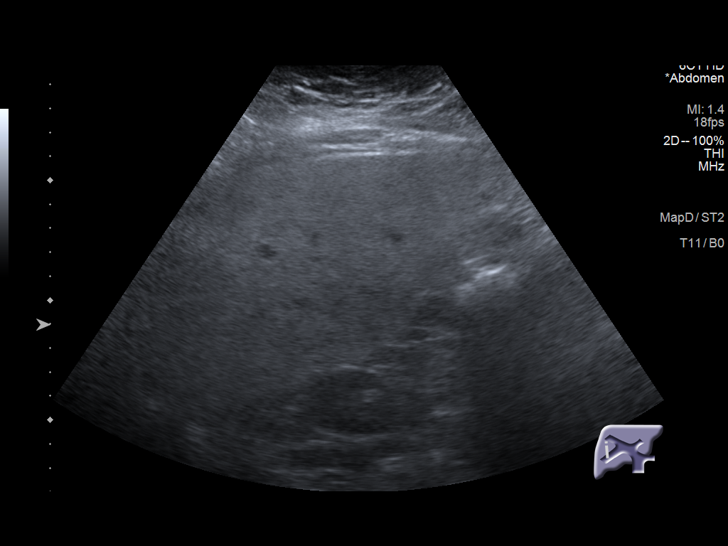
[im 35/53]
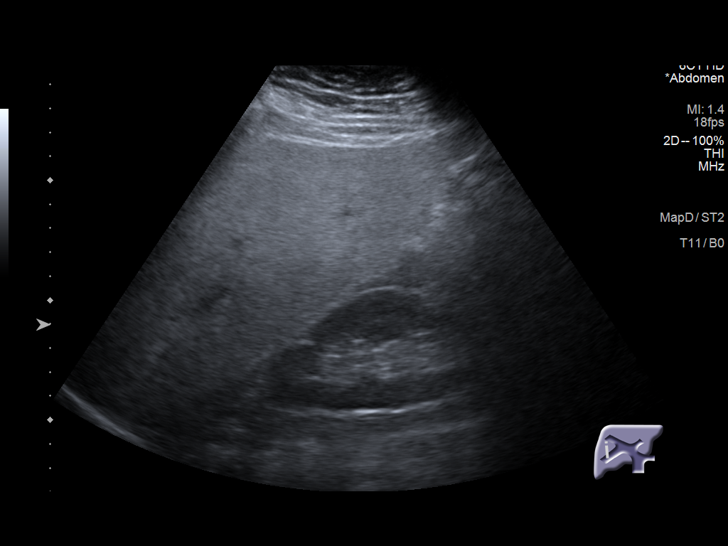
[im 40/53]
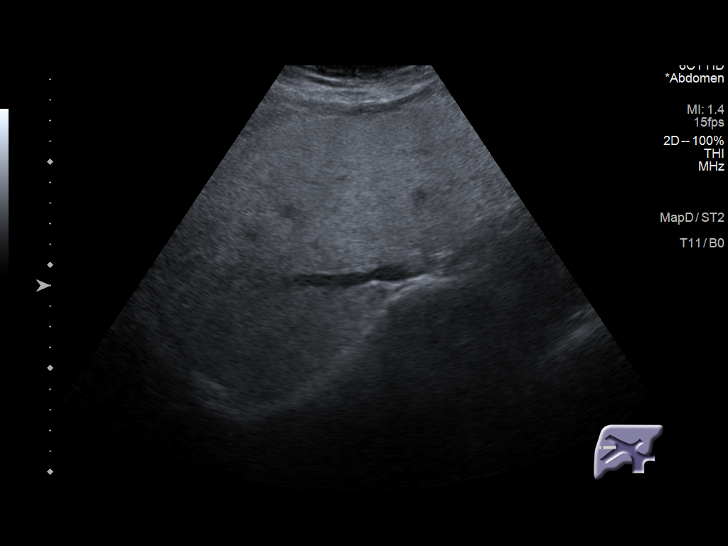
[im 44/53]
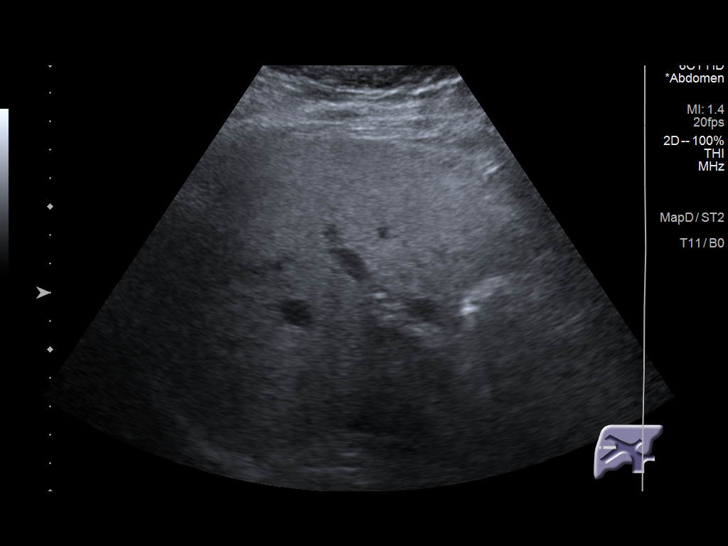
[im 48/53]
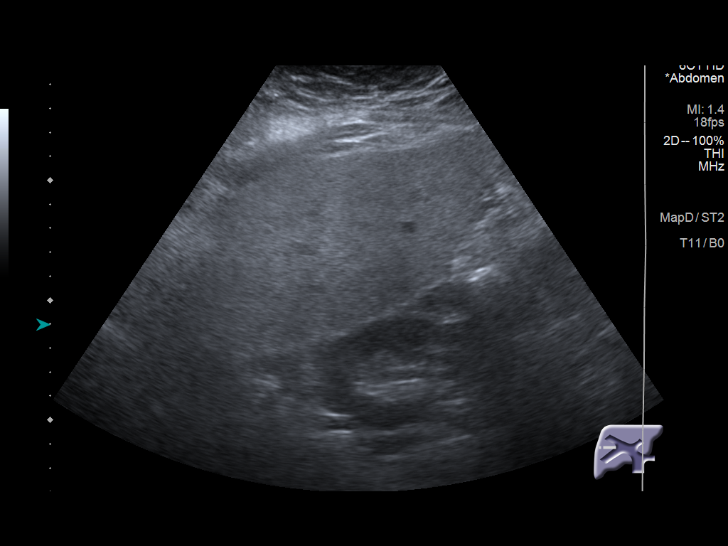
[im 53/53]
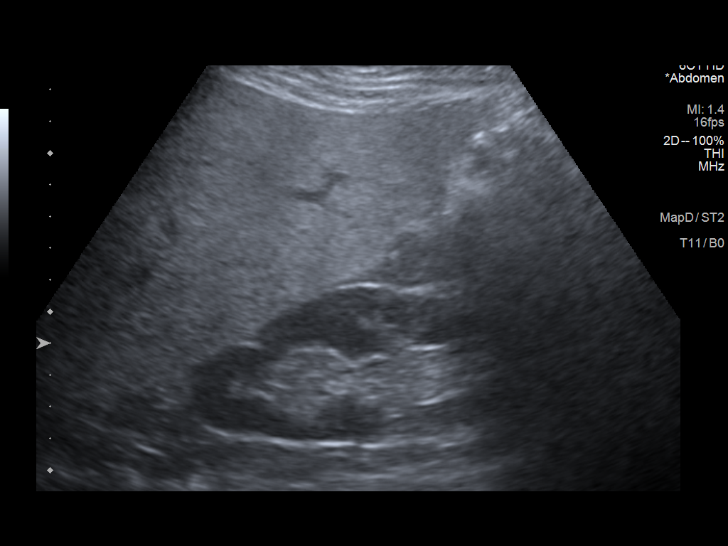

[14 of 25 positions shown; findings below may reference images not displayed]

FINDINGS: Gallbladder:

Surgically absent.

Common bile duct:

Diameter: 8 mm, within normal limits post cholecystectomy.

Liver:

Diffusely increased echogenicity of the hepatic parenchyma,
consistent with hepatic steatosis. No hepatic mass identified.
Portal vein is patent on color Doppler imaging with normal direction
of blood flow towards the liver.

Other: None.
IMPRESSION: Prior cholecystectomy.  No evidence of biliary ductal dilatation.

Diffuse hepatic steatosis.  No hepatic mass visualized.

## 2019-12-13 IMAGING — RF DG UGI W SINGLE CM
13 of 21 series · 14 of 24 positions shown · non-contrast
Comparison: Ultrasound [DATE].  CT [DATE].

CLINICAL DATA: History of hiatal hernia.

EXAM:
UPPER GI SERIES WITHOUT KUB
TECHNIQUE: Routine upper GI series was performed with thin/high density/water
soluble barium.
FLUOROSCOPY TIME:  Fluoroscopy Time:  3 minutes 24 seconds
Radiation Exposure Index (if provided by the fluoroscopic device):
131.6 mGy

[Series 1: fluoro_barium singleshot_bw · 0.18mm/px · 1 of 1 slices shown (1 of 2)]
[im 1/1]
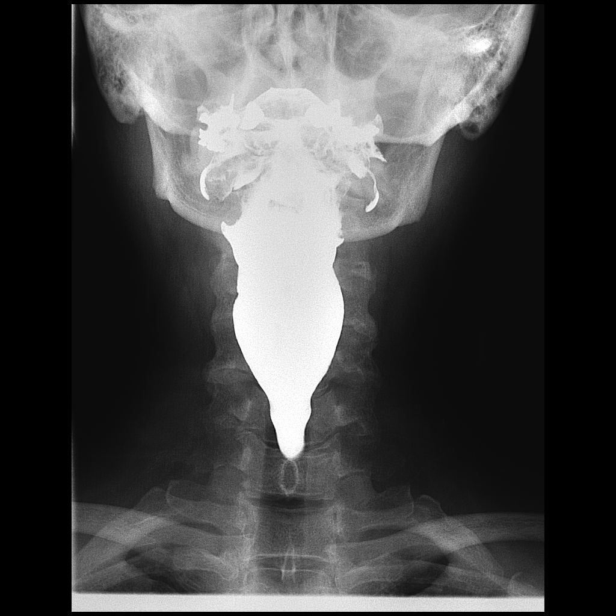

[Series 4: fluoro_barium singleshot_bw · 0.18mm/px · 1 of 1 slices shown (2 of 2)]
[im 1/1]
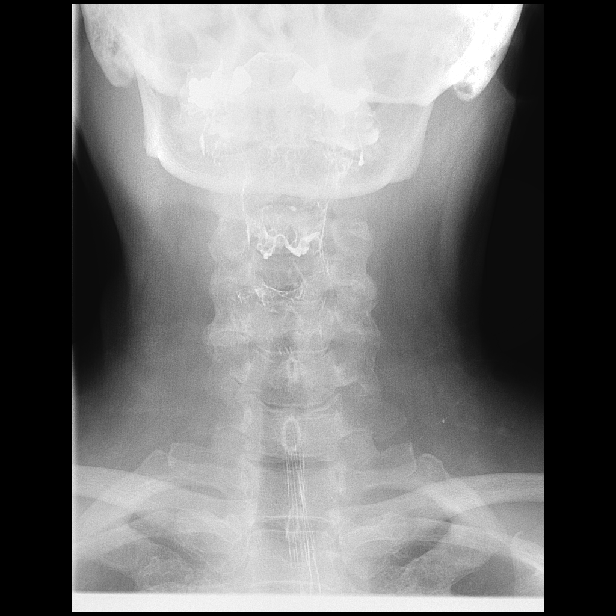

[Series 5: fluoro_barium 2fps_bw · 0.18mm/px · 1 of 2 frames shown (1 of 11)]
[frame 2/2]
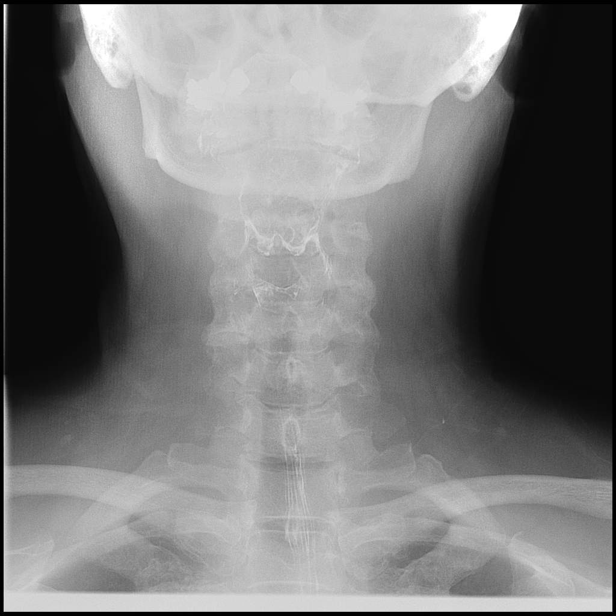

[Series 6: fluoro_barium 2fps_bw · 0.18mm/px · 2 of 6 frames shown (2 of 11)]
[frame 4/6]
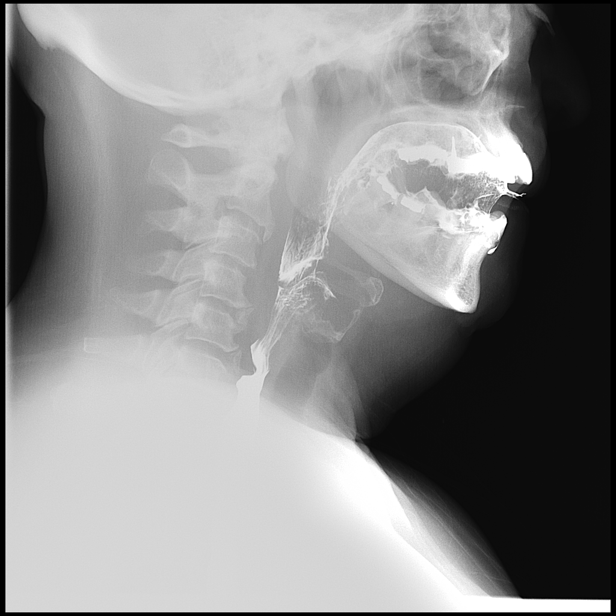
[frame 6/6]
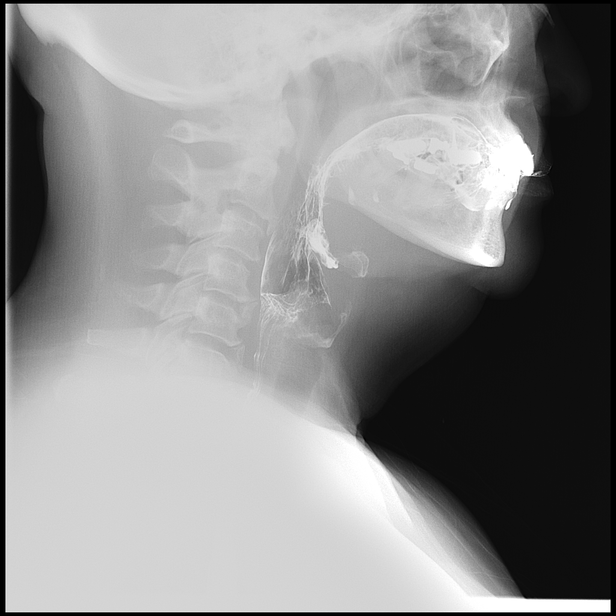

[Series 8: fluoro_barium 2fps_bw · 0.18mm/px · 1 of 1 slices shown (3 of 11)]
[im 1/1]
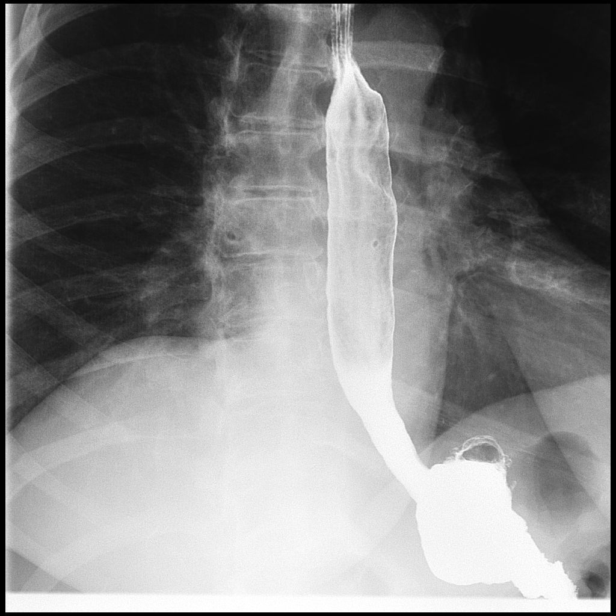

[Series 11: fluoro_barium 2fps_bw · 0.18mm/px · 1 of 1 slices shown (4 of 11)]
[im 1/1]
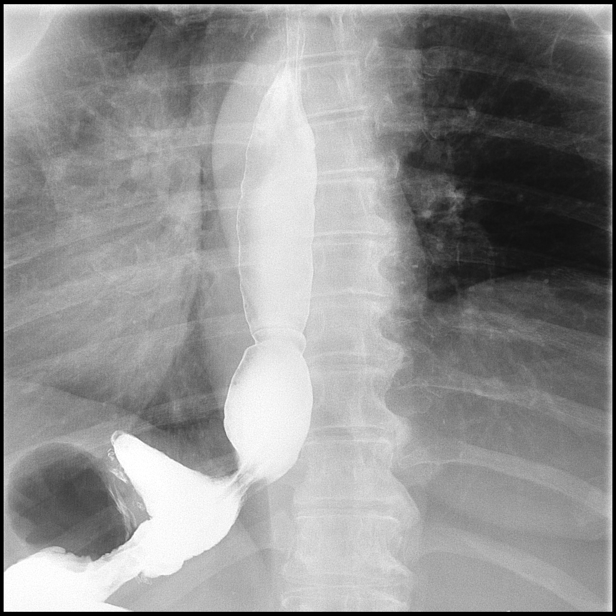

[Series 12: fluoro_barium 2fps_bw · 0.18mm/px · 1 of 1 slices shown (5 of 11)]
[im 1/1]
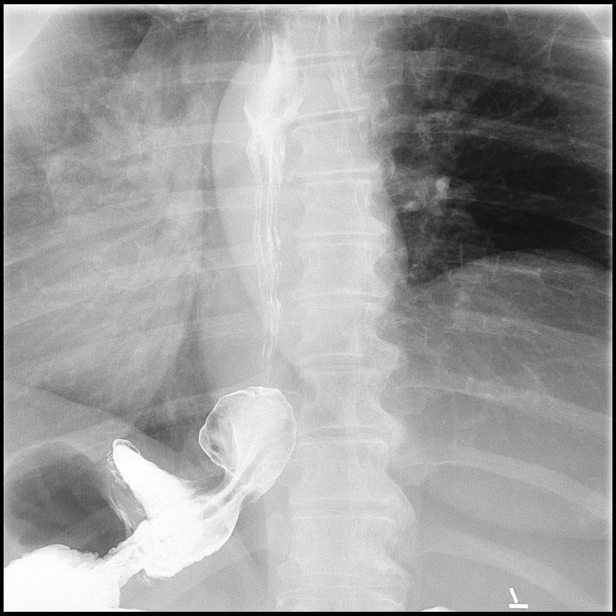

[Series 14: fluoro_barium 2fps_bw · 0.18mm/px · 1 of 2 frames shown (6 of 11)]
[frame 2/2]
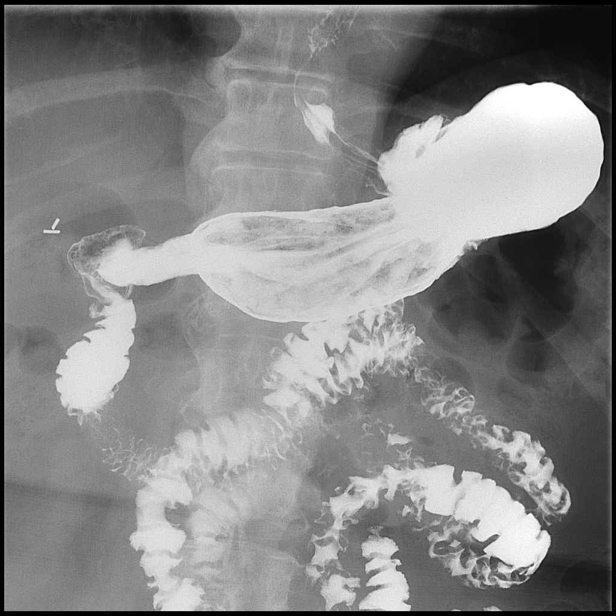

[Series 16: fluoro_barium 2fps_bw · 0.18mm/px · 1 of 1 slices shown (7 of 11)]
[im 1/1]
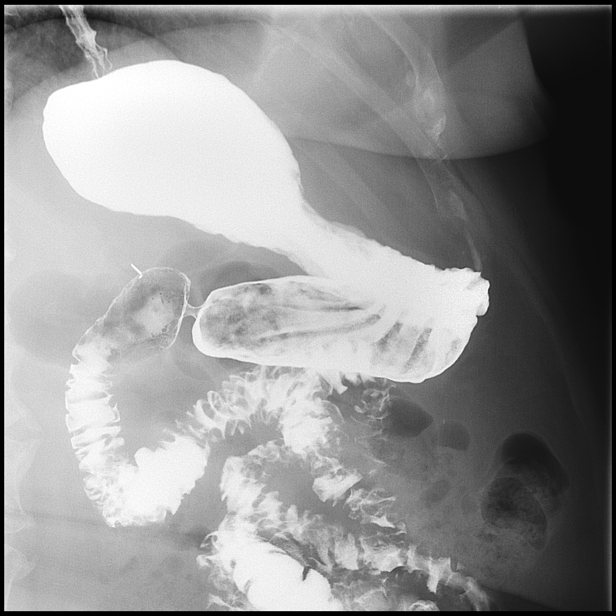

[Series 19: fluoro_barium 2fps_bw · 0.19mm/px · 1 of 1 slices shown (8 of 11)]
[im 1/1]
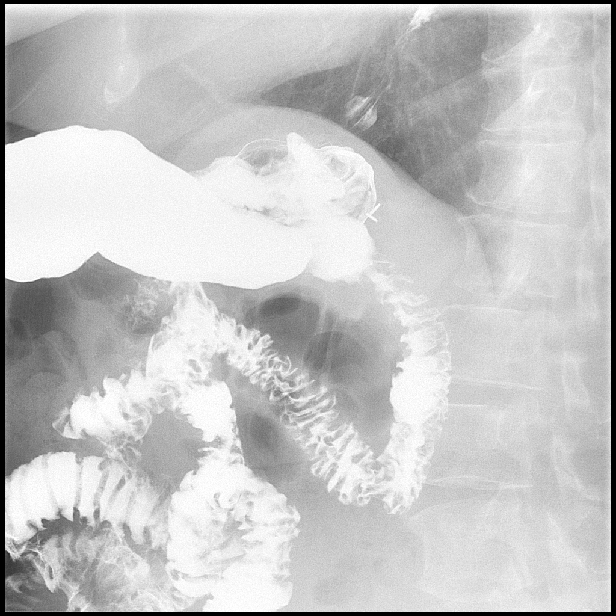

[Series 20: fluoro_barium 2fps_bw · 0.19mm/px · 1 of 1 slices shown (9 of 11)]
[im 1/1]
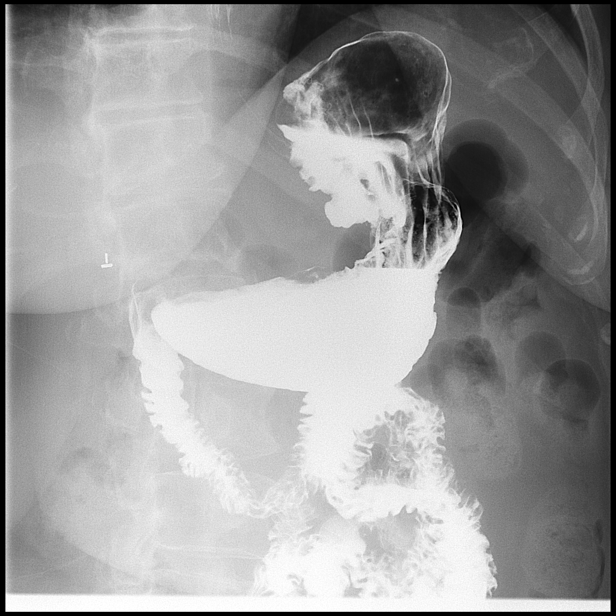

[Series 22: fluoro_barium 2fps_bw · 0.19mm/px · 1 of 2 frames shown (10 of 11)]
[frame 1/2]
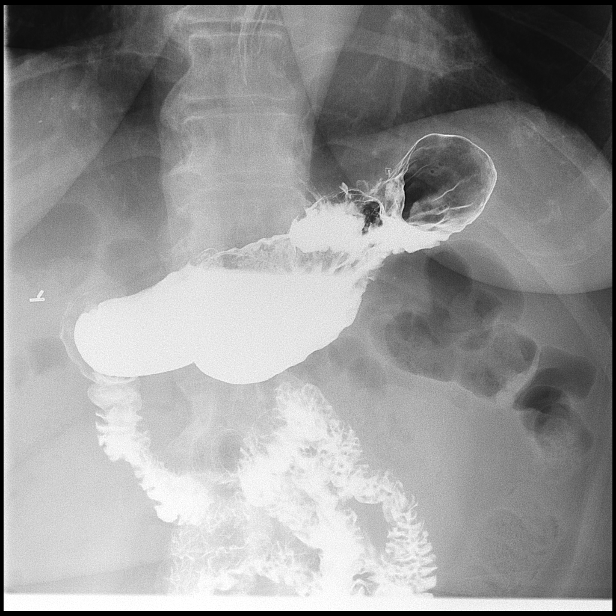

[Series 24: fluoro_barium 2fps_bw · 0.19mm/px · 1 of 1 slices shown (11 of 11)]
[im 1/1]
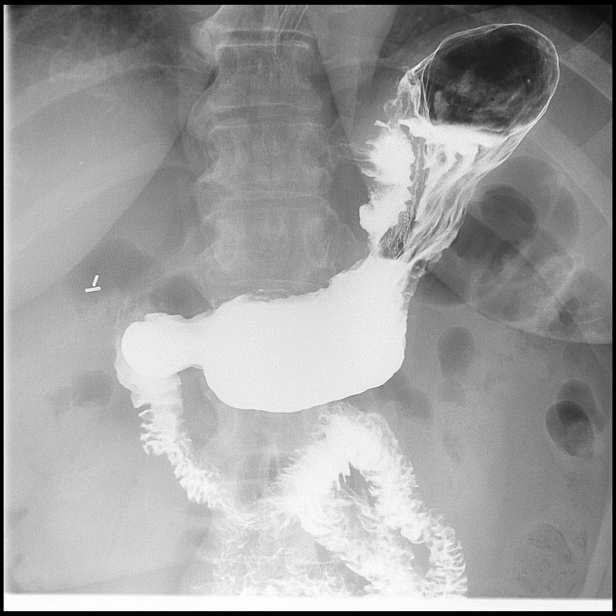

[14 of 24 positions shown; findings below may reference images not displayed]

FINDINGS: Moderate prominence of the cricopharyngeus fold is noted. Cervical
esophagus is otherwise widely patent. No evidence of aspiration.
Thoracic esophagus is widely patent. Small sliding hiatal hernia.

Noted along upper portion of the greater curvature the stomach is a
of focal prominent barium collection. Although these changes could
be related to prior gastric surgery active ulceration including
ulcerated mass cannot be excluded. Endoscopic evaluation suggested
for further evaluation. No other focal gastric abnormality
identified. Duodenum bulb and C-loop appear normal. Visualized
proximal small bowel appear normal.
IMPRESSION: 1. Moderate prominence of the cricopharyngeus fold. Cervical
esophagus otherwise unremarkable.

2. Noted along the upper portion of the greater curvature of the
stomach is a focal prominent barium collection. Although these
changes could be related to prior gastric surgery, active ulceration
including ulcerated mass cannot be excluded. Endoscopic evaluation
is suggested for further evaluation.

3.  Small sliding hiatal hernia.

## 2019-12-14 ENCOUNTER — Telehealth: Payer: Self-pay

## 2019-12-14 NOTE — Telephone Encounter (Signed)
-----   Message from Lucilla Lame, MD sent at 12/13/2019  1:27 PM EDT ----- Let the patient know that the barium upper GI series did not show the hernia I had seen with the scope therefore no further work-up is needed.

## 2019-12-14 NOTE — Telephone Encounter (Signed)
-----   Message from Lucilla Lame, MD sent at 12/13/2019  1:28 PM EDT ----- Let the patient know that the ultrasound showed significant fatty liver without any masses or worrisome lesions.

## 2019-12-14 NOTE — Telephone Encounter (Signed)
Pt notified of results via mychart.  

## 2020-02-10 ENCOUNTER — Other Ambulatory Visit: Payer: Self-pay | Admitting: Family

## 2020-02-10 DIAGNOSIS — F419 Anxiety disorder, unspecified: Secondary | ICD-10-CM

## 2020-02-12 NOTE — Telephone Encounter (Signed)
Refill request for xanax, last seen 10-23-19, last filled 11-23-19.  Please advise.

## 2020-02-23 ENCOUNTER — Other Ambulatory Visit: Payer: Self-pay | Admitting: Family

## 2020-02-23 DIAGNOSIS — E538 Deficiency of other specified B group vitamins: Secondary | ICD-10-CM

## 2020-03-07 ENCOUNTER — Ambulatory Visit: Payer: Medicare Other

## 2020-03-11 ENCOUNTER — Ambulatory Visit: Payer: Medicare PPO | Admitting: Family

## 2020-03-11 ENCOUNTER — Encounter: Payer: Self-pay | Admitting: Family

## 2020-03-11 ENCOUNTER — Other Ambulatory Visit: Payer: Self-pay

## 2020-03-11 ENCOUNTER — Other Ambulatory Visit: Payer: Self-pay | Admitting: Family

## 2020-03-11 VITALS — BP 116/78 | HR 74 | Temp 98.1°F | Resp 15 | Ht 63.5 in | Wt 195.6 lb

## 2020-03-11 DIAGNOSIS — R634 Abnormal weight loss: Secondary | ICD-10-CM | POA: Diagnosis not present

## 2020-03-11 DIAGNOSIS — E785 Hyperlipidemia, unspecified: Secondary | ICD-10-CM | POA: Diagnosis not present

## 2020-03-11 DIAGNOSIS — F3289 Other specified depressive episodes: Secondary | ICD-10-CM | POA: Diagnosis not present

## 2020-03-11 DIAGNOSIS — E538 Deficiency of other specified B group vitamins: Secondary | ICD-10-CM | POA: Diagnosis not present

## 2020-03-11 DIAGNOSIS — K7460 Unspecified cirrhosis of liver: Secondary | ICD-10-CM

## 2020-03-11 DIAGNOSIS — D582 Other hemoglobinopathies: Secondary | ICD-10-CM

## 2020-03-11 DIAGNOSIS — Z780 Asymptomatic menopausal state: Secondary | ICD-10-CM

## 2020-03-11 LAB — CBC WITH DIFFERENTIAL/PLATELET
Basophils Absolute: 0.1 10*3/uL (ref 0.0–0.1)
Basophils Relative: 0.8 % (ref 0.0–3.0)
Eosinophils Absolute: 0.1 10*3/uL (ref 0.0–0.7)
Eosinophils Relative: 1 % (ref 0.0–5.0)
HCT: 45.5 % (ref 36.0–46.0)
Hemoglobin: 15.2 g/dL — ABNORMAL HIGH (ref 12.0–15.0)
Lymphocytes Relative: 32.8 % (ref 12.0–46.0)
Lymphs Abs: 2.3 10*3/uL (ref 0.7–4.0)
MCHC: 33.4 g/dL (ref 30.0–36.0)
MCV: 96.3 fl (ref 78.0–100.0)
Monocytes Absolute: 0.4 10*3/uL (ref 0.1–1.0)
Monocytes Relative: 5.9 % (ref 3.0–12.0)
Neutro Abs: 4.2 10*3/uL (ref 1.4–7.7)
Neutrophils Relative %: 59.5 % (ref 43.0–77.0)
Platelets: 175 10*3/uL (ref 150.0–400.0)
RBC: 4.72 Mil/uL (ref 3.87–5.11)
RDW: 14.5 % (ref 11.5–15.5)
WBC: 7.1 10*3/uL (ref 4.0–10.5)

## 2020-03-11 LAB — HEMOGLOBIN A1C: Hgb A1c MFr Bld: 7.1 % — ABNORMAL HIGH (ref 4.6–6.5)

## 2020-03-11 LAB — COMPREHENSIVE METABOLIC PANEL
ALT: 37 U/L — ABNORMAL HIGH (ref 0–35)
AST: 28 U/L (ref 0–37)
Albumin: 4 g/dL (ref 3.5–5.2)
Alkaline Phosphatase: 71 U/L (ref 39–117)
BUN: 14 mg/dL (ref 6–23)
CO2: 28 mEq/L (ref 19–32)
Calcium: 9.3 mg/dL (ref 8.4–10.5)
Chloride: 105 mEq/L (ref 96–112)
Creatinine, Ser: 0.83 mg/dL (ref 0.40–1.20)
GFR: 67.45 mL/min (ref >60.00)
Glucose, Bld: 149 mg/dL — ABNORMAL HIGH (ref 70–99)
Potassium: 4.2 mEq/L (ref 3.5–5.1)
Sodium: 139 mEq/L (ref 135–145)
Total Bilirubin: 0.5 mg/dL (ref 0.2–1.2)
Total Protein: 6.6 g/dL (ref 6.0–8.3)

## 2020-03-11 LAB — MICROALBUMIN / CREATININE URINE RATIO
Creatinine,U: 171.9 mg/dL
Microalb Creat Ratio: 1 mg/g (ref 0.0–30.0)
Microalb, Ur: 1.7 mg/dL (ref 0.0–1.9)

## 2020-03-11 LAB — B12 AND FOLATE PANEL
Folate: 7.5 ng/mL (ref >5.9)
Vitamin B-12: 347 pg/mL (ref 211–911)

## 2020-03-11 LAB — TSH: TSH: 1.72 u[IU]/mL (ref 0.35–4.50)

## 2020-03-11 MED ORDER — PRAVASTATIN SODIUM 20 MG PO TABS: 20.0000 mg | ORAL_TABLET | ORAL | 1 refills | Status: DC

## 2020-03-11 NOTE — Assessment & Plan Note (Addendum)
Although weight loss has been advised in the setting of fatty liver disease, of late her weight loss been with  more rapid, this coincides with family stressors with granddaughters.  Advised patient that we will watch this closely, certainly make sure preventative screenings are up-to-date including mammogram.  Patient will schedule mammogram as well as  bone density.  Screening labs been ordered.  Advised patient to follow weight loss more closely, and if continues will offer further evaluation. Close follow up.

## 2020-03-11 NOTE — Progress Notes (Signed)
Subjective:    Patient ID: Kathy Bryant, female    DOB: 1946/05/03, 75 y.o.   MRN: [id number]  CC: Kathy Bryant is a 74 y.o. female who presents today for follow up.   HPI:   HLD- has stopped taking crestor. Myalgias resolved.   Primary complaint has been stress and feeling sad over family.  Stressors at home with two young granddaughters who had moved in for the month of June. They were disrespectful, fighting with each other. She had to bail one of the girls out of jail.  Tearful over all of this as she doesn't understand how this could happen to her lovely children's children.  Trouble falling asleep. Eating less. Lost 20 lbs which coincide to when stress with granddaughters moved in with her 3 months ago. Has also been to [facility] which was helpful in starting a higher protein diet.   Using rare xanax for anxiety, once every couple of weeks.  No si/hi  Has resumed smoking again due to stress.   No fever, vomiting, night sweats, bone pain.   Left arm bruising if 'barely touches anything.'  No epitaxis. No nsaids.   Due dexa, mammogram.    Cirrhosis following with Dr Allen Norris, last seen 12/11/2019 Kathy Bryant -did not show hepatocellular carcinoma Dr Rockey Situ 09/2019 Stress test done per patient ( cannot see in epic) and it was normal.   HISTORY:  Past Medical History:  Diagnosis Date   Anxiety    Bilateral calf pain    Diabetes mellitus without complication ([place])    PT STATES SHE WAS ON METFORMIN AND STOPPED TAKING THIS ON HER OWN   Fatty liver    Hepatitis 1960'S   B   Herniated disc, cervical    HTN (hypertension)    PT WAS ON BP MEDS AND THEN STATES HER BP WAS UNDER CONTROL AND SHE STOPPED TAKING BP ON HER OWN   Microscopic hematuria    Obesity    Swelling of both lower extremities    Past Surgical History:  Procedure Laterality Date   APPENDECTOMY  1968   BREAST BIOPSY Right 2013   x2 BENIGN BREAST TISSUE WITH FOCAL HYALINIZED STROMA  AND BENIGN BREAST TISSUE WITH FOCAL FIBROADENOMATOUS CHANGE    CESAREAN SECTION  1972 & 1978   CHOLECYSTECTOMY     COLONOSCOPY  03/05/2014   COLONOSCOPY WITH PROPOFOL N/A 04/27/2019   Procedure: COLONOSCOPY WITH PROPOFOL;  Surgeon: Kathy Lame, MD;  Location: [facility];  Service: Endoscopy;  Laterality: N/A;   ESOPHAGOGASTRODUODENOSCOPY (EGD) WITH PROPOFOL N/A 04/27/2019   Procedure: ESOPHAGOGASTRODUODENOSCOPY (EGD) WITH PROPOFOL;  Surgeon: Kathy Lame, MD;  Location: [facility];  Service: Endoscopy;  Laterality: N/A;   GALLBLADDER SURGERY  1982   GASTRIC RESTRICTION SURGERY  1980   'stomach stapled'   HEMORRHOID SURGERY N/A 12/23/2016   Procedure: HEMORRHOIDECTOMY;  Surgeon: Kathy Lye, MD;  Location: [facility];  Service: General;  Laterality: N/A;   HERNIA REPAIR     Abdominal   POLYPECTOMY N/A 04/27/2019   Procedure: POLYPECTOMY;  Surgeon: Kathy Lame, MD;  Location: [facility];  Service: Endoscopy;  Laterality: N/A;   TOTAL ABDOMINAL HYSTERECTOMY W/ BILATERAL SALPINGOOPHORECTOMY  1998   Family History  Problem Relation Age of Onset   Diabetes Mother    Cancer Mother 83       Brain Tumor - died age 94   Heart disease Father        CAD - died MI -  age 65   Hypertension Father    High Cholesterol Father    Diabetes Sister    Diabetes Brother    Diabetes Brother    Cancer Paternal Aunt        Renal Cell Ca   Cancer Paternal Uncle        Renal Cell Ca   Breast cancer Neg Hx     Allergies: Simvastatin Current Outpatient Medications on File Prior to Visit  Medication Sig Dispense Refill   ALPRAZolam (XANAX) 0.25 MG tablet Take 1 tablet by mouth twice daily as needed for anxiety 30 tablet 0   No current facility-administered medications on file prior to visit.    Social History   Tobacco Use   Smoking status: Current Every Day Smoker    Types: Cigarettes    Start date: 05/24/2019   Smokeless tobacco: Never  Used   Tobacco comment: "I WAS A CLOSET SMOKER AND NEVER SMOKED ALOT"  Vaping Use   Vaping Use: Never used  Substance Use Topics   Alcohol use: Yes    Comment: Occasional glass of wine   Drug use: No    Review of Systems  Constitutional: Negative for chills, diaphoresis, fever and unexpected weight change.  Respiratory: Negative for cough.   Cardiovascular: Negative for chest pain and palpitations.  Gastrointestinal: Negative for nausea and vomiting.  Psychiatric/Behavioral: Positive for sleep disturbance. Negative for suicidal ideas. The patient is nervous/anxious.       Objective:    BP 116/78 (BP Location: Left Arm, Patient Position: Sitting, Cuff Size: Large)    Pulse 74    Temp 98.1 F (36.7 C) (Oral)    Resp 15    Ht 5' 3.5" (1.613 m)    Wt 195 lb 9.6 oz (88.7 kg)    SpO2 97%    BMI 34.11 kg/m  BP Readings from Last 3 Encounters:  03/11/20 116/78  12/11/19 117/70  11/23/19 118/74   Wt Readings from Last 3 Encounters:  03/11/20 195 lb 9.6 oz (88.7 kg)  12/11/19 207 lb 3.2 oz (94 kg)  11/23/19 207 lb 6.4 oz (94.1 kg)    Physical Exam Vitals reviewed.  Constitutional:      Appearance: She is well-developed.  Eyes:     Conjunctiva/sclera: Conjunctivae normal.  Cardiovascular:     Rate and Rhythm: Normal rate and regular rhythm.     Pulses: Normal pulses.     Heart sounds: Normal heart sounds.  Pulmonary:     Effort: Pulmonary effort is normal.     Breath sounds: Normal breath sounds. No wheezing, rhonchi or rales.  Skin:    General: Skin is warm and dry.  Neurological:     Mental Status: She is alert.  Psychiatric:        Speech: Speech normal.        Behavior: Behavior normal.        Thought Content: Thought content normal.        Assessment & Plan:   Problem List Items Addressed This Visit      Digestive   Cirrhosis of liver [facility])    Patient has been following with Dr. Allen Norris.  In the setting of easy bruising, pending CBC, CMP      Relevant  Medications   pravastatin (PRAVACHOL) 20 MG tablet   Other Relevant Orders   CBC with Differential/Platelet   Comprehensive metabolic panel   Microalbumin / creatinine urine ratio     Other   B12 deficiency - Primary  History of.  If Vitamin b12 deficient, patient would like to resume B12 injections which she had done in the past      Relevant Orders   B12 and Folate Panel   Depression    Patient declines any medication for depression or trouble sleeping.  We will continue to follow this.      HLD (hyperlipidemia)    We will start back pravachol in the setting of cirrhosis.Pending cmp today and again 6 weeks at follow up.  She will start pravachol once weekly and we will slowly increase from there.       Relevant Medications   pravastatin (PRAVACHOL) 20 MG tablet   Weight loss    Although weight loss has been advised in the setting of fatty liver disease, of late her weight loss been with  more rapid, this coincides with family stressors with granddaughters.  Advised patient that we will watch this closely, certainly make sure preventative screenings are up-to-date including mammogram.  Patient will schedule mammogram as well as  bone density.  Screening labs been ordered.  Advised patient to follow weight loss more closely, and if continues will offer further evaluation. Close follow up.      Relevant Orders   Hemoglobin A1c   TSH   Microalbumin / creatinine urine ratio    Other Visit Diagnoses    Postmenopausal estrogen deficiency       Relevant Orders   MM 3D SCREEN BREAST BILATERAL   DG Bone Density       I have discontinued Analya R. Mimnaugh's rosuvastatin. I am also having her start on pravastatin. Additionally, I am having her maintain her ALPRAZolam.   Meds ordered this encounter  Medications   pravastatin (PRAVACHOL) 20 MG tablet    Sig: Take 1 tablet (20 mg total) by mouth once a week.    Dispense:  12 tablet    Refill:  1    Order Specific Question:    Supervising Provider    Answer:   Crecencio Mc [[id number]]    Return precautions given.   Risks, benefits, and alternatives of the medications and treatment plan prescribed today were discussed, and patient expressed understanding.   Education regarding symptom management and diagnosis given to patient on AVS.  Continue to follow with Burnard Hawthorne, FNP for routine health maintenance.   Kathy Bryant and I agreed with plan.   [name], FNP

## 2020-03-11 NOTE — Assessment & Plan Note (Signed)
We will start back pravachol in the setting of cirrhosis.Pending cmp today and again 6 weeks at follow up.  She will start pravachol once weekly and we will slowly increase from there.

## 2020-03-11 NOTE — Patient Instructions (Signed)
Start pravachol once per week.   Lets have a follow up in 6 weeks and we will repeat labs that day as well.   Let me know if you need anything at all.   Please call  and schedule your 3D mammogram, bone density scan as discussed.   [place], [place]

## 2020-03-11 NOTE — Assessment & Plan Note (Signed)
Patient has been following with Dr. Allen Norris.  In the setting of easy bruising, pending CBC, CMP

## 2020-03-11 NOTE — Assessment & Plan Note (Signed)
Patient declines any medication for depression or trouble sleeping.  We will continue to follow this.

## 2020-03-11 NOTE — Assessment & Plan Note (Signed)
History of.  If Vitamin b12 deficient, patient would like to resume B12 injections which she had done in the past

## 2020-03-12 NOTE — Telephone Encounter (Signed)
Patient returning office phone call.

## 2020-03-14 ENCOUNTER — Other Ambulatory Visit: Payer: Self-pay | Admitting: Family

## 2020-03-14 DIAGNOSIS — E538 Deficiency of other specified B group vitamins: Secondary | ICD-10-CM

## 2020-03-14 MED ORDER — CYANOCOBALAMIN 1000 MCG/ML IJ SOLN: INTRAMUSCULAR | 15 refills | Status: DC

## 2020-04-24 ENCOUNTER — Telehealth: Payer: Self-pay | Admitting: Family

## 2020-04-24 NOTE — Telephone Encounter (Signed)
I spoke with patient after speaking with Kerin Salen, RN that she to be safe should test 5 days after granddaughter being diagnosed with covid. I explained even though she does not have symptoms she can still be a carrier. I advised that we could not tell her not to go to work since she is fully vaccinated, but if she goes out in public she can be spreading virus to others. I also advised 10 day quarantine per CBC & that we had advised patients 14 days.

## 2020-04-24 NOTE — Telephone Encounter (Signed)
LMTCB

## 2020-04-24 NOTE — Telephone Encounter (Signed)
Patient is fully vaccinated and her granddaughter lives with her and has covid. What does patient need to do, no symptoms.

## 2020-04-28 ENCOUNTER — Other Ambulatory Visit (HOSPITAL_COMMUNITY): Payer: Self-pay | Admitting: Nurse Practitioner

## 2020-04-28 ENCOUNTER — Telehealth (INDEPENDENT_AMBULATORY_CARE_PROVIDER_SITE_OTHER): Payer: Medicare PPO | Admitting: Family

## 2020-04-28 ENCOUNTER — Other Ambulatory Visit: Payer: Self-pay

## 2020-04-28 ENCOUNTER — Encounter: Payer: Self-pay | Admitting: Family

## 2020-04-28 DIAGNOSIS — Z20822 Contact with and (suspected) exposure to covid-19: Secondary | ICD-10-CM

## 2020-04-28 DIAGNOSIS — U071 COVID-19: Secondary | ICD-10-CM

## 2020-04-28 DIAGNOSIS — F419 Anxiety disorder, unspecified: Secondary | ICD-10-CM

## 2020-04-28 DIAGNOSIS — E6609 Other obesity due to excess calories: Secondary | ICD-10-CM

## 2020-04-28 DIAGNOSIS — E1165 Type 2 diabetes mellitus with hyperglycemia: Secondary | ICD-10-CM

## 2020-04-28 DIAGNOSIS — Z03818 Encounter for observation for suspected exposure to other biological agents ruled out: Secondary | ICD-10-CM | POA: Diagnosis not present

## 2020-04-28 MED ORDER — ALPRAZOLAM 0.25 MG PO TABS: 0.2500 mg | ORAL_TABLET | Freq: Two times a day (BID) | ORAL | 1 refills | Status: DC | PRN

## 2020-04-28 NOTE — Patient Instructions (Addendum)
I have called regarding monoclonal antitbodies infusion. You will get a call from respiratory for this.   Go to drive through pharmacy for below as we discussed:   Please purchase pulse oximeter to monitor oxygen level and ensure it is greater than 90%, if less, you will need to go emergency room.   Please start Mucinex DM to take at bedtime for cough.     Please begin Vit C 1000 mg daily   Vitamin D3 4000 IU daily   Zinc 100 g daily   Quercetin 250 mg -500 mg twice daily    Rest, hydrate very well, eat healthy protein food, Tylenol or Advil as directed .    Please go to Acute Care if symptoms worsen but are not an emergency. Office video visit again early next week.    Remain in self quarantine until at least 10 days since symptom onset AND 3 consecutive days fever free without antipyretics AND improvement in respiratory symptoms.    Utilize over the counter medications to treat symptoms.   Seek  treatment in the ED if respiratory issues/distress develops or unrelieved chest pain. Or, if you become severely weak, dehydrated, confused.    Only leave home to seek medical care and must wear a mask in public. Limit contact with family members or caregivers in the home and to notify her family who she was with yesterday that they should quarantine for 14 days. Practice social distancing and to continue to use good preventative care measures such has frequent hand washing.      Your COVID-19 test was resulted as positive.    You should continue your self imposed quantarine until you can answer "yes" to ALL 3 of the conditions below:   1) Your symptoms (fever, cough, shortness of breath) started 7 or more days ago   2) Your body temperature  has been normal for at least 72 hours (WITHOUT the use of any tylenol, motrin or aleve) . Normal is < 100.4 Farenheit  3) your other flu  like symptoms symptoms are getting better.   YOUR FAMILY or other household contacts, however , even if they  feel fine , will need to continue to quarantining themselves  (that means no contact with ANYONE outside of the house)  for 14 days STARTING from the end of your initial 7 day period . (why? because they have been theoretically exposed to you during the entire 7 days of your illness, and they can sheD the virus without symptoms for that period of time ).         10 Things You Can Do to Manage Your COVID-19 Symptoms at Home If you have possible or confirmed COVID-19: 1. Stay home from work and school. And stay away from other public places. If you must go out, avoid using any kind of public transportation, ridesharing, or taxis. 2. Monitor your symptoms carefully. If your symptoms get worse, call your healthcare provider immediately. 3. Get rest and stay hydrated. 4. If you have a medical appointment, call the healthcare provider ahead of time and tell them that you have or may have COVID-19. 5. For medical emergencies, call 911 and notify the dispatch personnel that you have or may have COVID-19. 6. Cover your cough and sneezes with a tissue or use the inside of your elbow. 7. Wash your hands often with soap and water for at least 20 seconds or clean your hands with an alcohol-based hand sanitizer that contains at least 60% alcohol.  8. As much as possible, stay in a specific room and away from other people in your home. Also, you should use a separate bathroom, if available. If you need to be around other people in or outside of the home, wear a mask. 9. Avoid sharing personal items with other people in your household, like dishes, towels, and bedding. 10. Clean all surfaces that are touched often, like counters, tabletops, and doorknobs. Use household cleaning sprays or wipes according to the label instructions. [email or web address] 02/07/2019

## 2020-04-28 NOTE — Assessment & Plan Note (Signed)
Stable. Continue prn use of xanax. I looked up patient on [place] Controlled Substances Reporting System PMP AWARE and saw no activity that raised concern of inappropriate use.

## 2020-04-28 NOTE — Progress Notes (Signed)
Virtual Visit via Video Note  I connected with@  on 04/28/20 at 11:00 AM EDT by a video enabled telemedicine application and verified that I am speaking with the correct person using two identifiers.  Location patient: home Location provider:work  Persons participating in the virtual visit: patient, provider  I discussed the limitations of evaluation and management by telemedicine and the availability of in person appointments. The patient expressed understanding and agreed to proceed.   HPI:  CC: dry cough x 2 days, worsening.   3 days ago symptoms started with frontal HA, since improved.  Endorses fatigue, nasal congestion.  No vision changes,fever, sob, cp, wheezing, left arm numbness, leg swelling.    Granddaughter positive whom  lives with her. She is in line at covid testing site.   Fully vaccinated for covid with [employer].   H/o low b12, never came to have rest of labs done.   GAD- overall controlled. Takes xanax 2-3 times per week. would like refill of xanax.   She is on mychart and can get her visit today  ROS: See pertinent positives and negatives per HPI.    EXAM:  VITALS per patient if applicable:  GENERAL: alert, oriented, appears well and in no acute distress  HEENT: atraumatic, conjunttiva clear, no obvious abnormalities on inspection of external nose and ears  NECK: normal movements of the head and neck  LUNGS: on inspection no signs of respiratory distress, breathing rate appears normal, no obvious gross SOB, gasping or wheezing. Dry cough on occasion  CV: no obvious cyanosis  MS: moves all visible extremities without noticeable abnormality  PSYCH/NEURO: pleasant and cooperative, no obvious depression or anxiety, speech and thought processing grossly intact  ASSESSMENT AND PLAN:  Discussed the following assessment and plan:  Problem List Items Addressed This Visit      Other   Anxiety    Stable. Continue prn use of xanax. I looked up patient  on  Controlled Substances Reporting System PMP AWARE and saw no activity that raised concern of inappropriate use.        Relevant Medications   ALPRAZolam (XANAX) 0.25 MG tablet   Close exposure to COVID-19 virus    Very close exposure. Symptomatic. No SOB, afebrile. She is well appearing on video today and no respiratory distress. Awaiting covid test. We have called monoclonal antibody infusion clinic and advised patient of this due to her increased risk of severe disease. Advised of mucinex, vitamins for which she will log onto mychart and review my list of vitamins as well as purchase a pulse oximeter. She will let us know how she is doing.          -we discussed possible serious and likely etiologies, options for evaluation and workup, limitations of telemedicine visit vs in person visit, treatment, treatment risks and precautions. Pt prefers to treat via telemedicine empirically rather then risking or undertaking an in person visit at this moment.  .   I discussed the assessment and treatment plan with the patient. The patient was provided an opportunity to ask questions and all were answered. The patient agreed with the plan and demonstrated an understanding of the instructions.   The patient was advised to call back or seek an in-person evaluation if the symptoms worsen or if the condition fails to improve as anticipated.   [name], FNP

## 2020-04-28 NOTE — Assessment & Plan Note (Signed)
Very close exposure. Symptomatic. No SOB, afebrile. She is well appearing on video today and no respiratory distress. Awaiting covid test. We have called monoclonal antibody infusion clinic and advised patient of this due to her increased risk of severe disease. Advised of mucinex, vitamins for which she will log onto mychart and review my list of vitamins as well as purchase a pulse oximeter. She will let us know how she is doing.

## 2020-04-28 NOTE — Progress Notes (Signed)
I connected by phone with Kathy Bryant on 04/28/2020 at 6:33 PM to discuss the potential use of a new treatment for mild to moderate COVID-19 viral infection in non-hospitalized patients.  This patient is a 74 y.o. female that meets the FDA criteria for Emergency Use Authorization of COVID monoclonal antibody casirivimab/imdevimab.  Has a (+) direct SARS-CoV-2 viral test result  Has mild or moderate COVID-19   Is NOT hospitalized due to COVID-19  Is within 10 days of symptom onset  Has at least one of the high risk factor(s) for progression to severe COVID-19 and/or hospitalization as defined in EUA.  Specific high risk criteria : BMI > 25 and Cardiovascular disease or hypertension   I have spoken and communicated the following to the patient or parent/caregiver regarding COVID monoclonal antibody treatment:  1. FDA has authorized the emergency use for the treatment of mild to moderate COVID-19 in adults and pediatric patients with positive results of direct SARS-CoV-2 viral testing who are 90 years of age and older weighing at least 40 kg, and who are at high risk for progressing to severe COVID-19 and/or hospitalization.  2. The significant known and potential risks and benefits of COVID monoclonal antibody, and the extent to which such potential risks and benefits are unknown.  3. Information on available alternative treatments and the risks and benefits of those alternatives, including clinical trials.  4. Patients treated with COVID monoclonal antibody should continue to self-isolate and use infection control measures (e.g., wear mask, isolate, social distance, avoid sharing personal items, clean and disinfect high touch surfaces, and frequent handwashing) according to CDC guidelines.   5. The patient or parent/caregiver has the option to accept or refuse COVID monoclonal antibody treatment.  After reviewing this information with the patient, The patient agreed to proceed with  receiving casirivimab\imdevimab infusion and will be provided a copy of the Fact sheet prior to receiving the infusion. Kathy Bryant 04/28/2020 6:33 PM

## 2020-04-29 ENCOUNTER — Other Ambulatory Visit (HOSPITAL_COMMUNITY): Payer: Self-pay

## 2020-04-29 ENCOUNTER — Ambulatory Visit (HOSPITAL_COMMUNITY)
Admission: RE | Admit: 2020-04-29 | Discharge: 2020-04-29 | Disposition: A | Discharge status: Home / Self Care | Payer: Medicare Other | Source: Ambulatory Visit | Attending: Pulmonary Disease | Admitting: Pulmonary Disease

## 2020-04-29 DIAGNOSIS — U071 COVID-19: Secondary | ICD-10-CM | POA: Insufficient documentation

## 2020-04-29 DIAGNOSIS — E6609 Other obesity due to excess calories: Secondary | ICD-10-CM | POA: Insufficient documentation

## 2020-04-29 DIAGNOSIS — Z23 Encounter for immunization: Secondary | ICD-10-CM | POA: Insufficient documentation

## 2020-04-29 DIAGNOSIS — E1165 Type 2 diabetes mellitus with hyperglycemia: Secondary | ICD-10-CM | POA: Diagnosis present

## 2020-04-29 MED ORDER — ALBUTEROL SULFATE HFA 108 (90 BASE) MCG/ACT IN AERS: 2.0000 | INHALATION_SPRAY | Freq: Once | RESPIRATORY_TRACT | Status: DC | PRN

## 2020-04-29 MED ORDER — SODIUM CHLORIDE 0.9 % IV SOLN: INTRAVENOUS | Status: DC | PRN

## 2020-04-29 MED ORDER — EPINEPHRINE 0.3 MG/0.3ML IJ SOAJ: 0.3000 mg | Freq: Once | INTRAMUSCULAR | Status: DC | PRN

## 2020-04-29 MED ORDER — FAMOTIDINE IN NACL 20-0.9 MG/50ML-% IV SOLN: 20.0000 mg | Freq: Once | INTRAVENOUS | Status: DC | PRN

## 2020-04-29 MED ORDER — DIPHENHYDRAMINE HCL 50 MG/ML IJ SOLN: 50.0000 mg | Freq: Once | INTRAMUSCULAR | Status: DC | PRN

## 2020-04-29 MED ORDER — METHYLPREDNISOLONE SODIUM SUCC 125 MG IJ SOLR: 125.0000 mg | Freq: Once | INTRAMUSCULAR | Status: DC | PRN

## 2020-04-29 MED ORDER — SODIUM CHLORIDE 0.9 % IV SOLN
1200.0000 mg | Freq: Once | INTRAVENOUS | Status: AC
  Administered 2020-04-29: 1200 mg via INTRAVENOUS

## 2020-04-29 NOTE — Progress Notes (Signed)
°  Diagnosis: COVID-19 ° °Physician: Dr. Patrick Wright ° °Procedure: Covid Infusion Clinic Med: casirivimab\imdevimab infusion - Provided patient with casirivimab\imdevimab fact sheet for patients, parents and caregivers prior to infusion. ° °Complications: No immediate complications noted. ° °Discharge: Discharged home  ° °Kathy Bryant °04/29/2020 ° ° °

## 2020-04-29 NOTE — Discharge Instructions (Signed)

## 2020-04-30 ENCOUNTER — Encounter: Payer: Self-pay | Admitting: Family

## 2020-05-02 ENCOUNTER — Telehealth: Payer: Self-pay | Admitting: Family

## 2020-05-02 NOTE — Telephone Encounter (Signed)
Pt wants to talk to provider but did not disclose why-please call at your earliest convenience.

## 2020-05-02 NOTE — Telephone Encounter (Signed)
I have spoken with patient & this was in regards to her husband.

## 2020-05-13 ENCOUNTER — Encounter: Payer: Self-pay | Admitting: Family

## 2020-05-16 DIAGNOSIS — Z03818 Encounter for observation for suspected exposure to other biological agents ruled out: Secondary | ICD-10-CM | POA: Diagnosis not present

## 2020-05-16 DIAGNOSIS — Z20822 Contact with and (suspected) exposure to covid-19: Secondary | ICD-10-CM | POA: Diagnosis not present

## 2020-05-22 ENCOUNTER — Other Ambulatory Visit: Payer: Self-pay | Admitting: Family

## 2020-05-22 DIAGNOSIS — R921 Mammographic calcification found on diagnostic imaging of breast: Secondary | ICD-10-CM

## 2020-05-30 ENCOUNTER — Ambulatory Visit: Payer: Medicare PPO

## 2020-05-30 NOTE — Progress Notes (Signed)
Patient contacted for scheduled awv appointment and asked [facility] to reschedule for a later date due to not being able to successfully complete the call at this time. Rescheduled for appointment on 06/02/20 @ 1:00 at this time. Telephone visit.

## 2020-06-02 ENCOUNTER — Ambulatory Visit (INDEPENDENT_AMBULATORY_CARE_PROVIDER_SITE_OTHER): Payer: Medicare PPO

## 2020-06-02 VITALS — Ht 63.5 in | Wt 193.0 lb

## 2020-06-02 DIAGNOSIS — Z0000 Encounter for general adult medical examination without abnormal findings: Secondary | ICD-10-CM

## 2020-06-02 NOTE — Progress Notes (Addendum)
Subjective:   Kathy Bryant is a 74 y.o. female who presents for Medicare Annual (Subsequent) preventive examination.  Review of Systems    No ROS.  Medicare Wellness Virtual Visit.    Cardiac Risk Factors include: advanced age (>7men, >57 women);diabetes mellitus     Objective:    Today's Vitals   06/02/20 1306  Weight: 193 lb (87.5 kg)  Height: 5' 3.5" (1.613 m)   Body mass index is 33.65 kg/m.  Advanced Directives 06/02/2020 04/27/2019 03/07/2019 12/23/2016 12/22/2016  Does Patient Have a Medical Advance Directive? No No No No No  Would patient like information on creating a medical advance directive? No - Patient declined No - Patient declined Yes (MAU/Ambulatory/Procedural Areas - Information given) No - Patient declined -    Current Medications (verified) Outpatient Encounter Medications as of 06/02/2020  Medication Sig  . ALPRAZolam (XANAX) 0.25 MG tablet Take 1 tablet (0.25 mg total) by mouth 2 (two) times daily as needed. for anxiety  . cyanocobalamin (,VITAMIN B-12,) 1000 MCG/ML injection 1000 mcg (1 mL) intramuscular injection in the thigh ( vastus lateralis) once per month. (Patient not taking: Reported on 04/28/2020)  . pravastatin (PRAVACHOL) 20 MG tablet Take 1 tablet (20 mg total) by mouth once a week. (Patient not taking: Reported on 04/28/2020)   No facility-administered encounter medications on file as of 06/02/2020.    Allergies (verified) Simvastatin   History: Past Medical History:  Diagnosis Date  . Anxiety   . Bilateral calf pain   . Diabetes mellitus without complication ([place])    PT STATES SHE WAS ON METFORMIN AND STOPPED TAKING THIS ON HER OWN  . Fatty liver   . 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 Herniated disc, cervical   . HTN (hypertension)    PT WAS ON BP MEDS AND THEN STATES HER BP WAS UNDER CONTROL AND SHE STOPPED TAKING BP ON HER OWN  . Microscopic hematuria   . Obesity   . Swelling of both lower extremities    Past Surgical History:    Procedure Laterality Date  . APPENDECTOMY  1968  . BREAST BIOPSY Right 2013   x2 BENIGN BREAST TISSUE WITH FOCAL HYALINIZED STROMA AND BENIGN BREAST TISSUE WITH FOCAL FIBROADENOMATOUS CHANGE   . [place]  . CHOLECYSTECTOMY    . COLONOSCOPY  03/05/2014  . COLONOSCOPY WITH PROPOFOL N/A 04/27/2019   Procedure: COLONOSCOPY WITH PROPOFOL;  Surgeon: Lucilla Lame, MD;  Location: [facility];  Service: Endoscopy;  Laterality: N/A;  . ESOPHAGOGASTRODUODENOSCOPY (EGD) WITH PROPOFOL N/A 04/27/2019   Procedure: ESOPHAGOGASTRODUODENOSCOPY (EGD) WITH PROPOFOL;  Surgeon: Lucilla Lame, MD;  Location: [facility];  Service: Endoscopy;  Laterality: N/A;  . [place]  . GASTRIC RESTRICTION SURGERY  1980   'stomach stapled'  . HEMORRHOID SURGERY N/A 12/23/2016   Procedure: HEMORRHOIDECTOMY;  Surgeon: Christene Lye, MD;  Location: [facility];  Service: General;  Laterality: N/A;  . HERNIA REPAIR     Abdominal  . POLYPECTOMY N/A 04/27/2019   Procedure: POLYPECTOMY;  Surgeon: Lucilla Lame, MD;  Location: [facility];  Service: Endoscopy;  Laterality: N/A;  . TOTAL ABDOMINAL HYSTERECTOMY W/ BILATERAL SALPINGOOPHORECTOMY  1998   Family History  Problem Relation Age of Onset  . Diabetes Mother   . Cancer Mother 80       Brain Tumor - died age 60  . Heart disease Father        CAD - died MI -  age 52  . Hypertension Father   . High Cholesterol Father   . Diabetes Sister   . Diabetes Brother   . Diabetes Brother   . Cancer Paternal Aunt        Renal Cell Ca  . Cancer Paternal Uncle        Renal Cell Ca  . Breast cancer Neg Hx    Social History   Socioeconomic History  . Marital status: Married    Spouse name: Not on file  . Number of children: 2  . Years of education: 8  . Highest education level: Not on file  Occupational History  . Occupation: Sports administrator: [employer]   Tobacco Use  . Smoking status:  Current Every Day Smoker    Types: Cigarettes    Start date: 05/24/2019  . Smokeless tobacco: Never Used  . Tobacco comment: "I WAS A CLOSET SMOKER AND NEVER SMOKED ALOT"  Vaping Use  . Vaping Use: Never used  Substance and Sexual Activity  . Alcohol use: Yes    Comment: Occasional glass of wine  . Drug use: No  . Sexual activity: Not on file  Other Topics Concern  . Not on file  Social History Narrative  . Not on file   Social Determinants of Health   Financial Resource Strain: Low Risk   . Difficulty of Paying Living Expenses: Not hard at all  Food Insecurity: No Food Insecurity  . Worried About Charity fundraiser in the Last Year: Never true  . Ran Out of Food in the Last Year: Never true  Transportation Needs: No Transportation Needs  . Lack of Transportation (Medical): No  . Lack of Transportation (Non-Medical): No  Physical Activity:   . Days of Exercise per Week: Not on file  . Minutes of Exercise per Session: Not on file  Stress: No Stress Concern Present  . Feeling of Stress : Only a little  Social Connections: Unknown  . Frequency of Communication with Friends and Family: More than three times a week  . Frequency of Social Gatherings with Friends and Family: More than three times a week  . Attends Religious Services: Not on file  . Active Member of Clubs or Organizations: Not on file  . Attends Archivist Meetings: Not on file  . Marital Status: Not on file    Tobacco Counseling Ready to quit: Not Answered Counseling given: Not Answered Comment: "I WAS A CLOSET SMOKER AND NEVER SMOKED ALOT"   Clinical Intake:  Pre-visit preparation completed: Yes        Diabetes: Yes (Followed by pcp)  How often do you need to have someone help you when you read instructions, pamphlets, or other written materials from your doctor or pharmacy?: 1 - Never  Interpreter Needed?: No      Activities of Daily Living In your present state of health, do you  have any difficulty performing the following activities: 06/02/2020  Hearing? N  Vision? N  Difficulty concentrating or making decisions? N  Walking or climbing stairs? N  Dressing or bathing? N  Doing errands, shopping? N  Preparing Food and eating ? N  Using the Toilet? N  In the past six months, have you accidently leaked urine? N  Do you have problems with loss of bowel control? N  Managing your Medications? N  Managing your Finances? N  Housekeeping or managing your Housekeeping? N  Some recent data might be hidden  Patient Care Team: Burnard Hawthorne, FNP as PCP - General (Family Medicine)  Indicate any recent Medical Services you may have received from other than [facility] providers in the past year (date may be approximate).     Assessment:   This is a routine wellness examination for Kathy Bryant.  I connected with Kathy Bryant today by telephone and verified that I am speaking with the correct person using two identifiers. Location patient: home Location provider: work Persons participating in the virtual visit: patient, Marine scientist.    I discussed the limitations, risks, security and privacy concerns of performing an evaluation and management service by telephone and the availability of in person appointments. The patient expressed understanding and verbally consented to this telephonic visit.    Interactive audio and video telecommunications were attempted between this provider and patient, however failed, due to patient having technical difficulties OR patient did not have access to video capability.  We continued and completed visit with audio only.  Some vital signs may be absent or patient reported.   Hearing/Vision screen  Hearing Screening   125Hz  250Hz  500Hz  1000Hz  2000Hz  3000Hz  4000Hz  6000Hz  8000Hz   Right ear:           Left ear:           Comments: Patient is able to hear conversational tones without difficulty. No issues reported.   Vision Screening Comments: Wears  corrective lenses Visual acuity not assessed, virtual visit. They have seen their ophthalmologist in the last 12 months.   Dietary issues and exercise activities discussed: Current Exercise Habits: The patient does not participate in regular exercise at present Healthy diet Good water intake  Goals      Patient Stated   .  DIET - INCREASE WATER INTAKE (pt-stated)      Low carb foods    .  Increase physical activity (pt-stated)      Walk more for exercise Quit smoking      Depression Screen PHQ 2/9 Scores 06/02/2020 03/07/2019 06/12/2018 05/12/2018 05/02/2017 12/15/2016  PHQ - 2 Score 0 1 3 0 0 0  PHQ- 9 Score - - 9 - - -  Exception Documentation - - Medical reason - - -    Fall Risk Fall Risk  06/02/2020 03/11/2020 11/23/2019 03/07/2019 05/02/2017  Falls in the past year? 0 0 0 0 No  Number falls in past yr: 0 - - - -  Follow up Falls evaluation completed Falls evaluation completed Falls evaluation completed - -   Handrails in use when climbing stairs? Yes Home free of loose throw rugs in walkways, pet beds, electrical cords, etc? Yes  Adequate lighting in your home to reduce risk of falls? Yes   ASSISTIVE DEVICES UTILIZED TO PREVENT FALLS: Use of a cane, walker or w/c? No    TIMED UP AND GO:  Was the test performed? No . Virtual visit.   Cognitive Function:  Patient is alert and oriented x3.  Denies difficulty focusing, memory loss, making decisions.  Enjoys playing cards for brain health stimulation.    6CIT Screen 03/07/2019  What Year? 0 points  What month? 0 points  What time? 0 points  Count back from 20 0 points  Months in reverse 0 points    Immunizations Immunization History  Administered Date(s) Administered  . Hepb-cpg 08/18/2018, 09/20/2018  . Influenza-Unspecified 05/05/2011  . PFIZER SARS-COV-2 Vaccination 10/05/2019, 10/31/2019  . Pneumococcal Conjugate-13 11/30/2013  . Pneumococcal Polysaccharide-23 05/12/2018  . Td 11/30/2013  . Tdap 05/05/2011  .  Zoster 06/27/2012    Health Maintenance Health Maintenance  Topic Date Due  . FOOT EXAM  Never done  . OPHTHALMOLOGY EXAM  Never done  . DEXA SCAN  Never done  . MAMMOGRAM  11/22/2019  . HEMOGLOBIN A1C  09/11/2020  . URINE MICROALBUMIN  03/11/2021  . TETANUS/TDAP  12/01/2023  . COLONOSCOPY  04/26/2024  . COVID-19 Vaccine  Completed  . Hepatitis C Screening  Completed  . PNA vac Low Risk Adult  Completed  . INFLUENZA VACCINE  Discontinued    Dental Screening: Recommended annual dental exams for proper oral hygiene. Visits every 6 months.   Mammogram- number provided for scheduling Dexa Scan- number provided for scheduling  Foot exam- denies wounds, changes. Followed by pcp.   Community Resource Referral / Chronic Care Management: CRR required this visit?  No   CCM required this visit?  No      Plan:   Keep all routine maintenance appointments.   Encouraged to schedule annual eye exam.   I have personally reviewed and noted the following in the patient's chart:   . Medical and social history . Use of alcohol, tobacco or illicit drugs  . Current medications and supplements . Functional ability and status . Nutritional status . Physical activity . Advanced directives . List of other physicians . Hospitalizations, surgeries, and ER visits in previous 12 months . Vitals . Screenings to include cognitive, depression, and falls . Referrals and appointments  In addition, I have reviewed and discussed with patient certain preventive protocols, quality metrics, and best practice recommendations. A written personalized care plan for preventive services as well as general preventive health recommendations were provided to patient via mychart.     Varney Biles, LPN   [id number]     Agree with plan. [name], NP

## 2020-06-02 NOTE — Patient Instructions (Addendum)
Kathy Bryant , Thank you for taking time to come for your Medicare Wellness Visit. I appreciate your ongoing commitment to your health goals. Please review the following plan we discussed and let me know if I can assist you in the future.   These are the goals we discussed: Goals      Patient Stated   .  DIET - INCREASE WATER INTAKE (pt-stated)      Low carb foods    .  Increase physical activity (pt-stated)      Walk more for exercise Quit smoking       This is a list of the screening recommended for you and due dates:  Health Maintenance  Topic Date Due  . Complete foot exam   Never done  . Eye exam for diabetics  Never done  . DEXA scan (bone density measurement)  Never done  . Mammogram  11/22/2019  . Hemoglobin A1C  09/11/2020  . Urine Protein Check  03/11/2021  . Tetanus Vaccine  12/01/2023  . Colon Cancer Screening  04/26/2024  . COVID-19 Vaccine  Completed  .  Hepatitis C: One time screening is recommended by Center for Disease Control  (CDC) for  adults born from [birth year] through 1964.   Completed  . Pneumonia vaccines  Completed  . Flu Shot  Discontinued    Immunizations Immunization History  Administered Date(s) Administered  . Hepb-cpg 08/18/2018, 09/20/2018  . Influenza-Unspecified 05/05/2011  . PFIZER SARS-COV-2 Vaccination 10/05/2019, 10/31/2019  . Pneumococcal Conjugate-13 11/30/2013  . Pneumococcal Polysaccharide-23 05/12/2018  . Td 11/30/2013  . Tdap 05/05/2011  . Zoster 06/27/2012   Keep all routine maintenance appointments.   Schedule annual eye exam.   Advanced directives: not completed at this time.   Conditions/risks identified: none new.   Follow up in one year for your annual wellness visit.    Preventive Care 29 Years and Older, Female Preventive care refers to lifestyle choices and visits with your health care provider that can promote health and wellness. What does preventive care include?  A yearly physical exam. This is also  called an annual well check.  Dental exams once or twice a year.  Routine eye exams. Ask your health care provider how often you should have your eyes checked.  Personal lifestyle choices, including:  Daily care of your teeth and gums.  Regular physical activity.  Eating a healthy diet.  Avoiding tobacco and drug use.  Limiting alcohol use.  Practicing safe sex.  Taking low-dose aspirin every day.  Taking vitamin and mineral supplements as recommended by your health care provider. What happens during an annual well check? The services and screenings done by your health care provider during your annual well check will depend on your age, overall health, lifestyle risk factors, and family history of disease. Counseling  Your health care provider may ask you questions about your:  Alcohol use.  Tobacco use.  Drug use.  Emotional well-being.  Home and relationship well-being.  Sexual activity.  Eating habits.  History of falls.  Memory and ability to understand (cognition).  Work and work Statistician.  Reproductive health. Screening  You may have the following tests or measurements:  Height, weight, and BMI.  Blood pressure.  Lipid and cholesterol levels. These may be checked every 5 years, or more frequently if you are over 56 years old.  Skin check.  Lung cancer screening. You may have this screening every year starting at age 50 if you have a 30-pack-year history of  smoking and currently smoke or have quit within the past 15 years.  Fecal occult blood test (FOBT) of the stool. You may have this test every year starting at age 8.  Flexible sigmoidoscopy or colonoscopy. You may have a sigmoidoscopy every 5 years or a colonoscopy every 10 years starting at age 61.  Hepatitis C blood test.  Hepatitis B blood test.  Sexually transmitted disease (STD) testing.  Diabetes screening. This is done by checking your blood sugar (glucose) after you have not  eaten for a while (fasting). You may have this done every 1-3 years.  Bone density scan. This is done to screen for osteoporosis. You may have this done starting at age 11.  Mammogram. This may be done every 1-2 years. Talk to your health care provider about how often you should have regular mammograms. Talk with your health care provider about your test results, treatment options, and if necessary, the need for more tests. Vaccines  Your health care provider may recommend certain vaccines, such as:  Influenza vaccine. This is recommended every year.  Tetanus, diphtheria, and acellular pertussis (Tdap, Td) vaccine. You may need a Td booster every 10 years.  Zoster vaccine. You may need this after age 56.  Pneumococcal 13-valent conjugate (PCV13) vaccine. One dose is recommended after age 77.  Pneumococcal polysaccharide (PPSV23) vaccine. One dose is recommended after age 85. Talk to your health care provider about which screenings and vaccines you need and how often you need them. This information is not intended to replace advice given to you by your health care provider. Make sure you discuss any questions you have with your health care provider. Document Released: 08/22/2015 Document Revised: 04/14/2016 Document Reviewed: 05/27/2015 Elsevier Interactive Patient Education  2017 [place] Prevention in the Home Falls can cause injuries. They can happen to people of all ages. There are many things you can do to make your home safe and to help prevent falls. What can I do on the outside of my home?  Regularly fix the edges of walkways and driveways and fix any cracks.  Remove anything that might make you trip as you walk through a door, such as a raised step or threshold.  Trim any bushes or trees on the path to your home.  Use bright outdoor lighting.  Clear any walking paths of anything that might make someone trip, such as rocks or tools.  Regularly check to see if  handrails are loose or broken. Make sure that both sides of any steps have handrails.  Any raised decks and porches should have guardrails on the edges.  Have any leaves, snow, or ice cleared regularly.  Use sand or salt on walking paths during winter.  Clean up any spills in your garage right away. This includes oil or grease spills. What can I do in the bathroom?  Use night lights.  Install grab bars by the toilet and in the tub and shower. Do not use towel bars as grab bars.  Use non-skid mats or decals in the tub or shower.  If you need to sit down in the shower, use a plastic, non-slip stool.  Keep the floor dry. Clean up any water that spills on the floor as soon as it happens.  Remove soap buildup in the tub or shower regularly.  Attach bath mats securely with double-sided non-slip rug tape.  Do not have throw rugs and other things on the floor that can make you trip. What can  I do in the bedroom?  Use night lights.  Make sure that you have a light by your bed that is easy to reach.  Do not use any sheets or blankets that are too big for your bed. They should not hang down onto the floor.  Have a firm chair that has side arms. You can use this for support while you get dressed.  Do not have throw rugs and other things on the floor that can make you trip. What can I do in the kitchen?  Clean up any spills right away.  Avoid walking on wet floors.  Keep items that you use a lot in easy-to-reach places.  If you need to reach something above you, use a strong step stool that has a grab bar.  Keep electrical cords out of the way.  Do not use floor polish or wax that makes floors slippery. If you must use wax, use non-skid floor wax.  Do not have throw rugs and other things on the floor that can make you trip. What can I do with my stairs?  Do not leave any items on the stairs.  Make sure that there are handrails on both sides of the stairs and use them. Fix  handrails that are broken or loose. Make sure that handrails are as long as the stairways.  Check any carpeting to make sure that it is firmly attached to the stairs. Fix any carpet that is loose or worn.  Avoid having throw rugs at the top or bottom of the stairs. If you do have throw rugs, attach them to the floor with carpet tape.  Make sure that you have a light switch at the top of the stairs and the bottom of the stairs. If you do not have them, ask someone to add them for you. What else can I do to help prevent falls?  Wear shoes that:  Do not have high heels.  Have rubber bottoms.  Are comfortable and fit you well.  Are closed at the toe. Do not wear sandals.  If you use a stepladder:  Make sure that it is fully opened. Do not climb a closed stepladder.  Make sure that both sides of the stepladder are locked into place.  Ask someone to hold it for you, if possible.  Clearly mark and make sure that you can see:  Any grab bars or handrails.  First and last steps.  Where the edge of each step is.  Use tools that help you move around (mobility aids) if they are needed. These include:  Canes.  Walkers.  Scooters.  Crutches.  Turn on the lights when you go into a dark area. Replace any light bulbs as soon as they burn out.  Set up your furniture so you have a clear path. Avoid moving your furniture around.  If any of your floors are uneven, fix them.  If there are any pets around you, be aware of where they are.  Review your medicines with your doctor. Some medicines can make you feel dizzy. This can increase your chance of falling. Ask your doctor what other things that you can do to help prevent falls. This information is not intended to replace advice given to you by your health care provider. Make sure you discuss any questions you have with your health care provider. Document Released: 05/22/2009 Document Revised: 01/01/2016 Document Reviewed:  08/30/2014 Elsevier Interactive Patient Education  2017 [employer].

## 2020-06-11 ENCOUNTER — Other Ambulatory Visit: Payer: Medicare PPO

## 2020-06-12 ENCOUNTER — Other Ambulatory Visit: Payer: Self-pay | Admitting: Family

## 2020-06-12 DIAGNOSIS — Z780 Asymptomatic menopausal state: Secondary | ICD-10-CM

## 2020-06-16 ENCOUNTER — Other Ambulatory Visit: Payer: Self-pay

## 2020-06-16 ENCOUNTER — Ambulatory Visit: Payer: Medicare PPO | Admitting: Gastroenterology

## 2020-06-16 ENCOUNTER — Encounter: Payer: Self-pay | Admitting: Gastroenterology

## 2020-06-16 VITALS — BP 107/67 | HR 78 | Ht 63.5 in | Wt 195.6 lb

## 2020-06-16 DIAGNOSIS — K7460 Unspecified cirrhosis of liver: Secondary | ICD-10-CM | POA: Diagnosis not present

## 2020-06-16 NOTE — Progress Notes (Signed)
Primary Care Physician: Burnard Hawthorne, FNP  Primary Gastroenterologist:  Dr. Lucilla Lame  Chief Complaint  Patient presents with  . Follow-up Cirrhosis    HPI: Kathy Bryant is a 74 y.o. 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  The patient is here for her 40-month follow-up and was recently diagnosed on 20 September with Covid.  The patient's most recent lab work from August of this year showed liver enzymes that were not elevated.  The patient cirrhosis is thought to be caused by fatty liver.  The patient denies any issues at the present time.  She has been doing well without any abdominal pain nausea vomiting fevers chills black stools or bloody stools.  Past Medical History:  Diagnosis Date  . Anxiety   . Bilateral calf pain   . Diabetes mellitus without complication ([place])    PT STATES SHE WAS ON METFORMIN AND STOPPED TAKING THIS ON HER OWN  . Fatty liver   . 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 Herniated disc, cervical   . HTN (hypertension)    PT WAS ON BP MEDS AND THEN STATES HER BP WAS UNDER CONTROL AND SHE STOPPED TAKING BP ON HER OWN  . Microscopic hematuria   . Obesity   . Swelling of both lower extremities     Current Outpatient Medications  Medication Sig Dispense Refill  . ALPRAZolam (XANAX) 0.25 MG tablet Take 1 tablet (0.25 mg total) by mouth 2 (two) times daily as needed. for anxiety 30 tablet 1  . cyanocobalamin (,VITAMIN B-12,) 1000 MCG/ML injection 1000 mcg (1 mL) intramuscular injection in the thigh ( vastus lateralis) once per month. (Patient not taking: Reported on 04/28/2020) 1 mL 15  . pravastatin (PRAVACHOL) 20 MG tablet Take 1 tablet (20 mg total) by mouth once a week. (Patient not taking: Reported on 04/28/2020) 12 tablet 1   No current facility-administered medications for this visit.    Allergies as of 06/16/2020 - Review Complete 06/02/2020  Allergen Reaction Noted  . Simvastatin Other (See Comments) 10/25/2011    ROS:  General: Negative  for anorexia, weight loss, fever, chills, fatigue, weakness. ENT: Negative for hoarseness, difficulty swallowing , nasal congestion. CV: Negative for chest pain, angina, palpitations, dyspnea on exertion, peripheral edema.  Respiratory: Negative for dyspnea at rest, dyspnea on exertion, cough, sputum, wheezing.  GI: See history of present illness. GU:  Negative for dysuria, hematuria, urinary incontinence, urinary frequency, nocturnal urination.  Endo: Negative for unusual weight change.    Physical Examination:   BP 107/67   Pulse 78   Ht 5' 3.5" (1.613 m)   Wt 195 lb 9.6 oz (88.7 kg)   BMI 34.11 kg/m   General: Well-nourished, well-developed in no acute distress.  Eyes: No icterus. Conjunctivae pink. Lungs: Clear to auscultation bilaterally. Non-labored. Heart: Regular rate and rhythm, no murmurs rubs or gallops.  Abdomen: Bowel sounds are normal, nontender, nondistended, no hepatosplenomegaly or masses, no abdominal bruits or hernia , no rebound or guarding.   Extremities: No lower extremity edema. No clubbing or deformities. Neuro: Alert and oriented x 3.  Grossly intact. Skin: Warm and dry, no jaundice.   Psych: Alert and cooperative, normal mood and affect.  Labs:    Imaging Studies: No results found.  Assessment and Plan:   Kathy Bryant is a 74 y.o. y/o female who comes in today with a history of cirrhosis.  The most recent labs showed the patient's liver enzymes to be normal.  The patient will be set up for labs to check her alpha-fetoprotein and a right upper quadrant ultrasound for hepatocellular carcinoma surveillance.  The patient has been explained the plan and agrees with it.     Lucilla Lame, MD. [name]    Note: This dictation was prepared with Dragon dictation along with smaller phrase technology. Any transcriptional errors that result from this process are unintentional.

## 2020-06-18 ENCOUNTER — Other Ambulatory Visit: Payer: Self-pay

## 2020-06-18 ENCOUNTER — Ambulatory Visit
Admission: RE | Admit: 2020-06-18 | Discharge: 2020-06-18 | Disposition: A | Discharge status: Home / Self Care | Payer: Medicare PPO | Source: Ambulatory Visit | Attending: Family | Admitting: Family

## 2020-06-18 DIAGNOSIS — R921 Mammographic calcification found on diagnostic imaging of breast: Secondary | ICD-10-CM

## 2020-06-18 IMAGING — MG DIGITAL DIAGNOSTIC BILAT W/ TOMO W/ CAD
6 of 10 series · 6 of 26 positions shown · non-contrast
Comparison: Previous exam(s).

CLINICAL DATA: 73-year-old female presenting for annual exam as
well as follow-up of probably benign right breast calcifications
that were first evaluated in [DATE].

EXAM:
DIGITAL DIAGNOSTIC BILATERAL MAMMOGRAM WITH TOMO AND CAD

[R ML]
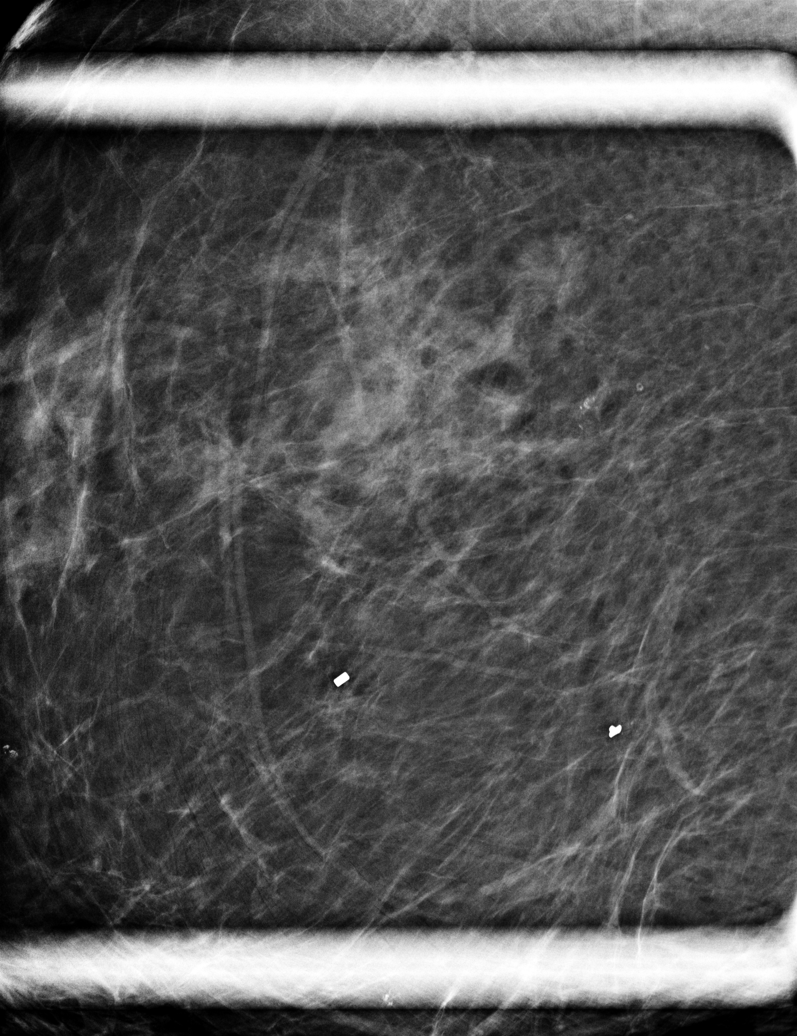

[R CC]
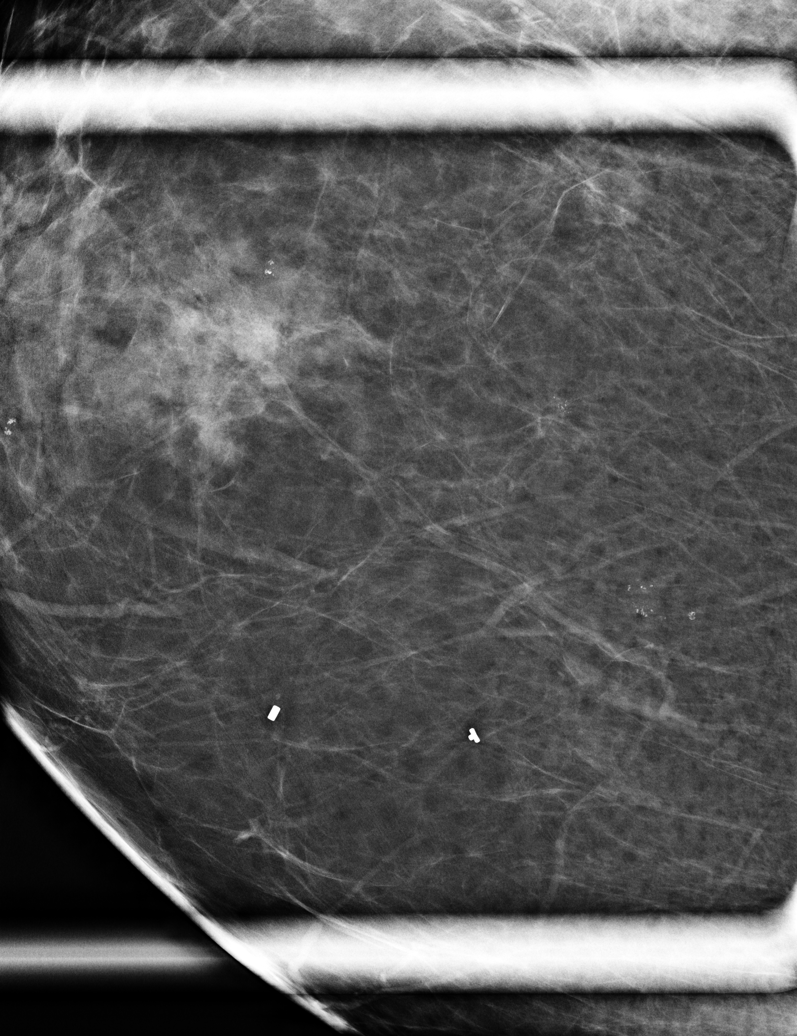

[R MLO synth-2D]
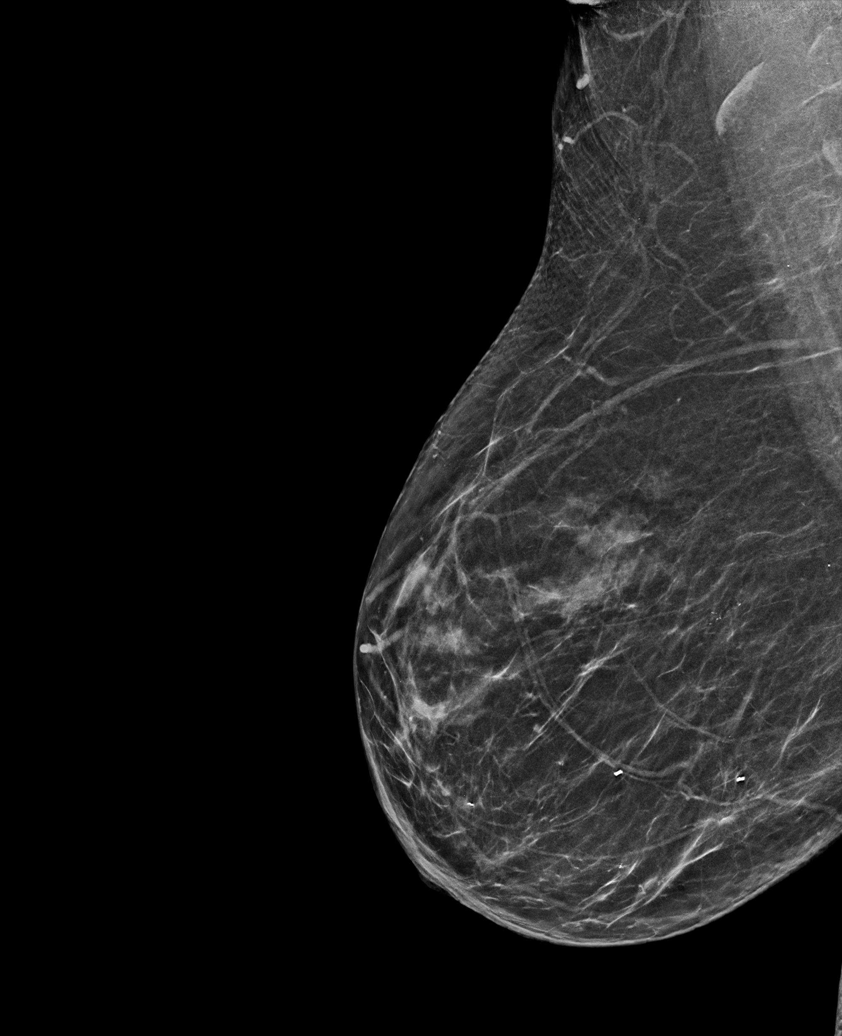

[R CC synth-2D]
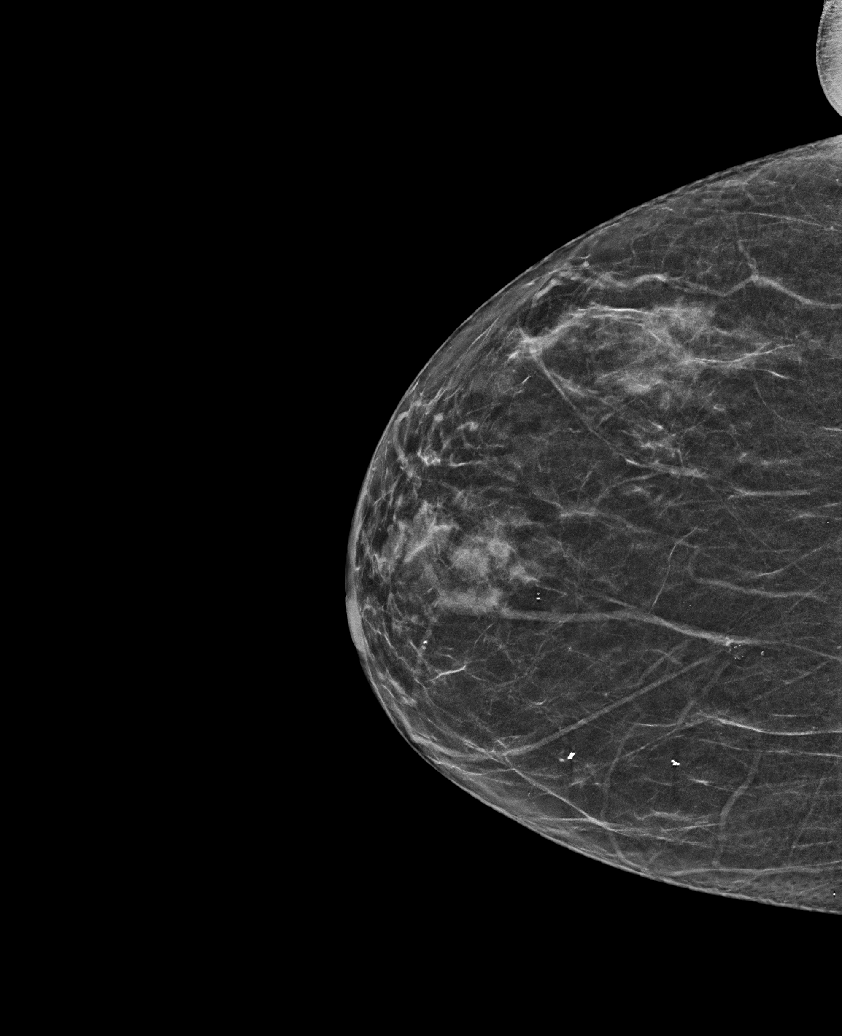

[L CC synth-2D]
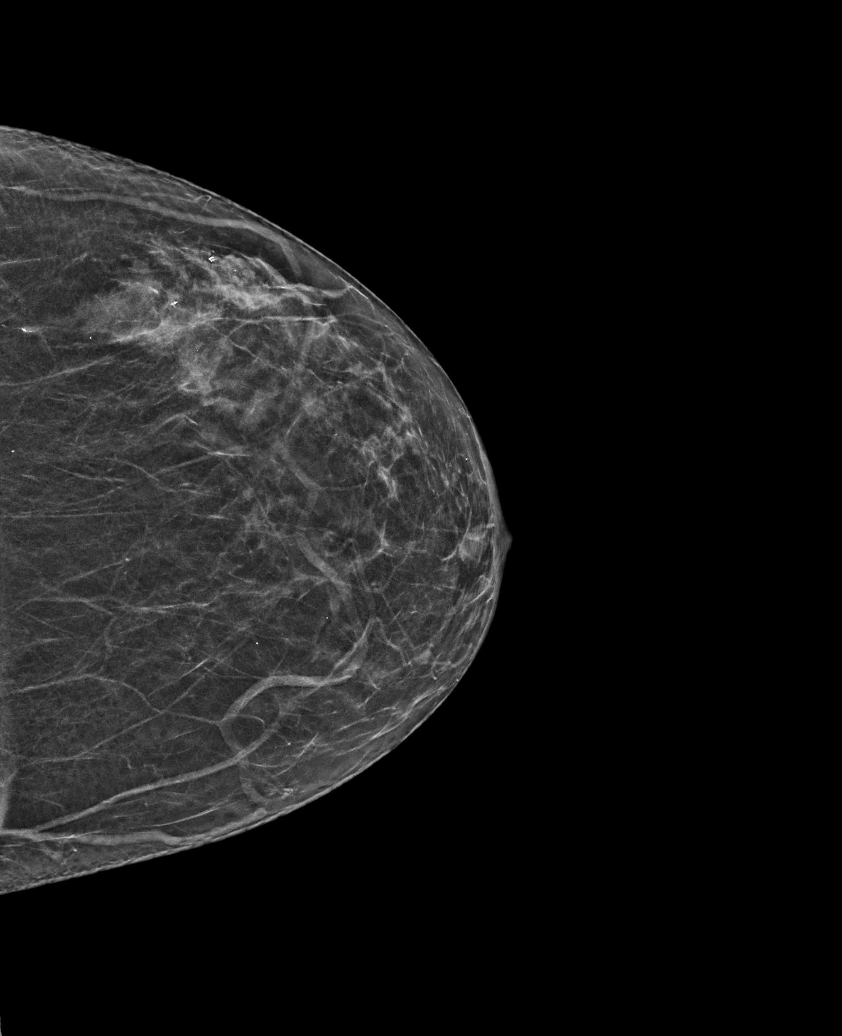

[L MLO synth-2D]
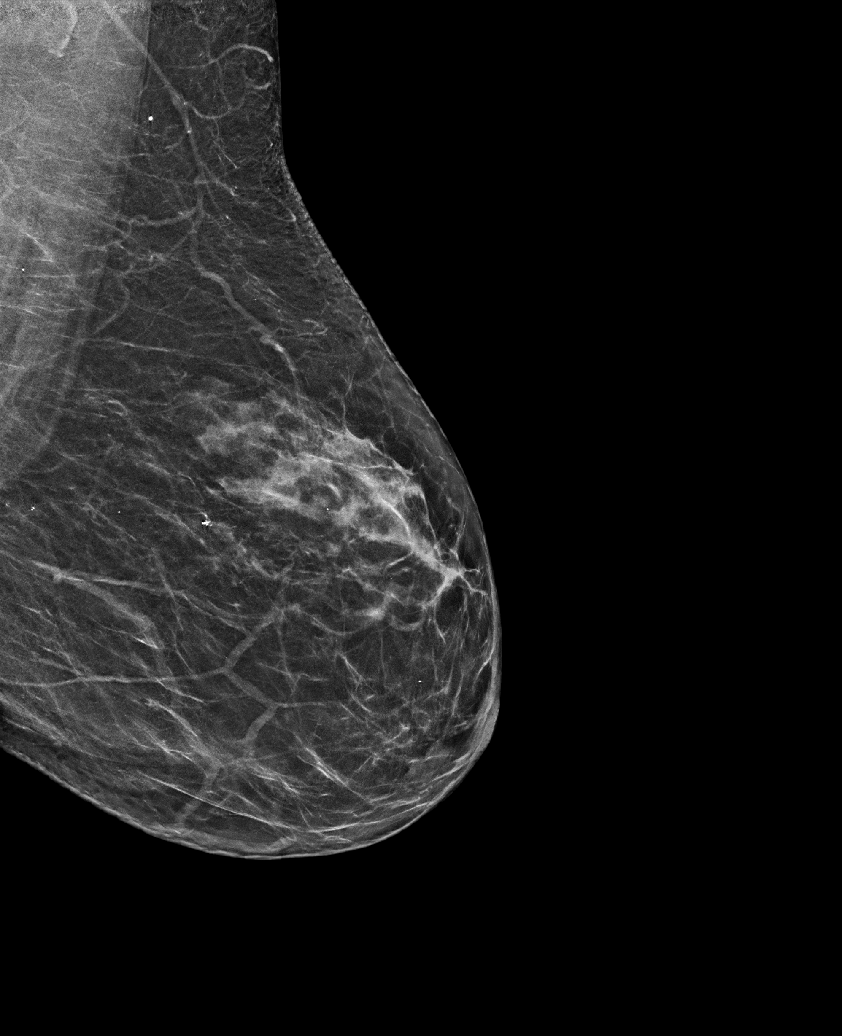

[6 of 26 positions shown; findings below may reference images not displayed]

ACR Breast Density Category b: There are scattered areas of
fibroglandular density.
FINDINGS: Right breast: Spot 2D magnification views were performed in addition
to standard views. There is a stable group of predominantly punctate
calcifications in the upper inner right breast posterior depth.
There is another similar appearing smaller group of calcifications
just lateral to this best seen on the spot cc view which have also
not significantly changed. No new asymmetry or associated mass. No
new findings elsewhere in the right breast.

Left breast: No suspicious mass, distortion, or microcalcifications
are identified to suggest presence of malignancy.

Mammographic images were processed with CAD.
IMPRESSION: 1. Stable grouped punctate calcifications in the upper inner right
breast. Given stability for over 2 years these are considered
benign.

2.  No mammographic evidence of malignancy in the bilateral breasts.

RECOMMENDATION:
Screening mammogram in one year.(Code:[WJ])

I have discussed the findings and recommendations with the patient.
If applicable, a reminder letter will be sent to the patient
regarding the next appointment.

BI-RADS CATEGORY  2: Benign.

## 2020-06-20 ENCOUNTER — Other Ambulatory Visit: Payer: Self-pay

## 2020-06-20 ENCOUNTER — Ambulatory Visit
Admission: RE | Admit: 2020-06-20 | Discharge: 2020-06-20 | Disposition: A | Discharge status: Home / Self Care | Payer: Medicare PPO | Source: Ambulatory Visit | Attending: Gastroenterology | Admitting: Gastroenterology

## 2020-06-20 DIAGNOSIS — K7460 Unspecified cirrhosis of liver: Secondary | ICD-10-CM | POA: Diagnosis not present

## 2020-06-20 DIAGNOSIS — K760 Fatty (change of) liver, not elsewhere classified: Secondary | ICD-10-CM | POA: Diagnosis not present

## 2020-06-20 IMAGING — US US ABDOMEN LIMITED
1 series · 14 of 25 positions shown · non-contrast
Comparison: [DATE]

CLINICAL DATA: Cirrhosis.  History of cholecystectomy

EXAM:
ULTRASOUND ABDOMEN LIMITED RIGHT UPPER QUADRANT

[Series 1: us abdomen limited · 0.26mm/px · 14 of 42 slices shown]
[im 1/42]
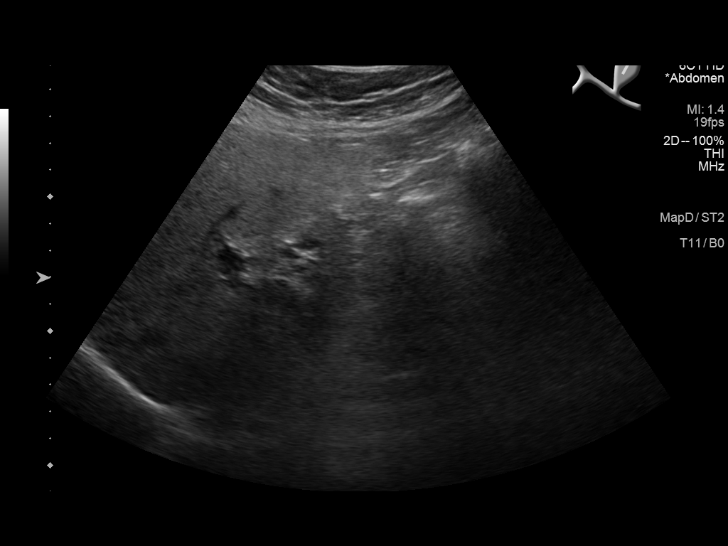
[im 4/42]
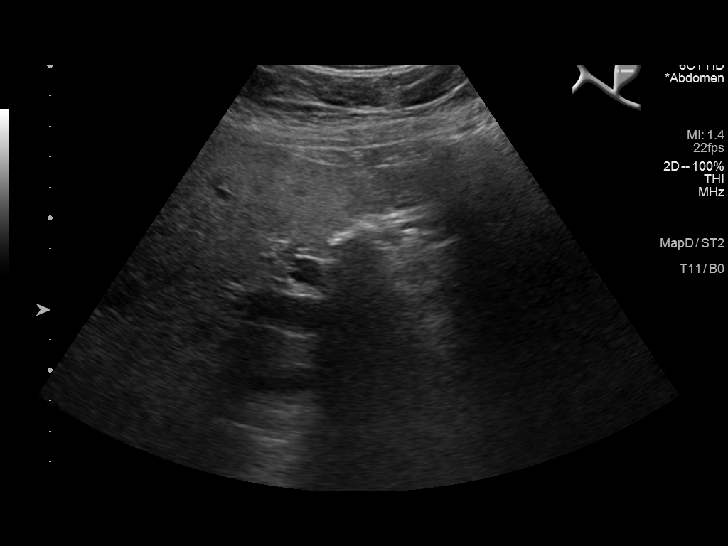
[im 7/42]
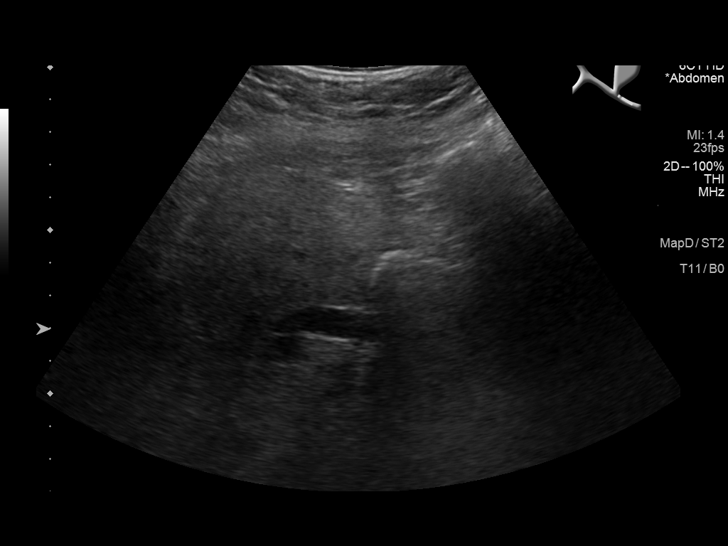
[im 11/42]
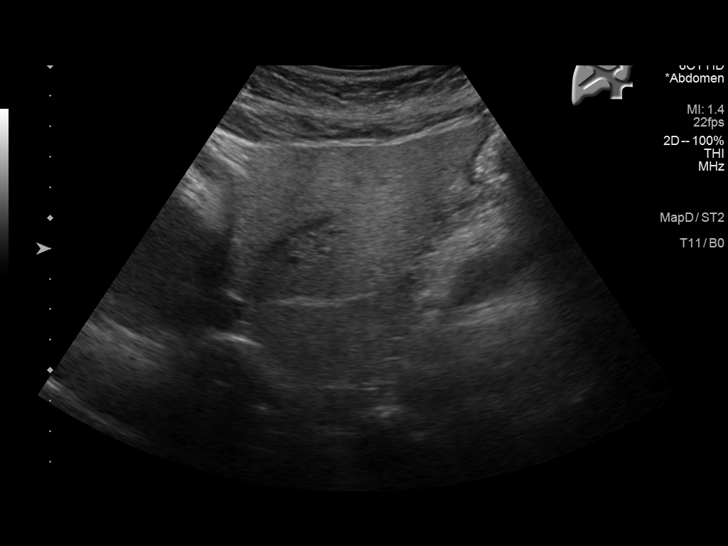
[im 14/42]
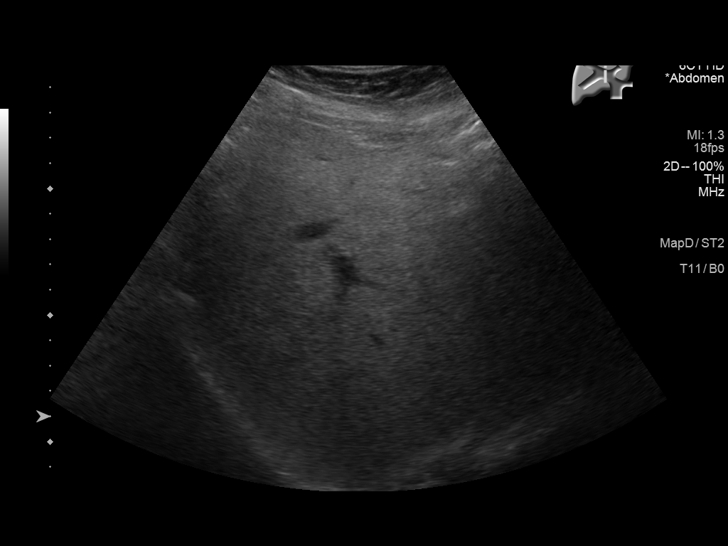
[im 16/42]
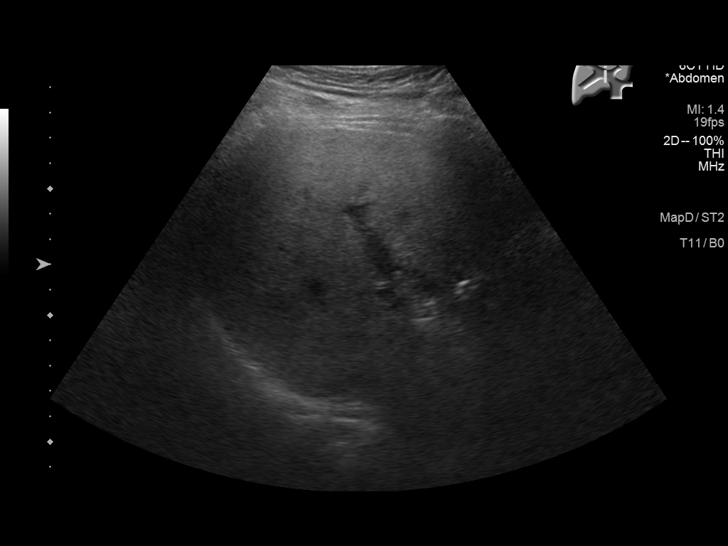
[im 19/42]
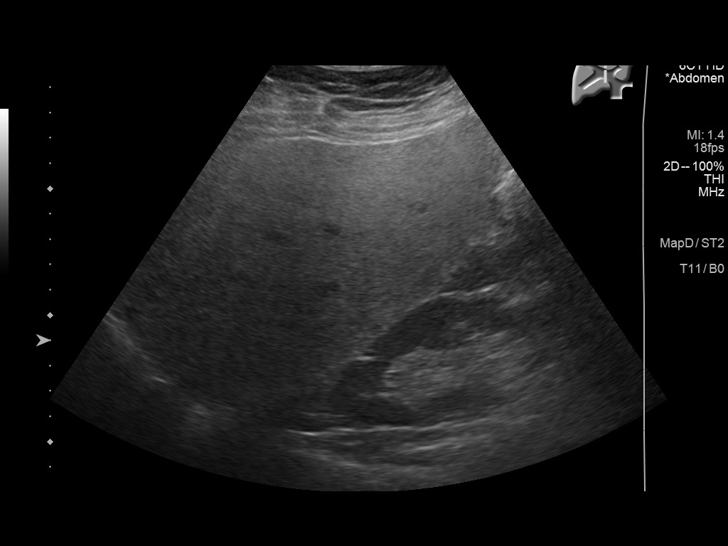
[im 23/42]
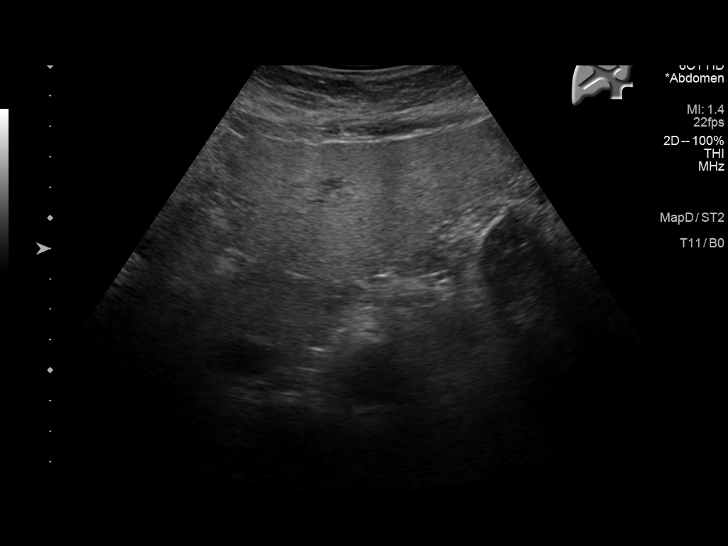
[im 26/42]
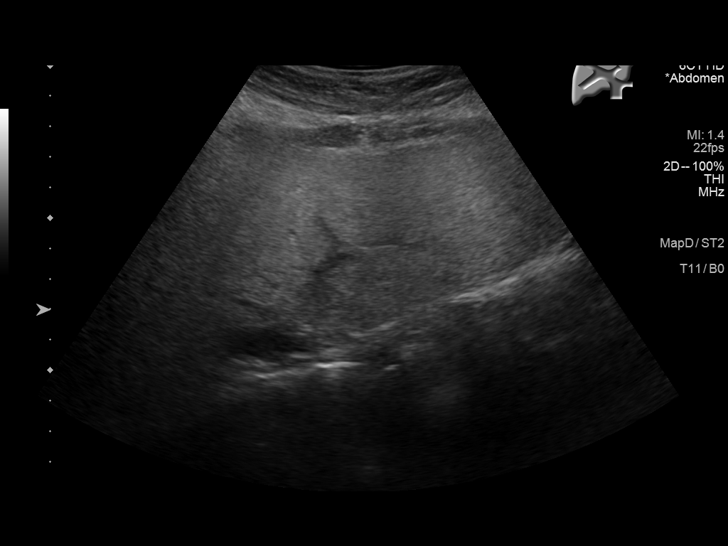
[im 28/42]
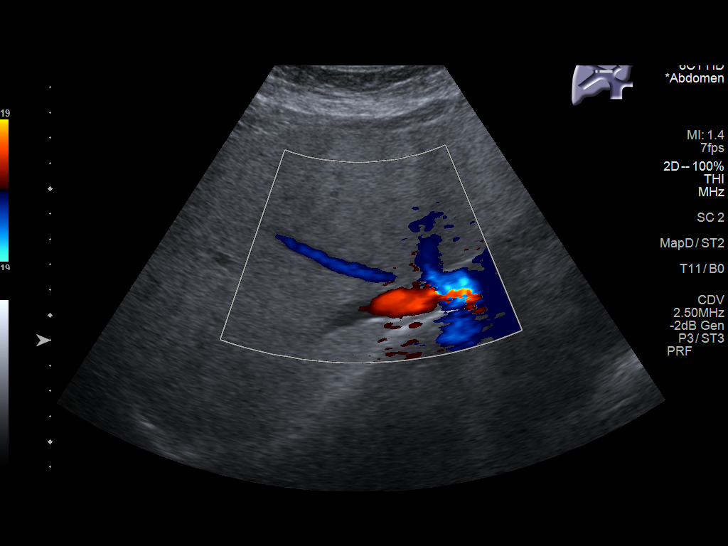
[im 31/42]
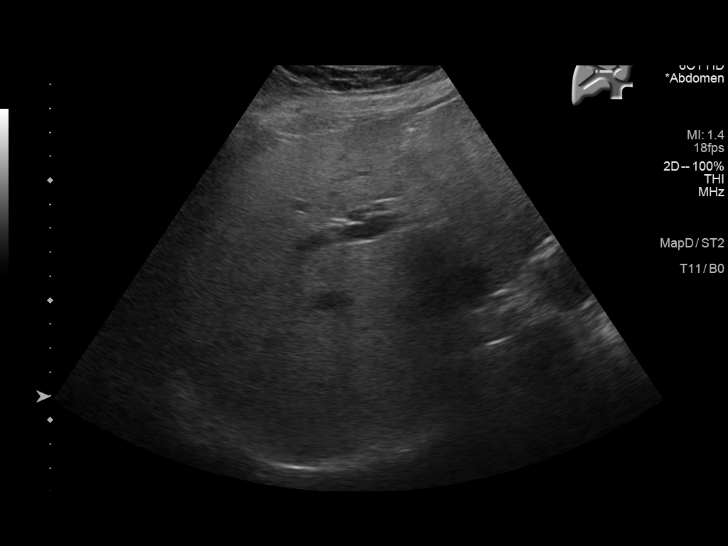
[im 35/42]
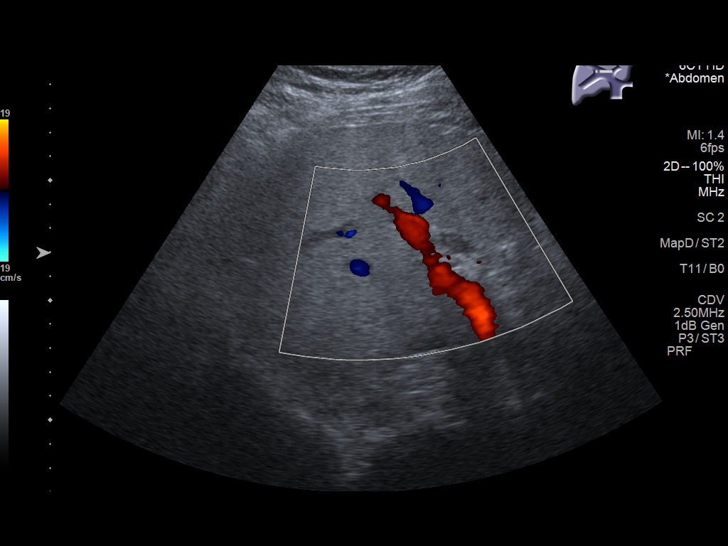
[im 38/42]
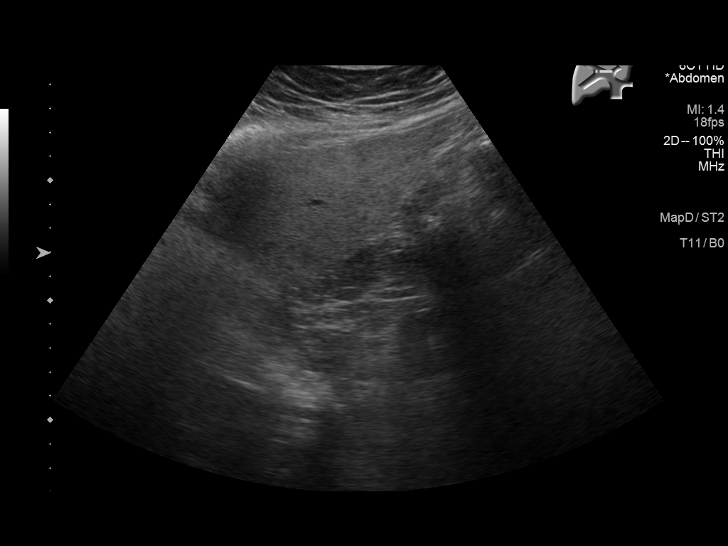
[im 42/42]
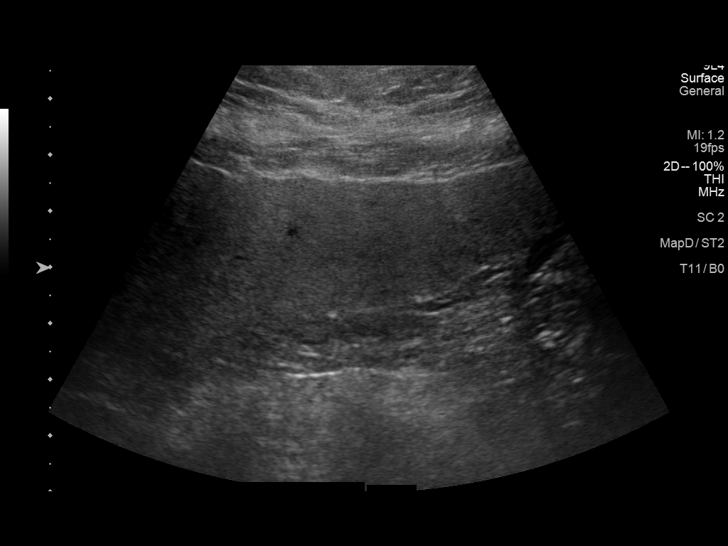

[14 of 25 positions shown; findings below may reference images not displayed]

FINDINGS: Gallbladder:

Surgically absent.

Common bile duct:

Diameter: 8 mm, unchanged from prior.

Liver:

No focal lesion identified. Diffusely increased hepatic parenchymal
echogenicity. Subtly nodular hepatic surface contour. Portal vein is
patent on color Doppler imaging with normal direction of blood flow
towards the liver.

Other: None.
IMPRESSION: 1. Findings of cirrhosis and hepatic steatosis. No focal liver
lesion identified sonographically.
2. Status post cholecystectomy.

## 2020-06-23 ENCOUNTER — Telehealth: Payer: Self-pay

## 2020-06-23 NOTE — Telephone Encounter (Signed)
-----   Message from Lucilla Lame, MD sent at 06/20/2020  4:57 PM EST ----- Let the patient know that the ultrasound showed cirrhosis as previously known without any sign of any masses or tumors.  Repeat in 6 months

## 2020-06-23 NOTE — Telephone Encounter (Signed)
Patient verbalized understanding. Sent staff message to ginger to repeat in 6 months delayed send for 5 months

## 2020-07-22 ENCOUNTER — Ambulatory Visit
Admission: RE | Admit: 2020-07-22 | Discharge: 2020-07-22 | Disposition: A | Discharge status: Home / Self Care | Payer: Medicare PPO | Source: Ambulatory Visit | Attending: Family | Admitting: Family

## 2020-07-22 ENCOUNTER — Other Ambulatory Visit: Payer: Self-pay

## 2020-07-22 DIAGNOSIS — Z8719 Personal history of other diseases of the digestive system: Secondary | ICD-10-CM | POA: Diagnosis not present

## 2020-07-22 DIAGNOSIS — Z90710 Acquired absence of both cervix and uterus: Secondary | ICD-10-CM | POA: Diagnosis not present

## 2020-07-22 DIAGNOSIS — Z780 Asymptomatic menopausal state: Secondary | ICD-10-CM

## 2020-09-25 ENCOUNTER — Other Ambulatory Visit: Payer: Self-pay

## 2020-09-25 ENCOUNTER — Telehealth (INDEPENDENT_AMBULATORY_CARE_PROVIDER_SITE_OTHER): Payer: Medicare PPO | Admitting: Family Medicine

## 2020-09-25 ENCOUNTER — Encounter: Payer: Self-pay | Admitting: Family Medicine

## 2020-09-25 DIAGNOSIS — B029 Zoster without complications: Secondary | ICD-10-CM | POA: Insufficient documentation

## 2020-09-25 MED ORDER — VALACYCLOVIR HCL 1 G PO TABS: 1000.0000 mg | ORAL_TABLET | Freq: Three times a day (TID) | ORAL | 0 refills | Status: DC

## 2020-09-25 NOTE — Assessment & Plan Note (Signed)
Rash and pain seem consistent with shingles based on description.  We will treat with Valtrex.  Advised to keep the area covered.  Advised to avoid contact with anyone who is not vaccinated against chickenpox or has not had chickenpox, is pregnant, or is immunosuppressed.  She has a grandchild who was born about a week or so ago and I advised that she needed to avoid them until the rash has crusted over.  Discussed that she would not be able to give anyone shingles though she could give them chickenpox.  She wondered about her husband seeing the baby and I advised that as long as he is not coming in contact with the fluid from the lesions or her and has had chickenpox then he would not have to stay out of contact with anyone else.  She can continue Advil 600 mg every 8 hours as needed for her discomfort.  If her pain is not improving over the next week or so she will let us know.  If it worsens she will contact us.

## 2020-09-25 NOTE — Progress Notes (Signed)
Virtual Visit via video Note  This visit type was conducted due to national recommendations for restrictions regarding the COVID-19 pandemic (e.g. social distancing).  This format is felt to be most appropriate for this patient at this time.  All issues noted in this document were discussed and addressed.  No physical exam was performed (except for noted visual exam findings with Video Visits).   I connected with Kathy Bryant today at 11:30 AM EST by a video enabled telemedicine application or telephone and verified that I am speaking with the correct person using two identifiers. Location patient: work Location provider: work  Persons participating in the virtual visit: patient, provider  I discussed the limitations, risks, security and privacy concerns of performing an evaluation and management service by telephone and the availability of in person appointments. I also discussed with the patient that there may be a patient responsible charge related to this service. The patient expressed understanding and agreed to proceed.  Reason for visit: Same-day visit  HPI: Rash: This started 2 days ago.  She been having some right back pain for the last couple of weeks and she thought it was a pulled muscle.  She been taking some Advil for this.  The rash appeared 2 days ago.  It is in 3 separate levels though is weeping fluid.  She notes the rash is hypersensitive.  She had Zostavax though has not had the Shingrix vaccine.  Advil has been beneficial for her pain.   ROS: See pertinent positives and negatives per HPI.  Past Medical History:  Diagnosis Date  . Anxiety   . Bilateral calf pain   . Diabetes mellitus without complication ([place])    PT STATES SHE WAS ON METFORMIN AND STOPPED TAKING THIS ON HER OWN  . Fatty liver   . 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 Herniated disc, cervical   . HTN (hypertension)    PT WAS ON BP MEDS AND THEN STATES HER BP WAS UNDER CONTROL AND SHE STOPPED TAKING BP ON HER  OWN  . Microscopic hematuria   . Obesity   . Swelling of both lower extremities     Past Surgical History:  Procedure Laterality Date  . APPENDECTOMY  1968  . BREAST BIOPSY Right 2013   x2 BENIGN BREAST TISSUE WITH FOCAL HYALINIZED STROMA AND BENIGN BREAST TISSUE WITH FOCAL FIBROADENOMATOUS CHANGE   . [place]  . CHOLECYSTECTOMY    . COLONOSCOPY  03/05/2014  . COLONOSCOPY WITH PROPOFOL N/A 04/27/2019   Procedure: COLONOSCOPY WITH PROPOFOL;  Surgeon: Lucilla Lame, MD;  Location: [facility];  Service: Endoscopy;  Laterality: N/A;  . ESOPHAGOGASTRODUODENOSCOPY (EGD) WITH PROPOFOL N/A 04/27/2019   Procedure: ESOPHAGOGASTRODUODENOSCOPY (EGD) WITH PROPOFOL;  Surgeon: Lucilla Lame, MD;  Location: [facility];  Service: Endoscopy;  Laterality: N/A;  . [place]  . GASTRIC RESTRICTION SURGERY  1980   'stomach stapled'  . HEMORRHOID SURGERY N/A 12/23/2016   Procedure: HEMORRHOIDECTOMY;  Surgeon: Christene Lye, MD;  Location: [facility];  Service: General;  Laterality: N/A;  . HERNIA REPAIR     Abdominal  . POLYPECTOMY N/A 04/27/2019   Procedure: POLYPECTOMY;  Surgeon: Lucilla Lame, MD;  Location: [facility];  Service: Endoscopy;  Laterality: N/A;  . TOTAL ABDOMINAL HYSTERECTOMY W/ BILATERAL SALPINGOOPHORECTOMY  1998    Family History  Problem Relation Age of Onset  . Diabetes Mother   . Cancer Mother 37  Brain Tumor - died age 6  . Heart disease Father        CAD - died MI - age 28  . Hypertension Father   . High Cholesterol Father   . Diabetes Sister   . Diabetes Brother   . Diabetes Brother   . Cancer Paternal Aunt        Renal Cell Ca  . Cancer Paternal Uncle        Renal Cell Ca  . Breast cancer Neg Hx     SOCIAL HX: Smoker   Current Outpatient Medications:  .  ALPRAZolam (XANAX) 0.25 MG tablet, Take 1 tablet (0.25 mg total) by mouth 2 (two) times daily as needed. for anxiety, Disp: 30 tablet, Rfl:  1 .  cyanocobalamin (,VITAMIN B-12,) 1000 MCG/ML injection, 1000 mcg (1 mL) intramuscular injection in the thigh ( vastus lateralis) once per month., Disp: 1 mL, Rfl: 15 .  pravastatin (PRAVACHOL) 20 MG tablet, Take 1 tablet (20 mg total) by mouth once a week., Disp: 12 tablet, Rfl: 1 .  valACYclovir (VALTREX) 1000 MG tablet, Take 1 tablet (1,000 mg total) by mouth 3 (three) times daily., Disp: 21 tablet, Rfl: 0  EXAM:  VITALS per patient if applicable:  GENERAL: alert, oriented, appears well and in no acute distress  HEENT: atraumatic, conjunttiva clear, no obvious abnormalities on inspection of external nose and ears  NECK: normal movements of the head and neck  LUNGS: on inspection no signs of respiratory distress, breathing rate appears normal, no obvious gross SOB, gasping or wheezing  CV: no obvious cyanosis  MS: moves all visible extremities without noticeable abnormality  PSYCH/NEURO: pleasant and cooperative, no obvious depression or anxiety, speech and thought processing grossly intact  ASSESSMENT AND PLAN:  Discussed the following assessment and plan:  Problem List Items Addressed This Visit    Shingles    Rash and pain seem consistent with shingles based on description.  We will treat with Valtrex.  Advised to keep the area covered.  Advised to avoid contact with anyone who is not vaccinated against chickenpox or has not had chickenpox, is pregnant, or is immunosuppressed.  She has a grandchild who was born about a week or so ago and I advised that she needed to avoid them until the rash has crusted over.  Discussed that she would not be able to give anyone shingles though she could give them chickenpox.  She wondered about her husband seeing the baby and I advised that as long as he is not coming in contact with the fluid from the lesions or her and has had chickenpox then he would not have to stay out of contact with anyone else.  She can continue Advil 600 mg every 8 hours  as needed for her discomfort.  If her pain is not improving over the next week or so she will let us know.  If it worsens she will contact us.      Relevant Medications   valACYclovir (VALTREX) 1000 MG tablet       I discussed the assessment and treatment plan with the patient. The patient was provided an opportunity to ask questions and all were answered. The patient agreed with the plan and demonstrated an understanding of the instructions.   The patient was advised to call back or seek an in-person evaluation if the symptoms worsen or if the condition fails to improve as anticipated.   Tommi Rumps, MD

## 2020-09-30 ENCOUNTER — Telehealth: Payer: Self-pay | Admitting: Family

## 2020-09-30 NOTE — Telephone Encounter (Signed)
Patient had a virtual with Dr. Caryl Bis on 09/25/20 for Shingles. She still has the spots, but would like to know when she can go visit her infant grandchild.

## 2020-10-01 NOTE — Telephone Encounter (Signed)
Call pt  Infants are very high risk to develop chicken pox from shingles.  She should not see infant until lesions are completely dried and healed. I would also add that pain,, sensitivity of area has largely resolved. I would exercise great caution and not see infant until all the above criteria is met

## 2020-10-01 NOTE — Telephone Encounter (Signed)
Patient was notified.

## 2020-12-05 DIAGNOSIS — H25813 Combined forms of age-related cataract, bilateral: Secondary | ICD-10-CM | POA: Diagnosis not present

## 2020-12-05 DIAGNOSIS — H524 Presbyopia: Secondary | ICD-10-CM | POA: Diagnosis not present

## 2020-12-05 DIAGNOSIS — H5203 Hypermetropia, bilateral: Secondary | ICD-10-CM | POA: Diagnosis not present

## 2020-12-12 ENCOUNTER — Other Ambulatory Visit: Payer: Self-pay

## 2020-12-12 DIAGNOSIS — K7460 Unspecified cirrhosis of liver: Secondary | ICD-10-CM

## 2021-01-14 DIAGNOSIS — H25811 Combined forms of age-related cataract, right eye: Secondary | ICD-10-CM | POA: Diagnosis not present

## 2021-01-14 DIAGNOSIS — H25012 Cortical age-related cataract, left eye: Secondary | ICD-10-CM | POA: Diagnosis not present

## 2021-01-14 DIAGNOSIS — H2512 Age-related nuclear cataract, left eye: Secondary | ICD-10-CM | POA: Diagnosis not present

## 2021-01-21 DIAGNOSIS — H2511 Age-related nuclear cataract, right eye: Secondary | ICD-10-CM | POA: Diagnosis not present

## 2021-01-21 DIAGNOSIS — H25011 Cortical age-related cataract, right eye: Secondary | ICD-10-CM | POA: Diagnosis not present

## 2021-01-29 ENCOUNTER — Ambulatory Visit
Admission: RE | Admit: 2021-01-29 | Discharge: 2021-01-29 | Disposition: A | Discharge status: Home / Self Care | Payer: Medicare PPO | Source: Ambulatory Visit | Attending: Gastroenterology | Admitting: Gastroenterology

## 2021-01-29 ENCOUNTER — Other Ambulatory Visit: Payer: Self-pay

## 2021-01-29 DIAGNOSIS — K7460 Unspecified cirrhosis of liver: Secondary | ICD-10-CM | POA: Diagnosis not present

## 2021-01-29 DIAGNOSIS — K760 Fatty (change of) liver, not elsewhere classified: Secondary | ICD-10-CM | POA: Diagnosis not present

## 2021-01-29 IMAGING — US US ABDOMEN LIMITED
1 series · 15 of 25 positions shown · non-contrast
Comparison: Ultrasound abdomen [DATE], ultrasound abdomen
[DATE], ultrasound abdomen [DATE]

CLINICAL DATA: Follow-up cirrhosis.  Status post cholecystectomy.

EXAM:
ULTRASOUND ABDOMEN LIMITED RIGHT UPPER QUADRANT

[Series 1: us abdomen limited ruq · 15 of 37 slices shown]
[im 1/37]
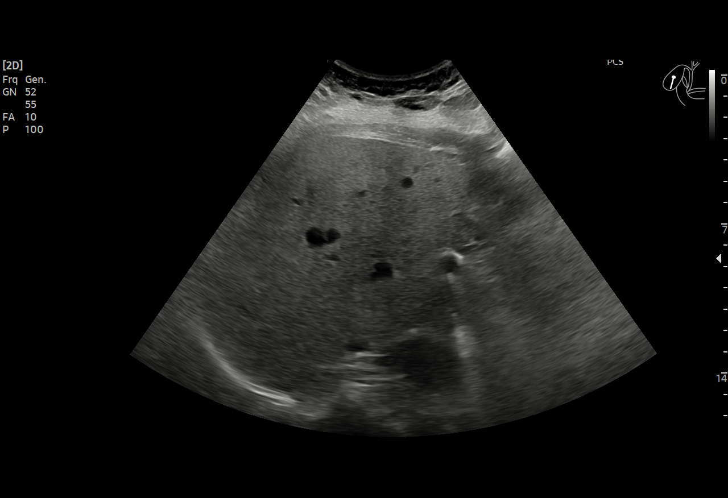
[im 4/37]
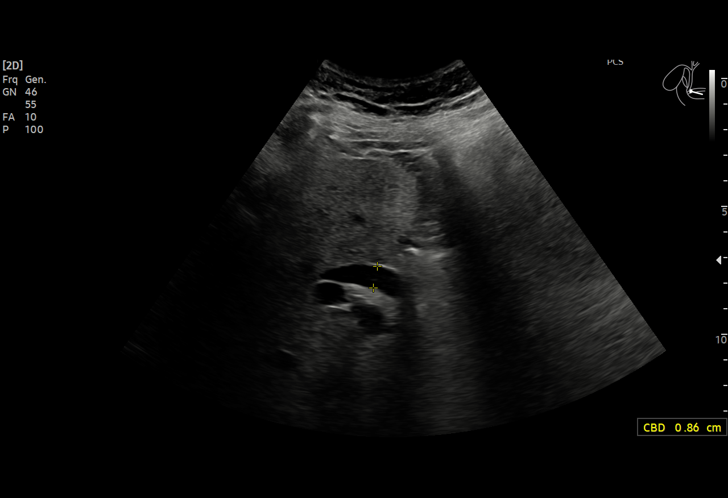
[im 7/37]
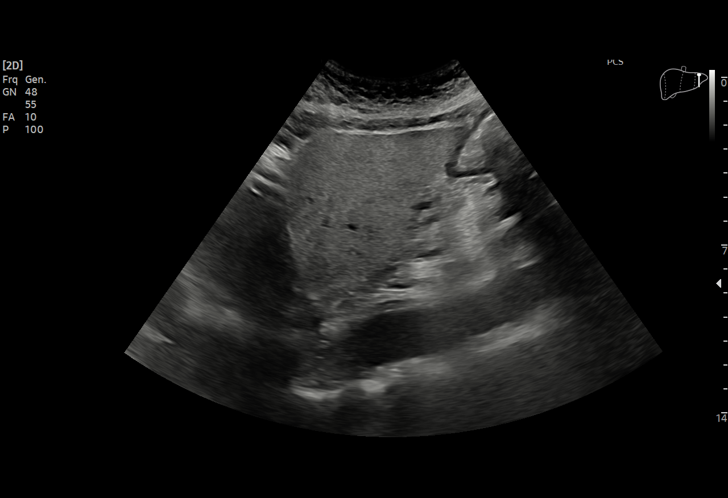
[im 8/37]
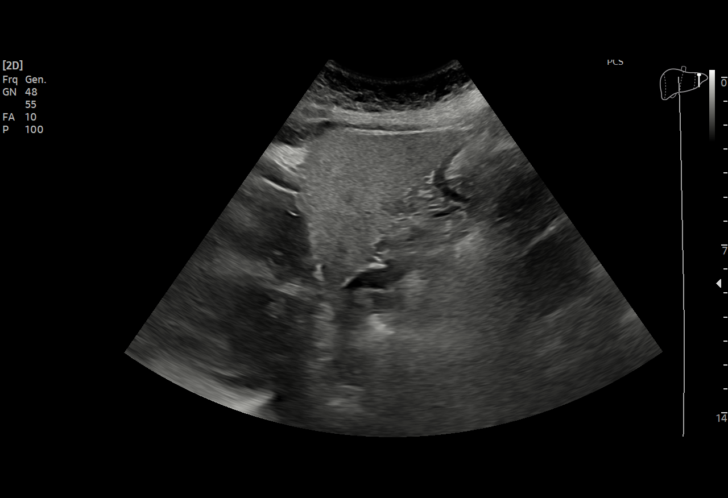
[im 11/37]
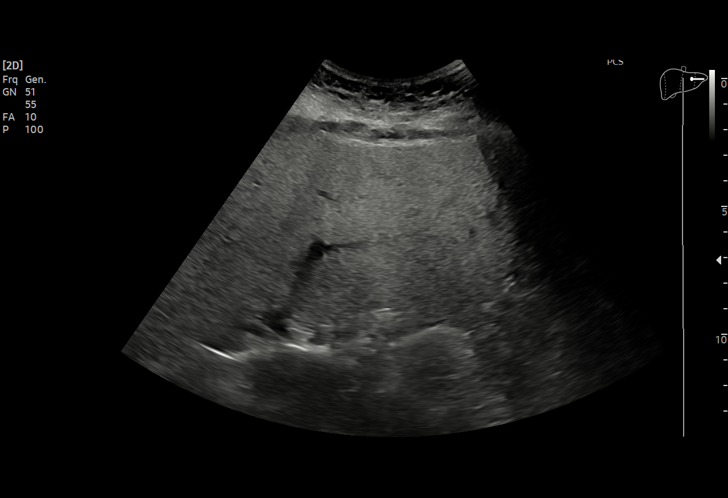
[im 14/37]
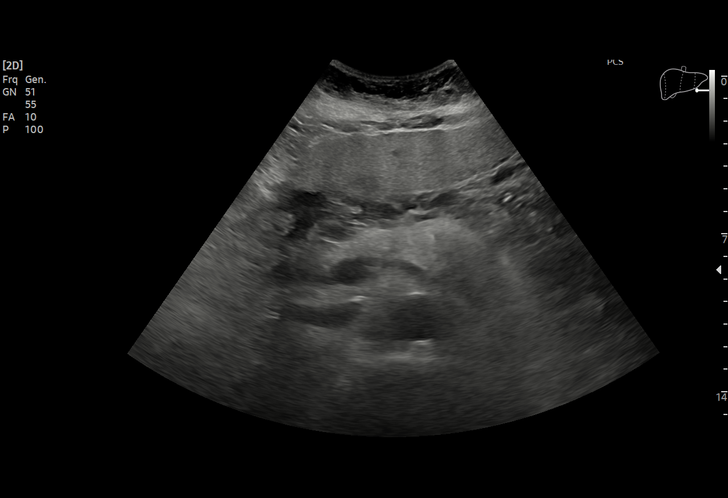
[im 16/37]
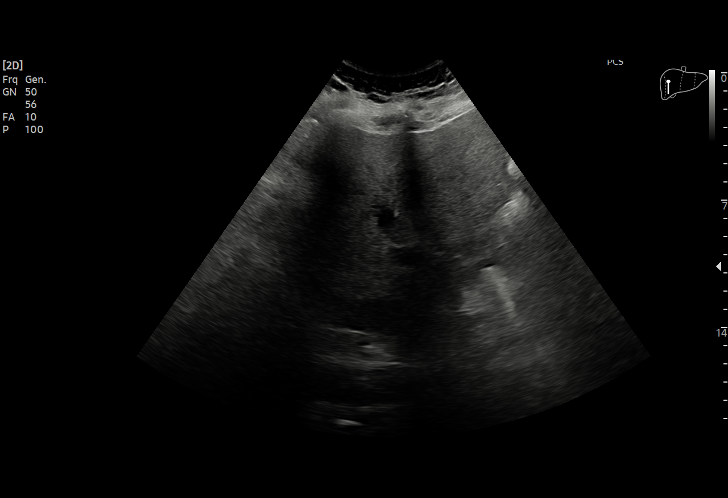
[im 19/37]
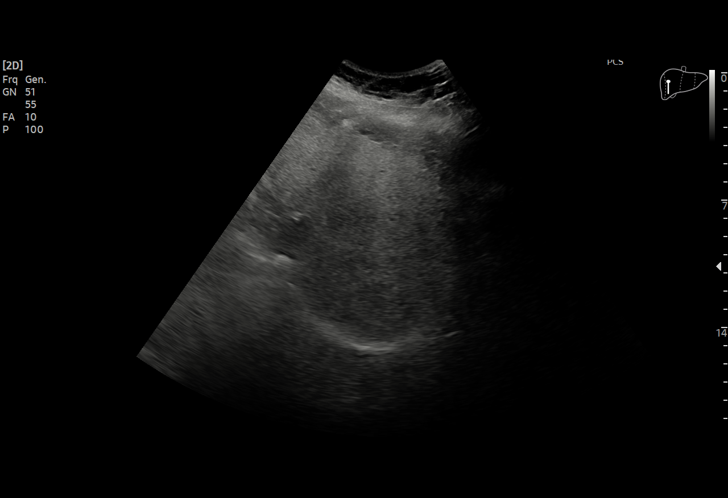
[im 22/37]
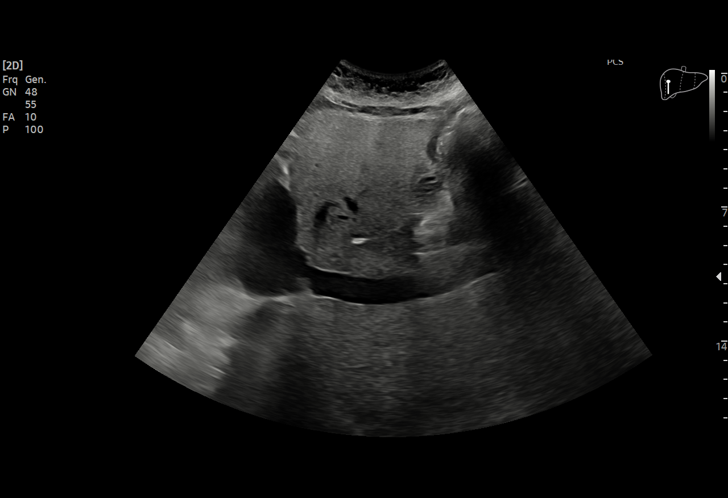
[im 23/37]
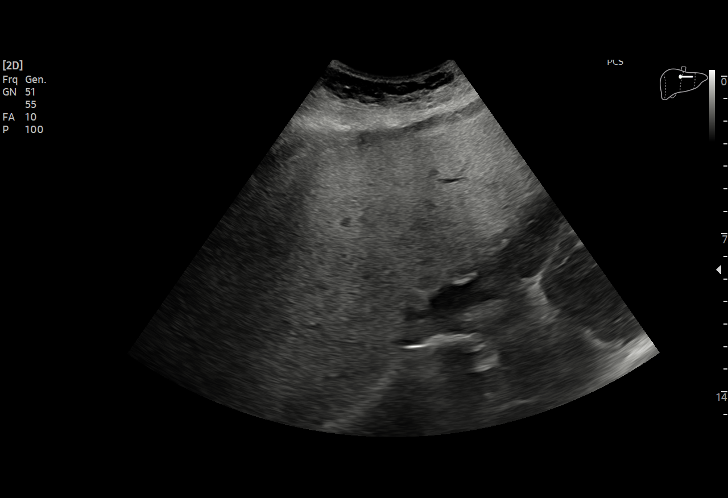
[im 26/37]
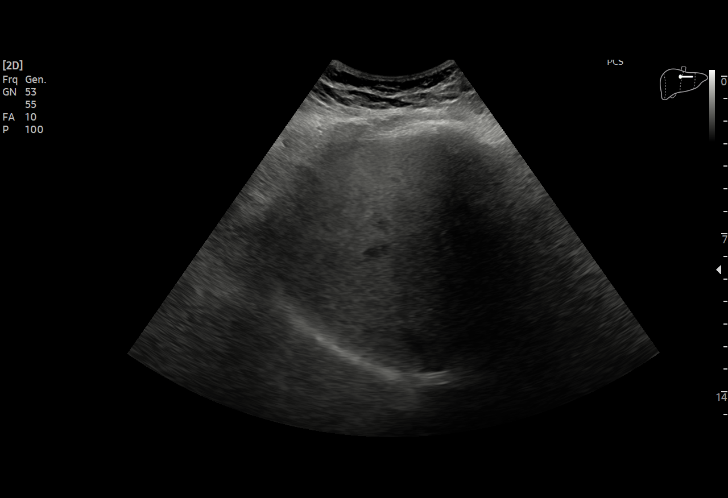
[im 29/37]
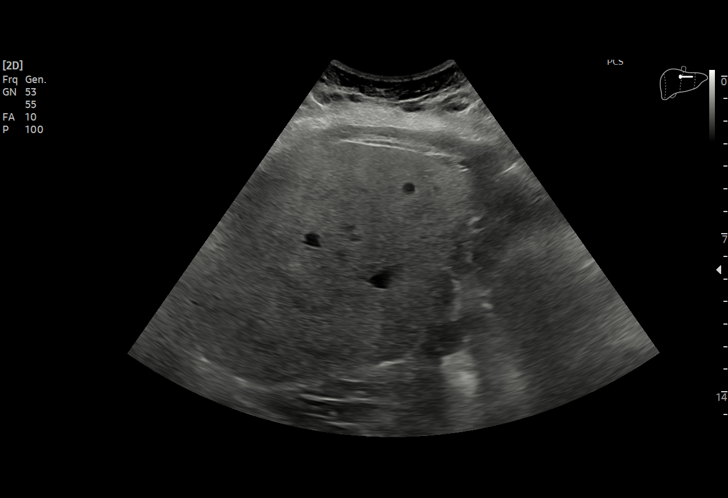
[im 31/37]
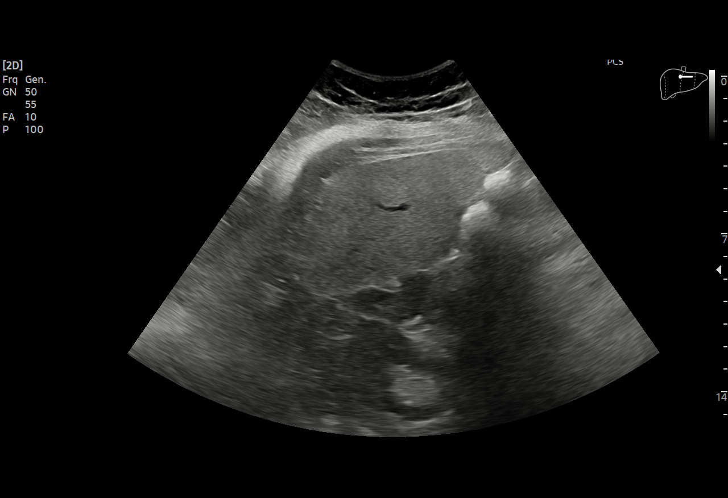
[im 34/37]
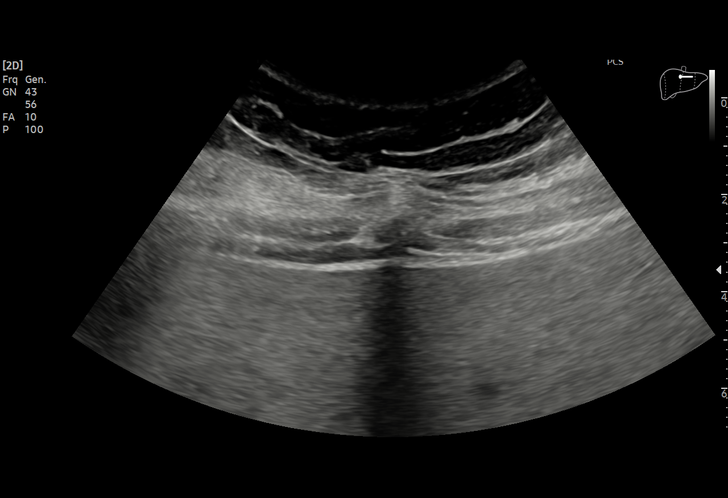
[im 37/37]
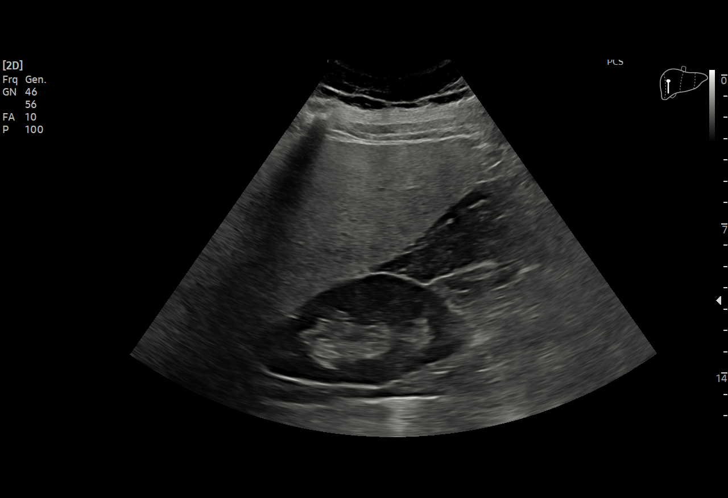

[15 of 25 positions shown; findings below may reference images not displayed]

FINDINGS: Gallbladder:

Status post cholecystectomy.

Common bile duct:

Diameter: 9 mm

Liver:

No focal lesion identified. Trace nodular contour. Increased
parenchymal echogenicity. Portal vein is patent on color Doppler
imaging with normal direction of blood flow towards the liver.

Other: None.
IMPRESSION: Cirrhosis and hepatic steatosis. Please note limited evaluation for
focal hepatic masses in a patient with hepatic steatosis due to
decreased penetration of the acoustic ultrasound waves. Recommend
MRI liver protocol for further evaluation.

## 2021-02-03 ENCOUNTER — Other Ambulatory Visit: Payer: Self-pay

## 2021-02-03 ENCOUNTER — Telehealth: Payer: Self-pay

## 2021-02-03 DIAGNOSIS — K760 Fatty (change of) liver, not elsewhere classified: Secondary | ICD-10-CM

## 2021-02-03 NOTE — Telephone Encounter (Signed)
Pt notified of [place] results and order has been placed for MRI liver with [facility] per pt request due to her claustrophobia.

## 2021-02-03 NOTE — Telephone Encounter (Signed)
-----   Message from Lucilla Lame, MD sent at 02/02/2021  1:01 PM EDT ----- Let the patient know that the ultrasound did not show anything worrisome but the radiologist said that due to the amount of fatty liver it made the sensitivity of the test low and they recommended an MRI of the liver to rule out any masses.

## 2021-02-22 ENCOUNTER — Ambulatory Visit
Admission: RE | Admit: 2021-02-22 | Discharge: 2021-02-22 | Disposition: A | Discharge status: Home / Self Care | Payer: Medicare PPO | Source: Ambulatory Visit | Attending: Gastroenterology | Admitting: Gastroenterology

## 2021-02-22 DIAGNOSIS — K760 Fatty (change of) liver, not elsewhere classified: Secondary | ICD-10-CM

## 2021-02-22 DIAGNOSIS — N281 Cyst of kidney, acquired: Secondary | ICD-10-CM | POA: Diagnosis not present

## 2021-02-22 DIAGNOSIS — K5730 Diverticulosis of large intestine without perforation or abscess without bleeding: Secondary | ICD-10-CM | POA: Diagnosis not present

## 2021-02-22 DIAGNOSIS — K862 Cyst of pancreas: Secondary | ICD-10-CM | POA: Diagnosis not present

## 2021-02-22 DIAGNOSIS — K7460 Unspecified cirrhosis of liver: Secondary | ICD-10-CM | POA: Diagnosis not present

## 2021-02-22 IMAGING — MR MR ABDOMEN WO/W CM
11 of 17 series · 25 of 48 positions shown · IV contrast (18 ML MULTIHANCE)
Comparison: Ultrasound [DATE]

CLINICAL DATA: Cirrhosis

EXAM:
MRI ABDOMEN WITHOUT AND WITH CONTRAST
TECHNIQUE: Multiplanar multisequence MR imaging of the abdomen was performed
both before and after the administration of intravenous contrast.
CONTRAST:  18mL MULTIHANCE GADOBENATE DIMEGLUMINE 529 MG/ML IV SOLN

[Series 3: T2 · coronal · 5.0mm · 1.56mm/px · 1 of 30 slices shown (1 of 3)]
[im 1/30]
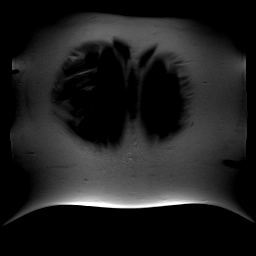

[Series 4: axial tru fisp · axial · 5.0mm · 1.41mm/px · 1 of 40 slices shown]
[im 1/40]
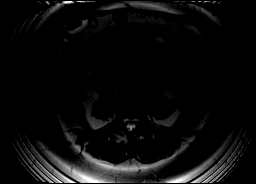

[Series 5: T2 · axial · 6.5mm · 0.74mm/px · 1 of 35 slices shown (2 of 3)]
[im 1/35]
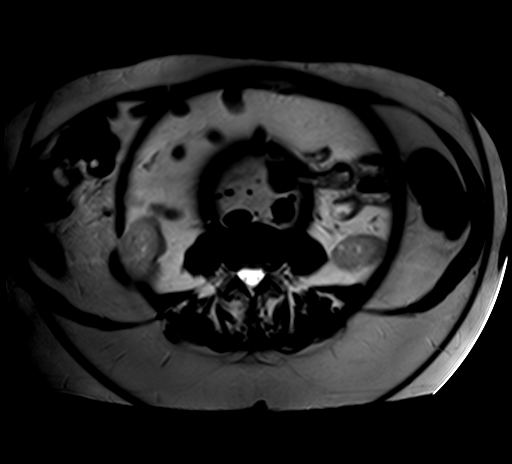

[Series 6: [id number] · axial · 6.0mm · 1.98mm/px · z∈[-132,+125]mm · 3 of 102 slices shown]
[im 1/102]
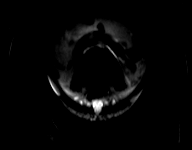
[im 51/102]
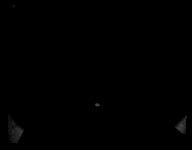
[im 102/102]
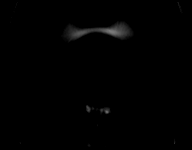

[Series 7: [id number]_adc · axial · 6.0mm · 1.98mm/px · 1 of 34 slices shown]
[im 1/34]
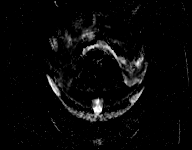

[Series 8: T2 · axial · 5.0mm · 1.37mm/px · 1 of 35 slices shown (3 of 3)]
[im 1/35]
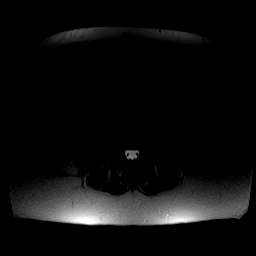

[Series 9: axial in out · axial · 6.0mm · 0.74mm/px · z∈[-137,+91]mm · 2 of 68 slices shown]
[im 1/68]
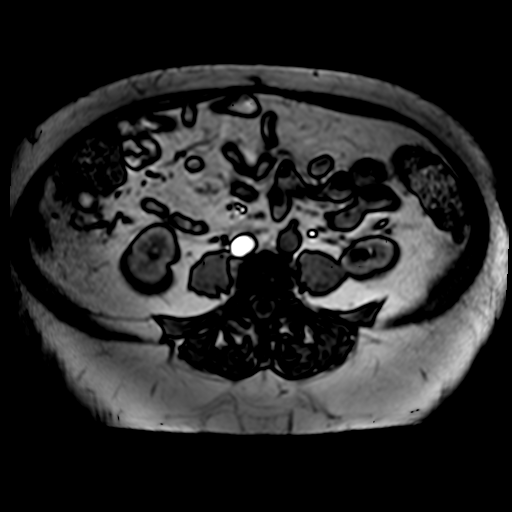
[im 68/68]
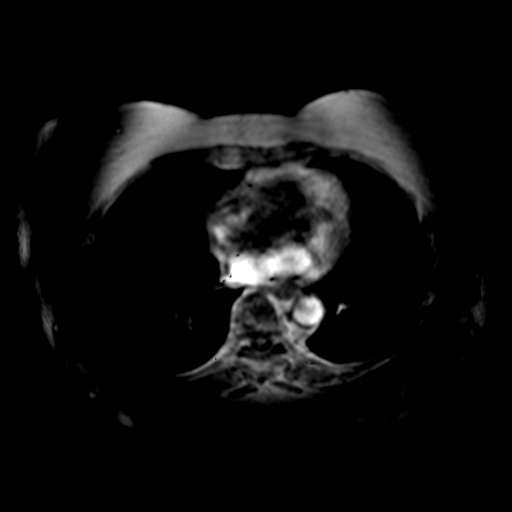

[Series 10: T1 dynamic · axial · non-contrast · 2.2mm · 0.78mm/px · z∈[-128,+81]mm · 3 of 96 slices shown]
[im 1/96]
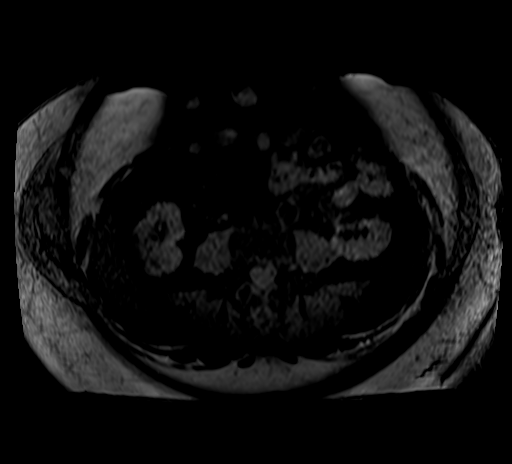
[im 48/96]
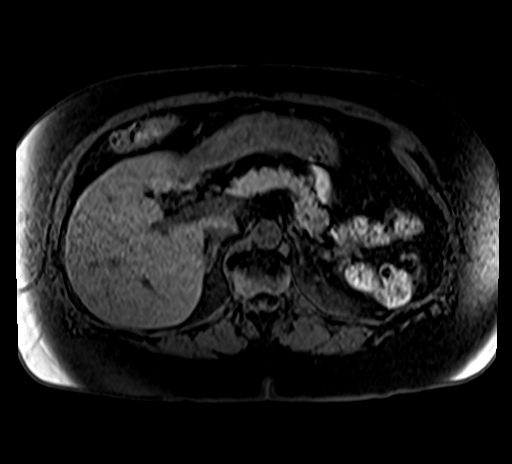
[im 96/96]
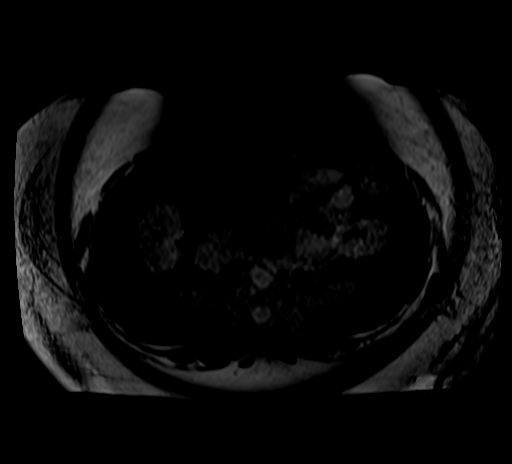

[Series 11: post 25 sec · axial · 2.2mm · 0.78mm/px · z∈[-128,+81]mm · 4 of 96 slices shown]
[im 1/96]
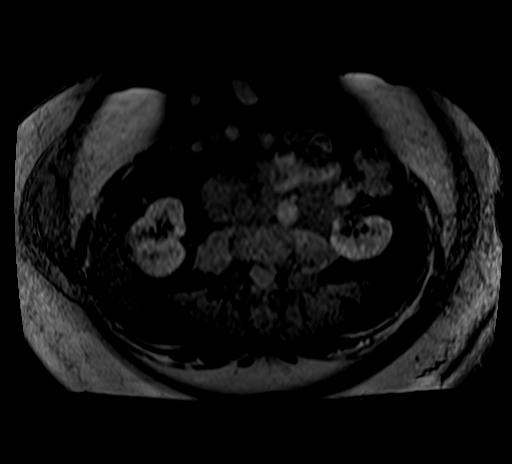
[im 32/96]
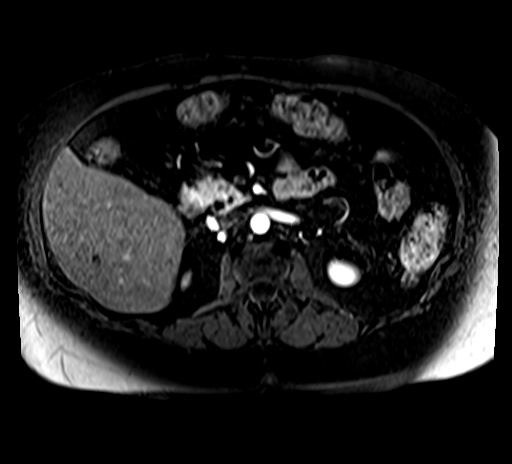
[im 64/96]
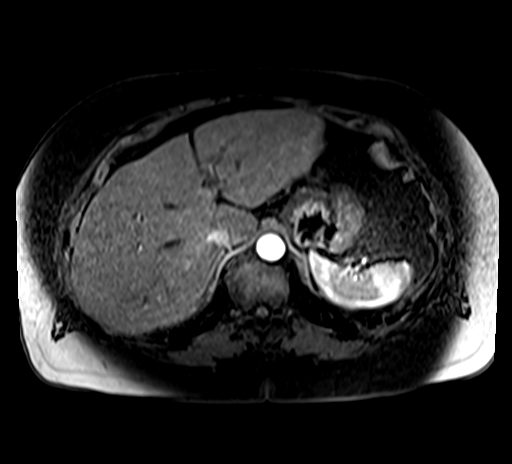
[im 96/96]
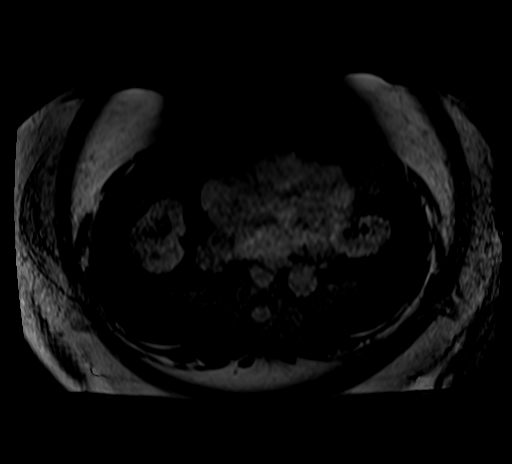

[Series 12: post 25 sec_sub · axial · 2.2mm · 0.78mm/px · z∈[-128,+81]mm · 4 of 96 slices shown]
[im 1/96]
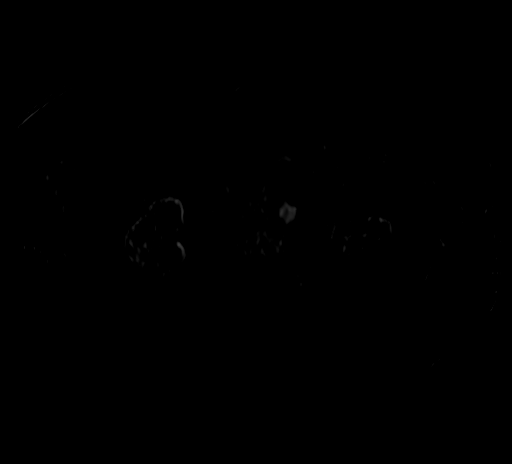
[im 32/96]
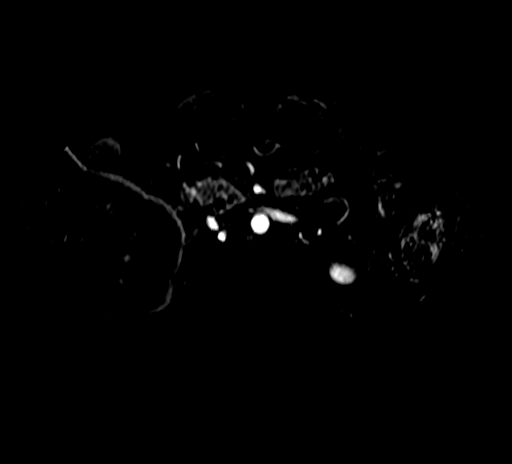
[im 64/96]
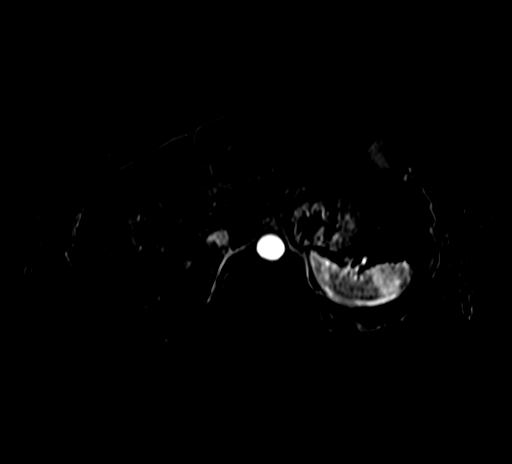
[im 96/96]
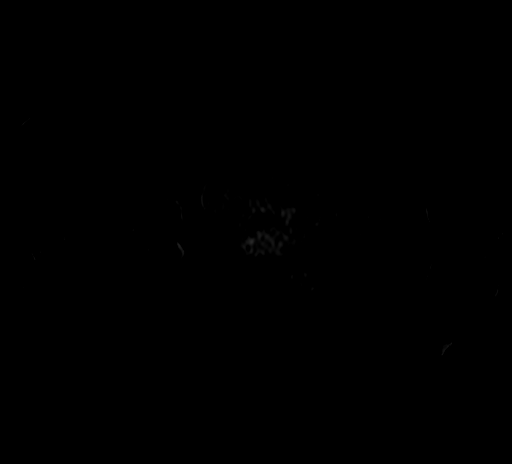

[Series 13: post 45 sec · axial · 2.2mm · 0.78mm/px · z∈[-128,+81]mm · 4 of 96 slices shown]
[im 1/96]
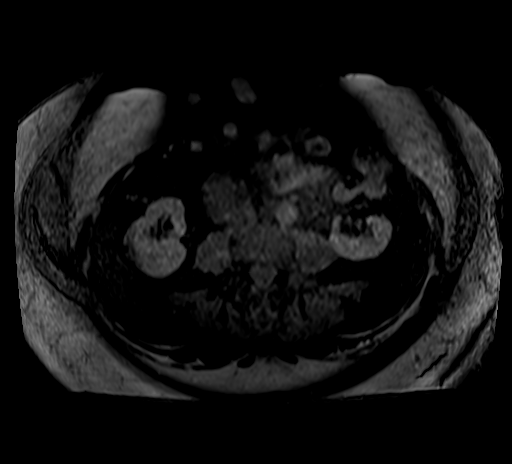
[im 32/96]
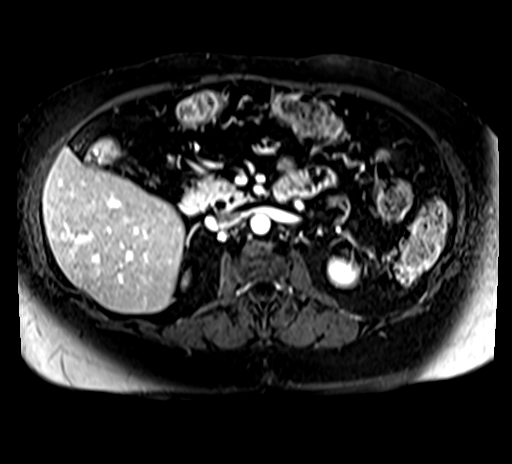
[im 64/96]
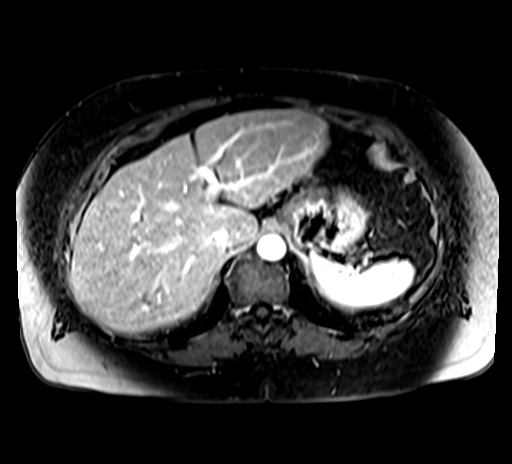
[im 96/96]
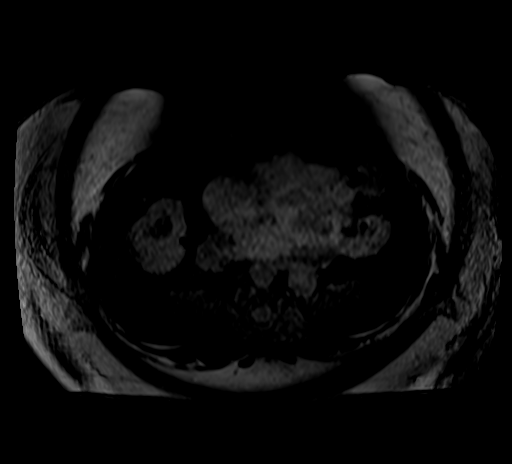

[25 of 48 positions shown; findings below may reference images not displayed]

FINDINGS: Lower chest: No acute abnormality.

Hepatobiliary: Hepatomegaly measuring 20 cm in maximum craniocaudal
dimension. Moderate diffuse hepatic steatosis with some sparing
along the falciform ligament. Widening of the hepatic fissures with
enlargement of the caudate and left lobe of the liver and subtle
contour nodularity, consistent with given diagnosis of cirrhosis. No
arterially enhancing hepatic lesions. Gallbladder surgically absent.
Prominence of the intra and extrahepatic biliary tree with tapering
of the common duct distally, most consistent with reservoir effect
post cholecystectomy.

Pancreas: No pancreatic ductal dilation. Intrinsic T1 signal of the
pancreatic parenchyma is within normal limits. T2 hyperintense 6 mm
lesion in the head of the pancreas on image [DATE] which does not
demonstrate suspicious enhancing nodularity or septations.

Spleen:  Within normal limits in size and appearance.

Adrenals/Urinary Tract: Bilateral adrenal glands are unremarkable.
No hydronephrosis. Bilateral renal cysts. No solid enhancing renal
lesion within the portions of the kidneys on visualized on
postcontrast sequences.

Stomach/Bowel: Gastric postsurgical changes. No pathologic dilation
of small bowel in the abdomen. Colonic diverticulosis without
findings of acute diverticulitis.

Vascular/Lymphatic: Aortic atherosclerosis without aneurysmal
dilation. The portal, splenic and superior mesenteric veins are
patent. Patent hepatic veins. Common hepatic artery arising directly
from the aorta, anatomic variant. The no pathologically enlarged
abdominal or pelvic lymph nodes.

Other:  No abdominal ascites.

Musculoskeletal: No suspicious hepatic lesions.
IMPRESSION: 1. Cirrhotic hepatic morphology. No arterially enhancing hepatic
lesions.
2. Hepatomegaly with moderate diffuse hepatic steatosis and fatty
sparing along the falciform ligament.
3. Cystic 6 mm lesion in the head of the pancreas, favored to
represent a benign pseudocyst or IPMN. Recommend follow up pre and
post contrast MRI/MRCP or pancreatic protocol CT in 2 years. This
recommendation follows ACR consensus guidelines: Management of
Incidental Pancreatic Cysts: A White Paper of the ACR Incidental
Findings Committee. [HOSPITAL] [XO];[DATE].
4. Prominence of the intra and extrahepatic biliary tree with
tapering of the common duct distally, most consistent with reservoir
effect post cholecystectomy.
5.  Aortic Atherosclerosis ([XO]-[XO]).

## 2021-02-22 MED ORDER — GADOBENATE DIMEGLUMINE 529 MG/ML IV SOLN
18.0000 mL | Freq: Once | INTRAVENOUS | Status: AC | PRN
  Administered 2021-02-22: 18 mL via INTRAVENOUS

## 2021-03-16 ENCOUNTER — Telehealth: Payer: Self-pay

## 2021-03-16 NOTE — Telephone Encounter (Signed)
-----   Message from Lucilla Lame, MD sent at 03/15/2021  5:46 PM EDT ----- Please let the patient know that the MRI showed cirrhosis with an enlarged liver and fatty liver.  There was a small lesion on the pancreas that appears to be benign and it was recommended by the radiologist for another MRI in 2 years.

## 2021-03-16 NOTE — Telephone Encounter (Signed)
Pt notified of MRI results through mychart.  

## 2021-03-21 ENCOUNTER — Other Ambulatory Visit: Payer: Self-pay | Admitting: Family

## 2021-03-21 DIAGNOSIS — E538 Deficiency of other specified B group vitamins: Secondary | ICD-10-CM

## 2021-03-24 ENCOUNTER — Other Ambulatory Visit: Payer: Self-pay | Admitting: Family

## 2021-03-24 DIAGNOSIS — E538 Deficiency of other specified B group vitamins: Secondary | ICD-10-CM

## 2021-03-24 NOTE — Telephone Encounter (Signed)
Last B 12 level over one year ago okay to refill?

## 2021-03-26 ENCOUNTER — Other Ambulatory Visit: Payer: Self-pay

## 2021-03-26 ENCOUNTER — Other Ambulatory Visit (INDEPENDENT_AMBULATORY_CARE_PROVIDER_SITE_OTHER): Payer: Medicare PPO

## 2021-03-26 DIAGNOSIS — E538 Deficiency of other specified B group vitamins: Secondary | ICD-10-CM | POA: Diagnosis not present

## 2021-03-26 LAB — B12 AND FOLATE PANEL
Folate: 7.2 ng/mL (ref >5.9)
Vitamin B-12: 365 pg/mL (ref 211–911)

## 2021-03-31 LAB — INTRINSIC FACTOR ANTIBODIES: Intrinsic Factor: NEGATIVE

## 2021-03-31 LAB — HOMOCYSTEINE: Homocysteine: 10.3 umol/L (ref <10.4)

## 2021-03-31 LAB — ANTI-PARIETAL ANTIBODY: PARIETAL CELL AB SCREEN: NEGATIVE

## 2021-03-31 LAB — METHYLMALONIC ACID, SERUM: Methylmalonic Acid, Quant: 125 nmol/L (ref 87–318)

## 2021-06-03 ENCOUNTER — Ambulatory Visit: Payer: Medicare PPO

## 2021-06-04 ENCOUNTER — Ambulatory Visit: Payer: Medicare PPO

## 2021-06-23 DIAGNOSIS — Z961 Presence of intraocular lens: Secondary | ICD-10-CM | POA: Diagnosis not present

## 2021-07-07 ENCOUNTER — Ambulatory Visit: Payer: Medicare PPO | Admitting: Family

## 2021-07-14 ENCOUNTER — Ambulatory Visit: Payer: Medicare PPO | Admitting: Family

## 2021-07-14 ENCOUNTER — Other Ambulatory Visit: Payer: Self-pay

## 2021-07-14 VITALS — BP 120/78 | HR 76 | Temp 96.0°F | Ht 63.5 in | Wt 199.4 lb

## 2021-07-14 DIAGNOSIS — F17200 Nicotine dependence, unspecified, uncomplicated: Secondary | ICD-10-CM

## 2021-07-14 DIAGNOSIS — K8689 Other specified diseases of pancreas: Secondary | ICD-10-CM

## 2021-07-14 DIAGNOSIS — F419 Anxiety disorder, unspecified: Secondary | ICD-10-CM | POA: Diagnosis not present

## 2021-07-14 DIAGNOSIS — Z1231 Encounter for screening mammogram for malignant neoplasm of breast: Secondary | ICD-10-CM

## 2021-07-14 DIAGNOSIS — E785 Hyperlipidemia, unspecified: Secondary | ICD-10-CM | POA: Diagnosis not present

## 2021-07-14 DIAGNOSIS — E1165 Type 2 diabetes mellitus with hyperglycemia: Secondary | ICD-10-CM | POA: Diagnosis not present

## 2021-07-14 DIAGNOSIS — K7460 Unspecified cirrhosis of liver: Secondary | ICD-10-CM

## 2021-07-14 DIAGNOSIS — E538 Deficiency of other specified B group vitamins: Secondary | ICD-10-CM

## 2021-07-14 LAB — COMPREHENSIVE METABOLIC PANEL
ALT: 39 U/L — ABNORMAL HIGH (ref 0–35)
AST: 26 U/L (ref 0–37)
Albumin: 3.8 g/dL (ref 3.5–5.2)
Alkaline Phosphatase: 72 U/L (ref 39–117)
BUN: 17 mg/dL (ref 6–23)
CO2: 28 mEq/L (ref 19–32)
Calcium: 9 mg/dL (ref 8.4–10.5)
Chloride: 105 mEq/L (ref 96–112)
Creatinine, Ser: 1 mg/dL (ref 0.40–1.20)
GFR: 55.69 mL/min — ABNORMAL LOW (ref >60.00)
Glucose, Bld: 183 mg/dL — ABNORMAL HIGH (ref 70–99)
Potassium: 4.1 mEq/L (ref 3.5–5.1)
Sodium: 140 mEq/L (ref 135–145)
Total Bilirubin: 0.4 mg/dL (ref 0.2–1.2)
Total Protein: 6.3 g/dL (ref 6.0–8.3)

## 2021-07-14 LAB — CBC WITH DIFFERENTIAL/PLATELET
Basophils Absolute: 0 10*3/uL (ref 0.0–0.1)
Basophils Relative: 0.6 % (ref 0.0–3.0)
Eosinophils Absolute: 0.1 10*3/uL (ref 0.0–0.7)
Eosinophils Relative: 1.1 % (ref 0.0–5.0)
HCT: 43 % (ref 36.0–46.0)
Hemoglobin: 14.2 g/dL (ref 12.0–15.0)
Lymphocytes Relative: 32.9 % (ref 12.0–46.0)
Lymphs Abs: 2.4 10*3/uL (ref 0.7–4.0)
MCHC: 33 g/dL (ref 30.0–36.0)
MCV: 96 fl (ref 78.0–100.0)
Monocytes Absolute: 0.4 10*3/uL (ref 0.1–1.0)
Monocytes Relative: 5.9 % (ref 3.0–12.0)
Neutro Abs: 4.4 10*3/uL (ref 1.4–7.7)
Neutrophils Relative %: 59.5 % (ref 43.0–77.0)
Platelets: 181 10*3/uL (ref 150.0–400.0)
RBC: 4.48 Mil/uL (ref 3.87–5.11)
RDW: 14.1 % (ref 11.5–15.5)
WBC: 7.4 10*3/uL (ref 4.0–10.5)

## 2021-07-14 LAB — MICROALBUMIN / CREATININE URINE RATIO
Creatinine,U: 124.1 mg/dL
Microalb Creat Ratio: 1.9 mg/g (ref 0.0–30.0)
Microalb, Ur: 2.4 mg/dL — ABNORMAL HIGH (ref 0.0–1.9)

## 2021-07-14 LAB — TSH: TSH: 1.89 u[IU]/mL (ref 0.35–5.50)

## 2021-07-14 MED ORDER — ALPRAZOLAM 0.25 MG PO TABS: 0.2500 mg | ORAL_TABLET | Freq: Two times a day (BID) | ORAL | 1 refills | Status: DC | PRN

## 2021-07-14 MED ORDER — PRAVASTATIN SODIUM 20 MG PO TABS: 20.0000 mg | ORAL_TABLET | ORAL | 1 refills | Status: DC

## 2021-07-14 MED ORDER — METFORMIN HCL ER 500 MG PO TB24: ORAL_TABLET | ORAL | 3 refills | Status: DC

## 2021-07-14 NOTE — Assessment & Plan Note (Signed)
Discussed at length cystic 6 mm lesion in the head of the pancreas and reviewed MRI abdomen together. Advised that this requires surveillance in 2 years time, 02/2023.  Emphasized the importance of follow-up with Dr Allen Norris.  She verbalized understanding will call to make a follow-up

## 2021-07-14 NOTE — Patient Instructions (Addendum)
As discussed, is very important you continue to follow with Dr. Allen Norris, gastroenterology for liver disease including cirrhosis, fatty liver disease and also new pancreatic lesion seen in MRI in July of this year.  Pancreatic lesion will require surveillance.  Please call Dr. Lynnell Jude office to ensure that you are seeing him at least annually for monitoring of liver disease, pancreatic lesion  [phone]   Restart pravastatin, metformin as discussed. Mammogram as scheduled Referral for CT lung cancer screening program within [place] [facility] Let us know if you dont hear back within a week in regards to an appointment being scheduled.

## 2021-07-14 NOTE — Assessment & Plan Note (Signed)
Overdue for follow-up for surveillance with gastroenterology.  Emphasized importance of at the very least annual follow-up with Dr. Allen Norris. She verbalized understanding will call to make a follow-up

## 2021-07-14 NOTE — Assessment & Plan Note (Signed)
Lab Results  Component Value Date   HGBA1C 7.1 (H) 03/11/2020   Uncontrolled.  Patient is willing to retrial metformin 500mg  and I have counseled her on the importance of remaining compliant with metformin in particular in  the setting of cirrhosis and concerns for progression with poor control of DM.

## 2021-07-14 NOTE — Assessment & Plan Note (Signed)
Chronic, stable.  Rare use of Xanax 0.25 mg.  I have refilled today. I looked up patient on [place] Controlled Substances Reporting System PMP AWARE and saw no activity that raised concern of inappropriate use.

## 2021-07-14 NOTE — Assessment & Plan Note (Signed)
Fatigue improved while on B12.  Patient prefers to continue indefinitely.  She will continue to administer B12 IM at home as she prefers.

## 2021-07-14 NOTE — Assessment & Plan Note (Addendum)
Anticipate uncontrolled.  Resume pravastatin 20 mg.  Patient will start once weekly and we will gradually increase.  Repeat LFTs . Lipid panel at follow-up in 8 weeks

## 2021-07-14 NOTE — Progress Notes (Signed)
Subjective:    Patient ID: Kathy Bryant, female    DOB: March 31, 1946, 75 y.o.   MRN: [id number]  CC: Kathy Bryant is a 75 y.o. female who presents today for follow up.     HPI: Feels well today.  No complaints.  She endorses having difficulty remaining compliant medication because she is a caregiver for her husband.  DM-she is been on metformin in the past however would forget to take.  HLD- she has stopped pravastatin 20mg  due to night mare that she had. She had nightmare on crestor 10mg . She has not tried pravastatin again.   GAD- she takes xanax 0.25mg  rarely with relief. Once per month.  B12 deficiency- feels energy is better on b12 and she would like to continue indefinitely.  She administers her own B12 IM in her thigh without difficulty; she prefers to continue to self administer at.  Smoker Cirrhosis- overdue for follow up with Dr Allen Norris.  She was last seen November 2021. Mri liver 02/2021 which showed cirrhosis, fatty liver disease and pancreatic lesion chronic surveillance  HISTORY:  Past Medical History:  Diagnosis Date   Anxiety    Bilateral calf pain    Diabetes mellitus without complication ([place])    PT STATES SHE WAS ON METFORMIN AND STOPPED TAKING THIS ON HER OWN   Fatty liver    Hepatitis 1960'S   B   Herniated disc, cervical    HTN (hypertension)    PT WAS ON BP MEDS AND THEN STATES HER BP WAS UNDER CONTROL AND SHE STOPPED TAKING BP ON HER OWN   Microscopic hematuria    Obesity    Swelling of both lower extremities    Past Surgical History:  Procedure Laterality Date   APPENDECTOMY  1968   BREAST BIOPSY Right 2013   x2 BENIGN BREAST TISSUE WITH FOCAL HYALINIZED STROMA AND BENIGN BREAST TISSUE WITH FOCAL FIBROADENOMATOUS CHANGE    CESAREAN SECTION  1972 & 1978   CHOLECYSTECTOMY     COLONOSCOPY  03/05/2014   COLONOSCOPY WITH PROPOFOL N/A 04/27/2019   Procedure: COLONOSCOPY WITH PROPOFOL;  Surgeon: Lucilla Lame, MD;  Location: [place];   Service: Endoscopy;  Laterality: N/A;   ESOPHAGOGASTRODUODENOSCOPY (EGD) WITH PROPOFOL N/A 04/27/2019   Procedure: ESOPHAGOGASTRODUODENOSCOPY (EGD) WITH PROPOFOL;  Surgeon: Lucilla Lame, MD;  Location: [place];  Service: Endoscopy;  Laterality: N/A;   GALLBLADDER SURGERY  1982   GASTRIC RESTRICTION SURGERY  1980   'stomach stapled'   HEMORRHOID SURGERY N/A 12/23/2016   Procedure: HEMORRHOIDECTOMY;  Surgeon: Christene Lye, MD;  Location: [facility];  Service: General;  Laterality: N/A;   HERNIA REPAIR     Abdominal   POLYPECTOMY N/A 04/27/2019   Procedure: POLYPECTOMY;  Surgeon: Lucilla Lame, MD;  Location: [place];  Service: Endoscopy;  Laterality: N/A;   TOTAL ABDOMINAL HYSTERECTOMY W/ BILATERAL SALPINGOOPHORECTOMY  1998   Family History  Problem Relation Age of Onset   Diabetes Mother    Cancer Mother 6       Brain Tumor - died age 53   Heart disease Father        CAD - died MI - age 10   Hypertension Father    High Cholesterol Father    Diabetes Sister    Diabetes Brother    Diabetes Brother    Cancer Paternal Aunt        Renal Cell Ca   Cancer Paternal Uncle        Renal  Cell Ca   Breast cancer Neg Hx     Allergies: Simvastatin No current outpatient medications on file prior to visit.   No current facility-administered medications on file prior to visit.    Social History   Tobacco Use   Smoking status: Every Day    Types: Cigarettes    Start date: 05/24/2019   Smokeless tobacco: Never   Tobacco comments:    "I WAS A CLOSET SMOKER AND NEVER SMOKED ALOT"  Vaping Use   Vaping Use: Never used  Substance Use Topics   Alcohol use: Yes    Comment: Occasional glass of wine   Drug use: No    Review of Systems  Constitutional:  Negative for chills and fever.  Respiratory:  Negative for cough.   Cardiovascular:  Negative for chest pain and palpitations.  Gastrointestinal:  Negative for nausea and vomiting.  Psychiatric/Behavioral:   Negative for sleep disturbance.      Objective:    BP 120/78   Pulse 76   Temp (!) 96 F (35.6 C) (Temporal)   Ht 5' 3.5" (1.613 m)   Wt 199 lb 6 oz (90.4 kg)   SpO2 96%   BMI 34.76 kg/m  BP Readings from Last 3 Encounters:  07/14/21 120/78  06/16/20 107/67  04/29/20 130/70   Wt Readings from Last 3 Encounters:  07/14/21 199 lb 6 oz (90.4 kg)  09/25/20 195 lb (88.5 kg)  06/16/20 195 lb 9.6 oz (88.7 kg)    Physical Exam Vitals reviewed.  Constitutional:      Appearance: She is well-developed.  Eyes:     Conjunctiva/sclera: Conjunctivae normal.  Cardiovascular:     Rate and Rhythm: Normal rate and regular rhythm.     Pulses: Normal pulses.     Heart sounds: Normal heart sounds.  Pulmonary:     Effort: Pulmonary effort is normal.     Breath sounds: Normal breath sounds. No wheezing, rhonchi or rales.  Skin:    General: Skin is warm and dry.  Neurological:     Mental Status: She is alert.  Psychiatric:        Speech: Speech normal.        Behavior: Behavior normal.        Thought Content: Thought content normal.       Assessment & Plan:   Problem List Items Addressed This Visit       Digestive   Cirrhosis of liver ([place]) - Primary    Overdue for follow-up for surveillance with gastroenterology.  Emphasized importance of at the very least annual follow-up with Dr. Allen Norris. She verbalized understanding will call to make a follow-up      Relevant Medications   pravastatin (PRAVACHOL) 20 MG tablet     Endocrine   DM (diabetes mellitus) ([place])    Lab Results  Component Value Date   HGBA1C 7.1 (H) 03/11/2020  Uncontrolled.  Patient is willing to retrial metformin 500mg  and I have counseled her on the importance of remaining compliant with metformin in particular in  the setting of cirrhosis and concerns for progression with poor control of DM.       Relevant Medications   metFORMIN (GLUCOPHAGE XR) 500 MG 24 hr tablet   pravastatin (PRAVACHOL) 20 MG tablet    Other Relevant Orders   CBC with Differential/Platelet   Comprehensive metabolic panel   Microalbumin / creatinine urine ratio     Other   Anxiety    Chronic, stable.  Rare use of  Xanax 0.25 mg.  I have refilled today. I looked up patient on [place] Controlled Substances Reporting System PMP AWARE and saw no activity that raised concern of inappropriate use.        Relevant Medications   ALPRAZolam (XANAX) 0.25 MG tablet   Other Relevant Orders   TSH   B12 deficiency    Fatigue improved while on B12.  Patient prefers to continue indefinitely.  She will continue to administer B12 IM at home as she prefers.       HLD (hyperlipidemia)    Anticipate uncontrolled.  Resume pravastatin 20 mg.  Patient will start once weekly and we will gradually increase.  Repeat LFTs . 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 Advised that this requires surveillance in 2 years time, 02/2023.  Emphasized the importance of follow-up with Dr Allen Norris.  She verbalized understanding will call to make a follow-up      Screening for breast cancer   Relevant Orders   MM 3D SCREEN BREAST BILATERAL   Other Visit Diagnoses     Smoker       Relevant Orders   Ambulatory Referral for Lung Cancer Scre        I have discontinued Skye R. Radford's valACYclovir and cyanocobalamin. I am also having her start on metFORMIN. Additionally, I am having her maintain her ALPRAZolam and pravastatin.   Meds ordered this encounter  Medications   ALPRAZolam (XANAX) 0.25 MG tablet    Sig: Take 1 tablet (0.25 mg total) by mouth 2 (two) times daily as needed. for anxiety    Dispense:  30 tablet    Refill:  1   metFORMIN (GLUCOPHAGE XR) 500 MG 24 hr tablet    Sig: Start 500mg  PO qpm.    Dispense:  90 tablet    Refill:  3    Order Specific  Question:   Supervising Provider    Answer:   Deborra Medina L [[id number]]   pravastatin (PRAVACHOL) 20 MG tablet    Sig: Take 1 tablet (20 mg total) by mouth once a week.    Dispense:  12 tablet    Refill:  1    Order Specific Question:   Supervising Provider    Answer:   Crecencio Mc [[id number]]     Return precautions given.   Risks, benefits, and alternatives of the medications and treatment plan prescribed today were discussed, and patient expressed understanding.   Education regarding symptom management and diagnosis given to patient on AVS.  Continue to follow with Burnard Hawthorne, FNP for routine health maintenance.   Kathy Bryant and I agreed with plan.   [name], FNP

## 2021-07-20 ENCOUNTER — Other Ambulatory Visit: Payer: Self-pay

## 2021-07-20 MED ORDER — LOSARTAN POTASSIUM 25 MG PO TABS: 25.0000 mg | ORAL_TABLET | Freq: Every day | ORAL | 1 refills | Status: DC

## 2021-07-22 ENCOUNTER — Other Ambulatory Visit: Payer: Self-pay

## 2021-07-22 ENCOUNTER — Ambulatory Visit
Admission: RE | Admit: 2021-07-22 | Discharge: 2021-07-22 | Disposition: A | Discharge status: Home / Self Care | Payer: Medicare PPO | Source: Ambulatory Visit | Attending: Family | Admitting: Family

## 2021-07-22 DIAGNOSIS — Z1231 Encounter for screening mammogram for malignant neoplasm of breast: Secondary | ICD-10-CM | POA: Diagnosis not present

## 2021-07-22 IMAGING — MG MM DIGITAL SCREENING BILAT W/ TOMO AND CAD
8 series · 8 of 24 positions shown · non-contrast
Comparison: Previous exam(s).

CLINICAL DATA: Screening.

EXAM:
DIGITAL SCREENING BILATERAL MAMMOGRAM WITH TOMOSYNTHESIS AND CAD
TECHNIQUE: Bilateral screening digital craniocaudal and mediolateral oblique
mammograms were obtained. Bilateral screening digital breast
tomosynthesis was performed. The images were evaluated with
computer-aided detection.

[R CC synth-2D]
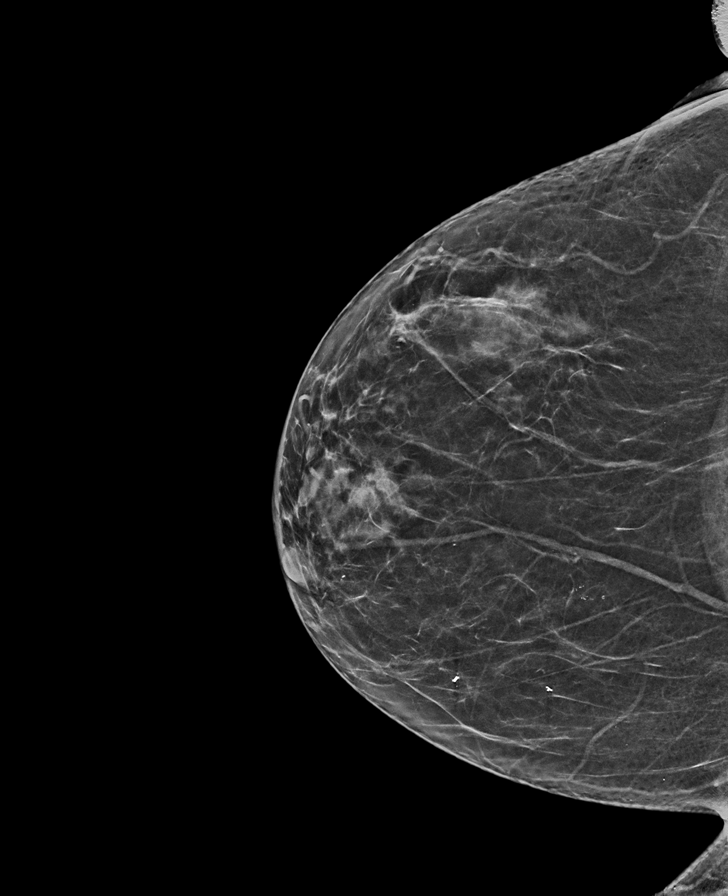

[L MLO synth-2D]
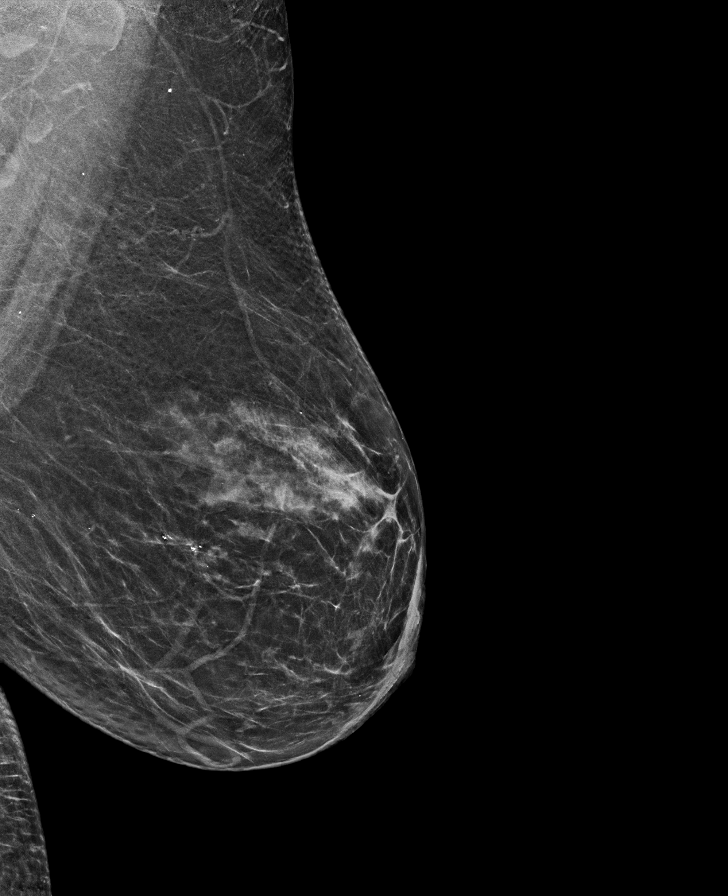

[L CC synth-2D]
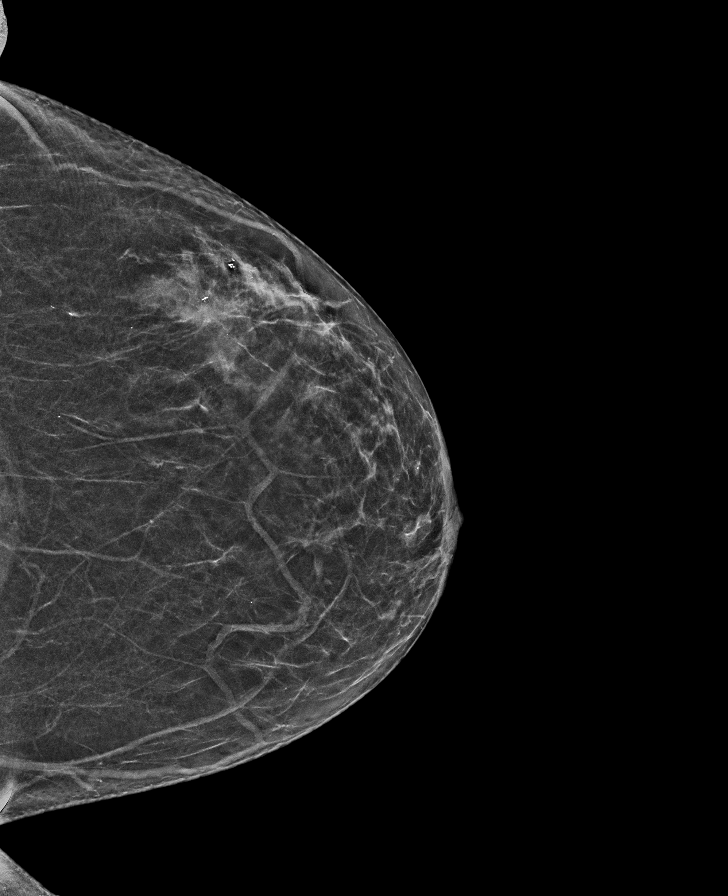

[R MLO synth-2D]
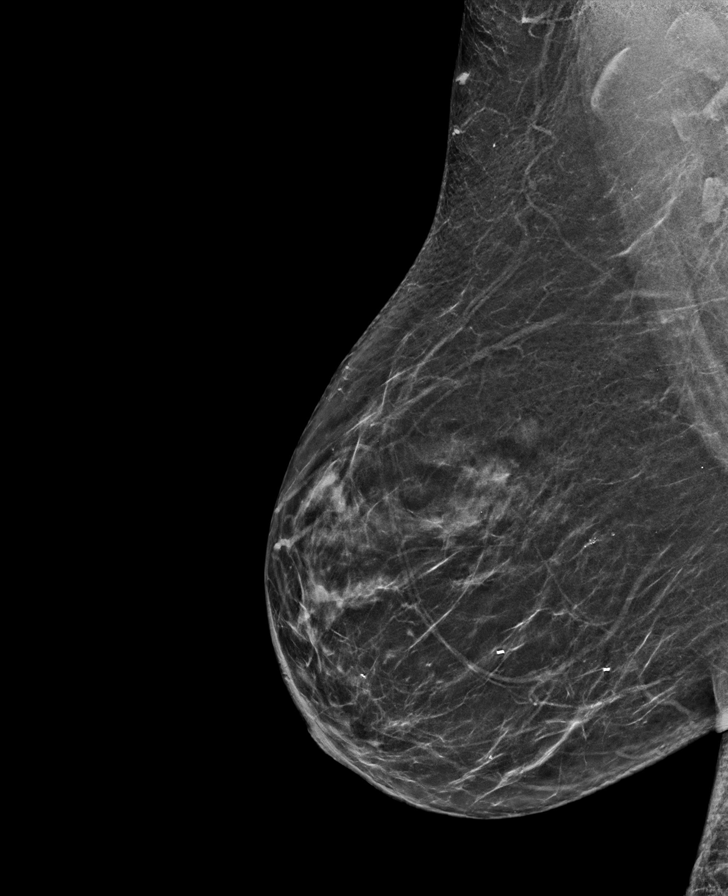

[R MLO tomo · tomo slice 31/61.0]
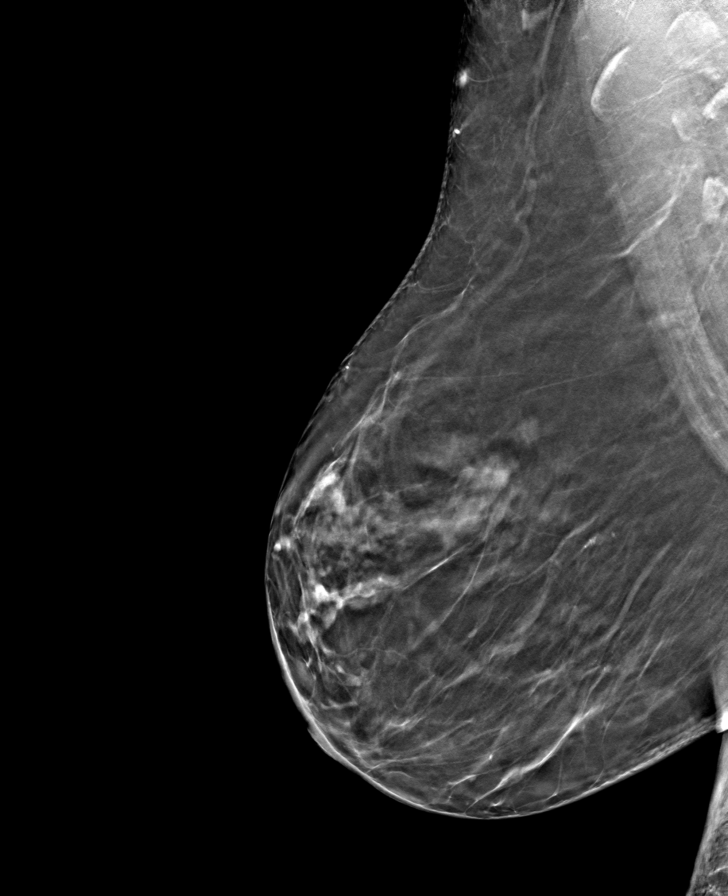

[L MLO tomo · tomo slice 31/62.0]
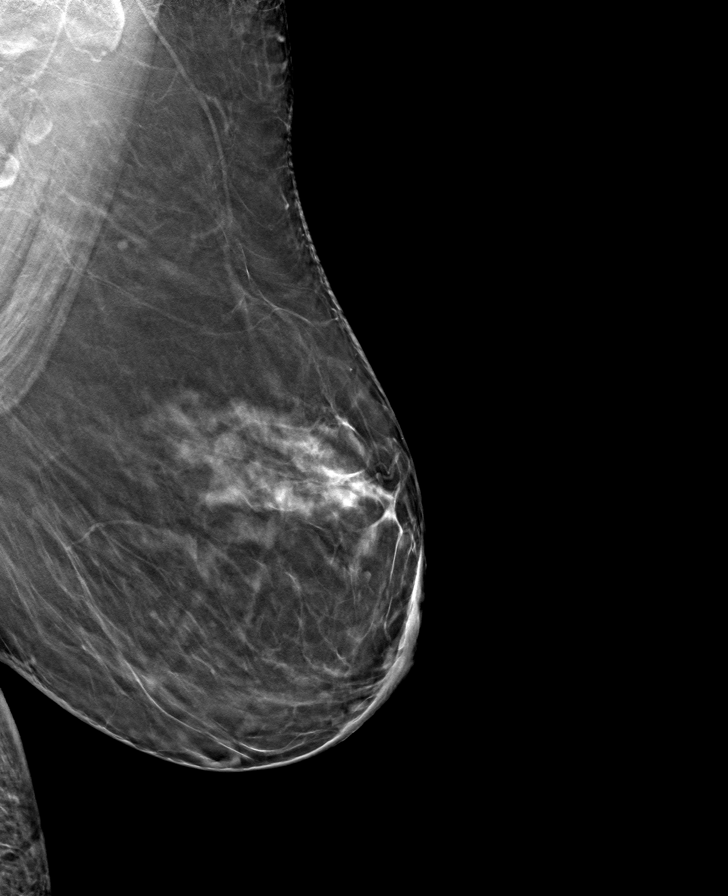

[R CC tomo · tomo slice 30/59.0]
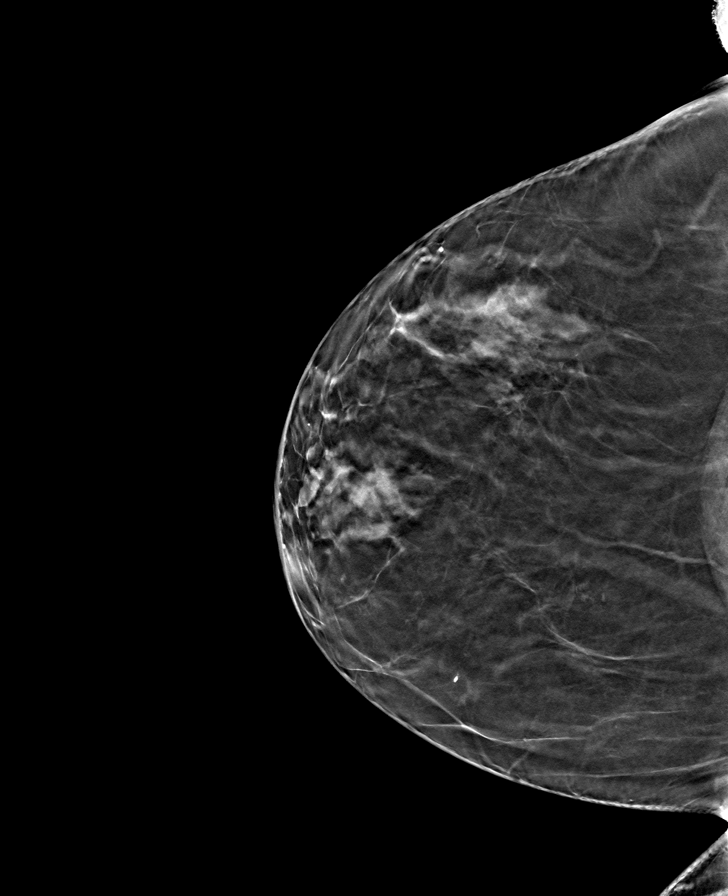

[L CC tomo · tomo slice 26/51.0]
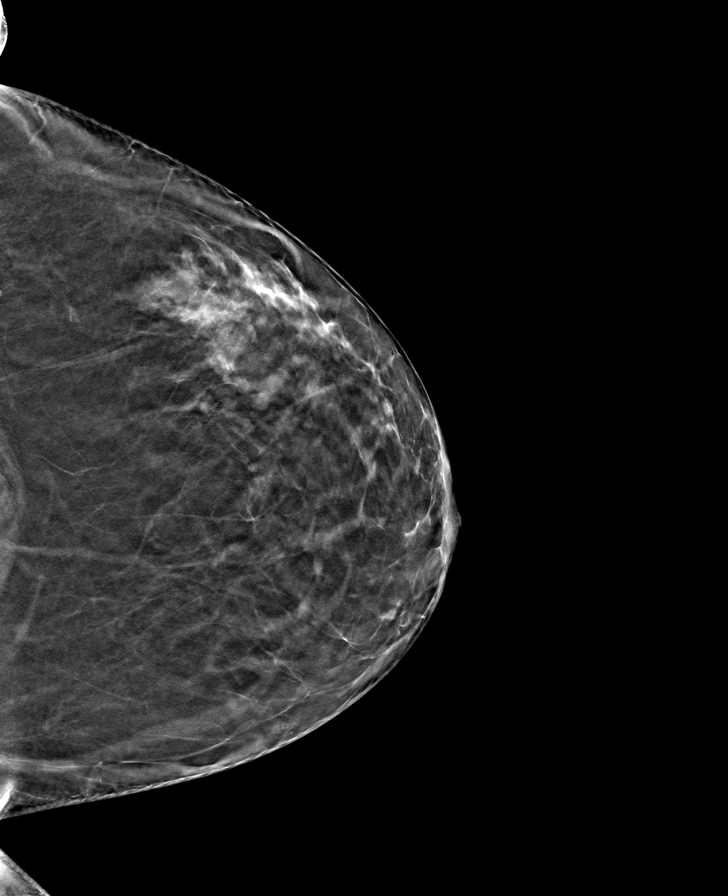

[8 of 24 positions shown; findings below may reference images not displayed]

ACR Breast Density Category b: There are scattered areas of
fibroglandular density.
FINDINGS: There are no findings suspicious for malignancy.
IMPRESSION: No mammographic evidence of malignancy. A result letter of this
screening mammogram will be mailed directly to the patient.

RECOMMENDATION:
Screening mammogram in one year. (Code:[BY])

BI-RADS CATEGORY  1: Negative.

## 2021-07-23 ENCOUNTER — Ambulatory Visit: Payer: Medicare PPO

## 2021-08-27 ENCOUNTER — Ambulatory Visit (INDEPENDENT_AMBULATORY_CARE_PROVIDER_SITE_OTHER): Payer: Medicare PPO

## 2021-08-27 VITALS — Ht 63.5 in | Wt 199.0 lb

## 2021-08-27 DIAGNOSIS — Z0000 Encounter for general adult medical examination without abnormal findings: Secondary | ICD-10-CM | POA: Diagnosis not present

## 2021-08-27 NOTE — Patient Instructions (Addendum)
°  Kathy Bryant , Thank you for taking time to come for your Medicare Wellness Visit. I appreciate your ongoing commitment to your health goals. Please review the following plan we discussed and let me know if I can assist you in the future.   These are the goals we discussed:  Goals       Patient Stated     DIET - INCREASE WATER INTAKE (pt-stated)      Low carb foods      Healthy lifestyle (pt-stated)      Stay active Healthy diet        This is a list of the screening recommended for you and due dates:  Health Maintenance  Topic Date Due   COVID-19 Vaccine (3 - Booster for Pfizer series) 09/12/2021*   Zoster (Shingles) Vaccine (1 of 2) 11/25/2021*   Eye exam for diabetics  09/10/2021   Hemoglobin A1C  01/12/2022   Complete foot exam   07/14/2022   Mammogram  07/23/2023   Tetanus Vaccine  12/01/2023   Colon Cancer Screening  04/26/2024   Pneumonia Vaccine  Completed   DEXA scan (bone density measurement)  Completed   Hepatitis C Screening: USPSTF Recommendation to screen - Ages 70-79 yo.  Completed   HPV Vaccine  Aged Out   Flu Shot  Discontinued  *Topic was postponed. The date shown is not the original due date.

## 2021-08-27 NOTE — Progress Notes (Signed)
Subjective:   Kathy Bryant is a 76 y.o. female who presents for Medicare Annual (Subsequent) preventive examination.  Review of Systems    No ROS.  Medicare Wellness Virtual Visit.  Visual/audio telehealth visit, UTA vital signs.   See social history for additional risk factors.   Cardiac Risk Factors include: advanced age (>25men, >58 women);diabetes mellitus     Objective:    Today's Vitals   08/27/21 0833  Weight: 199 lb (90.3 kg)  Height: 5' 3.5" (1.613 m)   Body mass index is 34.7 kg/m.  Advanced Directives 08/27/2021 06/02/2020 04/27/2019 03/07/2019 12/23/2016 12/22/2016  Does Patient Have a Medical Advance Directive? No No No No No No  Would patient like information on creating a medical advance directive? No - Patient declined No - Patient declined No - Patient declined Yes (MAU/Ambulatory/Procedural Areas - Information given) No - Patient declined -    Current Medications (verified) Outpatient Encounter Medications as of 08/27/2021  Medication Sig   ALPRAZolam (XANAX) 0.25 MG tablet Take 1 tablet (0.25 mg total) by mouth 2 (two) times daily as needed. for anxiety   losartan (COZAAR) 25 MG tablet Take 1 tablet (25 mg total) by mouth daily.   metFORMIN (GLUCOPHAGE XR) 500 MG 24 hr tablet Start 500mg  PO qpm.   pravastatin (PRAVACHOL) 20 MG tablet Take 1 tablet (20 mg total) by mouth once a week.   No facility-administered encounter medications on file as of 08/27/2021.    Allergies (verified) Simvastatin   History: Past Medical History:  Diagnosis Date   Anxiety    Bilateral calf pain    Diabetes mellitus without complication ([facility])    PT STATES SHE WAS ON METFORMIN AND STOPPED TAKING THIS ON HER OWN   Fatty liver    Hepatitis 1960'S   B   Herniated disc, cervical    HTN (hypertension)    PT WAS ON BP MEDS AND THEN STATES HER BP WAS UNDER CONTROL AND SHE STOPPED TAKING BP ON HER OWN   Microscopic hematuria    Obesity    Swelling of both lower extremities     Past Surgical History:  Procedure Laterality Date   APPENDECTOMY  1968   BREAST BIOPSY Right 2013   x2 BENIGN BREAST TISSUE WITH FOCAL HYALINIZED STROMA AND BENIGN BREAST TISSUE WITH FOCAL FIBROADENOMATOUS CHANGE    CESAREAN SECTION  1972 & 1978   CHOLECYSTECTOMY     COLONOSCOPY  03/05/2014   COLONOSCOPY WITH PROPOFOL N/A 04/27/2019   Procedure: COLONOSCOPY WITH PROPOFOL;  Surgeon: Lucilla Lame, MD;  Location: [place];  Service: Endoscopy;  Laterality: N/A;   ESOPHAGOGASTRODUODENOSCOPY (EGD) WITH PROPOFOL N/A 04/27/2019   Procedure: ESOPHAGOGASTRODUODENOSCOPY (EGD) WITH PROPOFOL;  Surgeon: Lucilla Lame, MD;  Location: [place];  Service: Endoscopy;  Laterality: N/A;   GALLBLADDER SURGERY  1982   GASTRIC RESTRICTION SURGERY  1980   'stomach stapled'   HEMORRHOID SURGERY N/A 12/23/2016   Procedure: HEMORRHOIDECTOMY;  Surgeon: Christene Lye, MD;  Location: [facility];  Service: General;  Laterality: N/A;   HERNIA REPAIR     Abdominal   POLYPECTOMY N/A 04/27/2019   Procedure: POLYPECTOMY;  Surgeon: Lucilla Lame, MD;  Location: [place];  Service: Endoscopy;  Laterality: N/A;   TOTAL ABDOMINAL HYSTERECTOMY W/ BILATERAL SALPINGOOPHORECTOMY  1998   Family History  Problem Relation Age of Onset   Diabetes Mother    Cancer Mother 45       Brain Tumor - died age 59  Heart disease Father        CAD - died MI - age 41   Hypertension Father    High Cholesterol Father    Diabetes Sister    Diabetes Brother    Diabetes Brother    Cancer Paternal Aunt        Renal Cell Ca   Cancer Paternal Uncle        Renal Cell Ca   Breast cancer Neg Hx    Social History   Socioeconomic History   Marital status: Married    Spouse name: Not on file   Number of children: 2   Years of education: 13   Highest education level: Not on file  Occupational History   Occupation: Sports administrator: [employer]   Tobacco Use   Smoking status: Every  Day    Types: Cigarettes    Start date: 05/24/2019   Smokeless tobacco: Never   Tobacco comments:    "I WAS A CLOSET SMOKER AND NEVER SMOKED ALOT"  Vaping Use   Vaping Use: Never used  Substance and Sexual Activity   Alcohol use: Yes    Comment: Occasional glass of wine   Drug use: No   Sexual activity: Not on file  Other Topics Concern   Not on file  Social History Narrative   Not on file   Social Determinants of Health   Financial Resource Strain: Low Risk    Difficulty of Paying Living Expenses: Not hard at all  Food Insecurity: No Food Insecurity   Worried About Charity fundraiser in the Last Year: Never true   Ran Out of Food in the Last Year: Never true  Transportation Needs: No Transportation Needs   Lack of Transportation (Medical): No   Lack of Transportation (Non-Medical): No  Physical Activity: Not on file  Stress: No Stress Concern Present   Feeling of Stress : Only a little  Social Connections: Unknown   Frequency of Communication with Friends and Family: More than three times a week   Frequency of Social Gatherings with Friends and Family: More than three times a week   Attends Religious Services: Not on Electrical engineer or Organizations: Not on file   Attends Archivist Meetings: Not on file   Marital Status: Not on file    Tobacco Counseling Ready to quit: Not Answered Counseling given: Not Answered Tobacco comments: "I WAS A CLOSET SMOKER AND NEVER SMOKED ALOT"   Clinical Intake:  Pre-visit preparation completed: Yes        Diabetes: Yes (Followed by PCP)  How often do you need to have someone help you when you read instructions, pamphlets, or other written materials from your doctor or pharmacy?: 1 - Never  Nutrition Risk Assessment: Does the patient have any non-healing wounds?  No   Financial Strains and Diabetes Management: Are you having any financial strains with the device, your supplies or your medication?  No .  Does the patient want to be seen by Chronic Care Management for management of their diabetes?  No  Would the patient like to be referred to a Nutritionist or for Diabetic Management?  No   Interpreter Needed?: No      Activities of Daily Living In your present state of health, do you have any difficulty performing the following activities: 08/27/2021  Hearing? N  Vision? N  Difficulty concentrating or making decisions? N  Walking or climbing stairs? N  Dressing or bathing? N  Doing errands, shopping? N  Preparing Food and eating ? N  Using the Toilet? N  In the past six months, have you accidently leaked urine? N  Do you have problems with loss of bowel control? N  Managing your Medications? N  Managing your Finances? N  Housekeeping or managing your Housekeeping? N  Some recent data might be hidden    Patient Care Team: Burnard Hawthorne, FNP as PCP - General (Family Medicine)  Indicate any recent Medical Services you may have received from other than [facility] providers in the past year (date may be approximate).     Assessment:   This is a routine wellness examination for Kathy Bryant.  Virtual Visit via Telephone Note  I connected with  Kathy Bryant on 08/27/21 at  8:15 AM EST by telephone and verified that I am speaking with the correct person using two identifiers.  Persons participating in the virtual visit: patient/Nurse Health Advisor   I discussed the limitations, risks, security and privacy concerns of performing an evaluation and management service by telephone and the availability of in person appointments. The patient expressed understanding and agreed to proceed.  Interactive audio and video telecommunications were attempted between this nurse and patient, however failed, due to patient having technical difficulties OR patient did not have access to video capability.  We continued and completed visit with audio only.  Some vital signs may be absent or  patient reported.   Hearing/Vision screen Hearing Screening - Comments:: Patient is able to hear conversational tones without difficulty.  No issues reported. Vision Screening - Comments:: Cataracts extracted, bilateral They have seen their ophthalmologist in the last 12-24 months.   Dietary issues and exercise activities discussed:  Healthy diet Good water intake   Goals Addressed               This Visit's Progress     Patient Stated     Healthy lifestyle (pt-stated)        Stay active Healthy diet       Depression Screen PHQ 2/9 Scores 08/27/2021 09/25/2020 06/02/2020 03/07/2019 06/12/2018 05/12/2018 05/02/2017  PHQ - 2 Score 0 0 0 1 3 0 0  PHQ- 9 Score - - - - 9 - -  Exception Documentation - - - - Medical reason - -    Fall Risk Fall Risk  08/27/2021 09/25/2020 06/02/2020 03/11/2020 11/23/2019  Falls in the past year? 0 0 0 0 0  Number falls in past yr: 0 0 0 - -  Follow up Falls evaluation completed Falls evaluation completed Falls evaluation completed Falls evaluation completed Falls evaluation completed    FALL RISK PREVENTION PERTAINING TO THE HOME: Home free of loose throw rugs in walkways, pet beds, electrical cords, etc? Yes  Adequate lighting in your home to reduce risk of falls? Yes   ASSISTIVE DEVICES UTILIZED TO PREVENT FALLS: Life alert? No  Use of a cane, walker or w/c? No   TIMED UP AND GO: Was the test performed? No .   Cognitive Function:  Patient is alert and oriented x3.    6CIT Screen 08/27/2021 03/07/2019  What Year? 0 points 0 points  What month? 0 points 0 points  What time? 0 points 0 points  Count back from 20 - 0 points  Months in reverse - 0 points    Immunizations Immunization History  Administered Date(s) Administered   Hepb-cpg 08/18/2018, 09/20/2018   Influenza-Unspecified 05/05/2011   PFIZER(Purple Top)SARS-COV-2  Vaccination 10/05/2019, 10/31/2019   Pneumococcal Conjugate-13 11/30/2013   Pneumococcal Polysaccharide-23  05/12/2018   Td 11/30/2013   Tdap 05/05/2011   Zoster, Live 06/27/2012   Shingrix Completed?: No.    Education has been provided regarding the importance of this vaccine. Patient has been advised to call insurance company to determine out of pocket expense if they have not yet received this vaccine. Advised may also receive vaccine at local pharmacy or Health Dept. Verbalized acceptance and understanding.  Screening Tests Health Maintenance  Topic Date Due   COVID-19 Vaccine (3 - Booster for Pfizer series) 09/12/2021 (Originally 12/26/2019)   Zoster Vaccines- Shingrix (1 of 2) 11/25/2021 (Originally 07/11/1997)   OPHTHALMOLOGY EXAM  09/10/2021   HEMOGLOBIN A1C  01/12/2022   FOOT EXAM  07/14/2022   MAMMOGRAM  07/23/2023   TETANUS/TDAP  12/01/2023   COLONOSCOPY (Pts 45-31yrs Insurance coverage will need to be confirmed)  04/26/2024   Pneumonia Vaccine 12+ Years old  Completed   DEXA SCAN  Completed   Hepatitis C Screening  Completed   HPV VACCINES  Aged Out   INFLUENZA VACCINE  Discontinued   Health Maintenance There are no preventive care reminders to display for this patient.  Vision Screening: Recommended annual ophthalmology exams for early detection of glaucoma and other disorders of the eye.  Dental Screening: Recommended annual dental exams for proper oral hygiene  Community Resource Referral / Chronic Care Management: CRR required this visit?  No   CCM required this visit?  No      Plan:   Keep all routine maintenance appointments.   I have personally reviewed and noted the following in the patients chart:   Medical and social history Use of alcohol, tobacco or illicit drugs  Current medications and supplements including opioid prescriptions. Not taking opioid.  Functional ability and status Nutritional status Physical activity Advanced directives List of other physicians Hospitalizations, surgeries, and ER visits in previous 12 months Vitals Screenings to  include cognitive, depression, and falls Referrals and appointments  In addition, I have reviewed and discussed with patient certain preventive protocols, quality metrics, and best practice recommendations. A written personalized care plan for preventive services as well as general preventive health recommendations were provided to patient.     Varney Biles, LPN   2/44/0102

## 2021-09-02 ENCOUNTER — Telehealth: Payer: Self-pay | Admitting: Acute Care

## 2021-09-02 DIAGNOSIS — Z87891 Personal history of nicotine dependence: Secondary | ICD-10-CM

## 2021-09-02 NOTE — Addendum Note (Signed)
Addended by: Burnard Hawthorne on: 09/02/2021 03:13 PM   Modules accepted: Orders

## 2021-09-02 NOTE — Telephone Encounter (Signed)
Thank you [name] for your thoroughness here  Kathy Bryant Please call patient advise I do agree a CT chest without contrast based on smoking history is appropriate.  I have ordered Let us know if you dont hear back within a week in regards to an appointment being scheduled.

## 2021-09-02 NOTE — Telephone Encounter (Signed)
Spoke with patient by phone regarding LCS referral.  Patient had quit smoking for 19 years and started again but due to her history of one half pack per day, she did not qualify LCS number of pack years required.  We reviewed the numbers a couple of times to make sure of the data but could not qualify for LCS criteria.  The patient can have a CT chest without contrast as an alternative, which would be ordered by her PCP.  Patient acknowledged understanding. She is interested in a the CT chest if PCP recommends this.  Routed this message to [name] FNP for update.  Referral has been closed.

## 2021-09-08 NOTE — Telephone Encounter (Signed)
Patient stated that she was contacted & she had not in total smoked 30 years. She does not qualify for the scan.

## 2021-09-09 ENCOUNTER — Encounter: Payer: Self-pay | Admitting: Family

## 2021-09-09 ENCOUNTER — Telehealth: Payer: Self-pay

## 2021-09-09 DIAGNOSIS — Z87891 Personal history of nicotine dependence: Secondary | ICD-10-CM | POA: Insufficient documentation

## 2021-09-09 NOTE — Telephone Encounter (Signed)
FYI Dr. Levada Schilling called from [facility] stated that in order to try to get CT lung cancer screening approved that abnormal PFT's were need along with chest xray with some type of symptoms.  I let him know that patient actually ended up not qualifying due not having 30 years total smoking. He will redraw the the request for coverage for now.

## 2021-09-09 NOTE — Telephone Encounter (Signed)
noted 

## 2021-09-11 NOTE — Telephone Encounter (Signed)
As per our discussion

## 2021-09-11 NOTE — Telephone Encounter (Signed)
Fyi Kathy Bryant Call pt  I want to ensure that we are on the same page  She quit smoking in 2020 however reports that she smoked infrequently  I had referred her to pulmonology for lung cancer screening with CT Chest and it declined from pulmonology as she didn't meet criteria  I then ordered Ct chest and [employer] will not cover unless having symptoms, we have ordered chest xray or performed pulmonary function test.  Please sch an appt to discuss CT chest with me. We can obtain CXR at that time and discuss symptoms. Let me know when scheduled

## 2021-09-21 ENCOUNTER — Ambulatory Visit: Payer: Medicare PPO | Admitting: Family

## 2021-09-28 ENCOUNTER — Telehealth: Payer: Self-pay | Admitting: Family

## 2021-09-28 ENCOUNTER — Other Ambulatory Visit: Payer: Self-pay

## 2021-09-28 ENCOUNTER — Ambulatory Visit: Payer: Medicare PPO | Admitting: Family

## 2021-09-28 ENCOUNTER — Encounter: Payer: Self-pay | Admitting: Family

## 2021-09-28 VITALS — BP 128/78 | HR 74 | Temp 98.1°F | Ht 63.0 in | Wt 197.2 lb

## 2021-09-28 DIAGNOSIS — E1165 Type 2 diabetes mellitus with hyperglycemia: Secondary | ICD-10-CM | POA: Diagnosis not present

## 2021-09-28 DIAGNOSIS — E785 Hyperlipidemia, unspecified: Secondary | ICD-10-CM

## 2021-09-28 DIAGNOSIS — K7460 Unspecified cirrhosis of liver: Secondary | ICD-10-CM | POA: Diagnosis not present

## 2021-09-28 DIAGNOSIS — Z87891 Personal history of nicotine dependence: Secondary | ICD-10-CM

## 2021-09-28 DIAGNOSIS — K8689 Other specified diseases of pancreas: Secondary | ICD-10-CM

## 2021-09-28 DIAGNOSIS — E538 Deficiency of other specified B group vitamins: Secondary | ICD-10-CM

## 2021-09-28 DIAGNOSIS — F419 Anxiety disorder, unspecified: Secondary | ICD-10-CM | POA: Diagnosis not present

## 2021-09-28 LAB — POCT GLYCOSYLATED HEMOGLOBIN (HGB A1C): Hemoglobin A1C: 7.2 % — AB (ref 4.0–5.6)

## 2021-09-28 MED ORDER — CYANOCOBALAMIN 1000 MCG/ML IJ SOLN: INTRAMUSCULAR | 15 refills | Status: DC

## 2021-09-28 MED ORDER — METFORMIN HCL ER 500 MG PO TB24: 1000.0000 mg | ORAL_TABLET | Freq: Every evening | ORAL | 3 refills | Status: DC

## 2021-09-28 NOTE — Assessment & Plan Note (Signed)
Lab Results  Component Value Date   HGBA1C 7.2 (A) 09/28/2021   Uncontrolled. Increase metformin to 1000mg  qd.

## 2021-09-28 NOTE — Progress Notes (Signed)
Subjective:    Patient ID: Kathy Bryant, female    DOB: [birth year], 76 y.o.   MRN: [id number]  CC: Kathy Bryant is a 76 y.o. female who presents today for follow up.   HPI: Discuss Ct chest  She has no CP, sob, wheezing.  quit smoking in 2020 however she had quit for 17 years prior. She is smoking now after stress with granddaughter. She intends to quit again. She states she spoke with pulmonology and does not qualify for CT lung cancer screening program.   referral was declined from pulmonology as she didn't meet criteria  I then ordered Ct chest as baseline and [employer] will not cover unless having symptoms, unless we have ordered chest xray or performed pulmonary function test.  GAD- controlled. very rare use of xanax. She would like keep prescription.     DM- compliant with metformin 500mg .   Cirrhosis-previously following with Dr Allen Norris; she has not made follow-up  HLD- compliant with pravastatin 20mg  one day per week.   B12 deficiency-she would like to continue B12 injections at home.  She feels comfortable doing these herself.  She would like to continue indefinitely  HISTORY:  Past Medical History:  Diagnosis Date   Anxiety    Bilateral calf pain    Diabetes mellitus without complication ([place])    PT STATES SHE WAS ON METFORMIN AND STOPPED TAKING THIS ON HER OWN   Fatty liver    Hepatitis 1960'S   B   Herniated disc, cervical    HTN (hypertension)    PT WAS ON BP MEDS AND THEN STATES HER BP WAS UNDER CONTROL AND SHE STOPPED TAKING BP ON HER OWN   Microscopic hematuria    Obesity    Swelling of both lower extremities    Past Surgical History:  Procedure Laterality Date   APPENDECTOMY  1968   BREAST BIOPSY Right 2013   x2 BENIGN BREAST TISSUE WITH FOCAL HYALINIZED STROMA AND BENIGN BREAST TISSUE WITH FOCAL FIBROADENOMATOUS CHANGE    CESAREAN SECTION  1972 & 1978   CHOLECYSTECTOMY     COLONOSCOPY  03/05/2014   COLONOSCOPY WITH PROPOFOL  N/A 04/27/2019   Procedure: COLONOSCOPY WITH PROPOFOL;  Surgeon: Lucilla Lame, MD;  Location: [facility];  Service: Endoscopy;  Laterality: N/A;   ESOPHAGOGASTRODUODENOSCOPY (EGD) WITH PROPOFOL N/A 04/27/2019   Procedure: ESOPHAGOGASTRODUODENOSCOPY (EGD) WITH PROPOFOL;  Surgeon: Lucilla Lame, MD;  Location: [facility];  Service: Endoscopy;  Laterality: N/A;   GALLBLADDER SURGERY  1982   GASTRIC RESTRICTION SURGERY  1980   'stomach stapled'   HEMORRHOID SURGERY N/A 12/23/2016   Procedure: HEMORRHOIDECTOMY;  Surgeon: Christene Lye, MD;  Location: [facility];  Service: General;  Laterality: N/A;   HERNIA REPAIR     Abdominal   POLYPECTOMY N/A 04/27/2019   Procedure: POLYPECTOMY;  Surgeon: Lucilla Lame, MD;  Location: [facility];  Service: Endoscopy;  Laterality: N/A;   TOTAL ABDOMINAL HYSTERECTOMY W/ BILATERAL SALPINGOOPHORECTOMY  1998   Family History  Problem Relation Age of Onset   Diabetes Mother    Cancer Mother 6       Brain Tumor - died age 65   Heart disease Father        CAD - died MI - age 23   Hypertension Father    High Cholesterol Father    Diabetes Sister    Diabetes Brother    Diabetes Brother    Cancer Paternal Aunt  Renal Cell Ca   Cancer Paternal Uncle        Renal Cell Ca   Breast cancer Neg Hx     Allergies: Simvastatin Current Outpatient Medications on File Prior to Visit  Medication Sig Dispense Refill   ALPRAZolam (XANAX) 0.25 MG tablet Take 1 tablet (0.25 mg total) by mouth 2 (two) times daily as needed. for anxiety 30 tablet 1   losartan (COZAAR) 25 MG tablet Take 1 tablet (25 mg total) by mouth daily. 90 tablet 1   pravastatin (PRAVACHOL) 20 MG tablet Take 1 tablet (20 mg total) by mouth once a week. 12 tablet 1   No current facility-administered medications on file prior to visit.    Social History   Tobacco Use   Smoking status: Every Day    Types: Cigarettes    Start date: 05/24/2019    Smokeless tobacco: Never   Tobacco comments:    "I WAS A CLOSET SMOKER AND NEVER SMOKED ALOT"  Vaping Use   Vaping Use: Never used  Substance Use Topics   Alcohol use: Yes    Comment: Occasional glass of wine   Drug use: No    Review of Systems  Constitutional:  Negative for chills and fever.  Respiratory:  Negative for cough.   Cardiovascular:  Negative for chest pain and palpitations.  Gastrointestinal:  Negative for nausea and vomiting.     Objective:    BP 128/78 (BP Location: Left Arm, Patient Position: Sitting, Cuff Size: Normal)    Pulse 74    Temp 98.1 F (36.7 C) (Oral)    Ht 5\' 3"  (1.6 m)    Wt 197 lb 3.2 oz (89.4 kg)    SpO2 96%    BMI 34.93 kg/m  BP Readings from Last 3 Encounters:  09/28/21 128/78  07/14/21 120/78  06/16/20 107/67   Wt Readings from Last 3 Encounters:  09/28/21 197 lb 3.2 oz (89.4 kg)  08/27/21 199 lb (90.3 kg)  07/14/21 199 lb 6 oz (90.4 kg)    Physical Exam Vitals reviewed.  Constitutional:      Appearance: She is well-developed.  Eyes:     Conjunctiva/sclera: Conjunctivae normal.  Cardiovascular:     Rate and Rhythm: Normal rate and regular rhythm.     Pulses: Normal pulses.     Heart sounds: Normal heart sounds.  Pulmonary:     Effort: Pulmonary effort is normal.     Breath sounds: Normal breath sounds. No wheezing, rhonchi or rales.  Skin:    General: Skin is warm and dry.  Neurological:     Mental Status: She is alert.  Psychiatric:        Speech: Speech normal.        Behavior: Behavior normal.        Thought Content: Thought content normal.       Assessment & Plan:   Problem List Items Addressed This Visit       Digestive   Cirrhosis of liver ([place])    Liver enzymes have remained largely normal.  No thrombocytopenia.  Advised patient of annual follow-up with gastroenterology.  I have a call out to Dr. Lynnell Jude office to schedule.        Endocrine   DM (diabetes mellitus) ([place]) - Primary    Lab Results   Component Value Date   HGBA1C 7.2 (A) 09/28/2021  Uncontrolled. Increase metformin to 1000mg  qd.      Relevant Medications   metFORMIN (GLUCOPHAGE XR) 500 MG 24  hr tablet   Other Relevant Orders   POCT HgB A1C (Completed)   Lipid panel     Other   Anxiety    Chronic, stable.  Continue as needed use of Xanax 0.25 mg.  Very rare use      B12 deficiency    Chronic, stable.  Continue B12 injections at home indefinitely.      Relevant Medications   cyanocobalamin (,VITAMIN B-12,) 1000 MCG/ML injection   Former smoker    quit smoking in 2020 however she had quit for 17 years prior. She is smoking now after stress with granddaughter. She intends to quit again. She states she spoke with pulmonology and does not qualify for CT lung cancer screening program.   referral was declined from pulmonology as she didn't meet criteria   I then ordered Ct chest as baseline and [employer] will not cover unless having symptoms, unless we have ordered chest xray or performed pulmonary function test.  Patient denies shortness of breath, wheezing.  She declines any further evaluation including a chest x-ray which offered today.   will follow      HLD (hyperlipidemia)    Advised patient to increase pravastatin 20 mg with goal of approaching daily.  Pending lipid panel      Pancreatic mass    Again discussed with patient the importance of follow up with GI for surveillance. We are scheduling a follow up for patient. Will follow        I have changed Lillieanna R. Steffek's metFORMIN. I am also having her start on cyanocobalamin. Additionally, I am having her maintain her ALPRAZolam, pravastatin, and losartan.   Meds ordered this encounter  Medications   cyanocobalamin (,VITAMIN B-12,) 1000 MCG/ML injection    Sig: 1000 mcg (1 mL) intramuscular injection in the thigh ( vastus lateralis)  once per month.    Dispense:  1 mL    Refill:  15    Order Specific Question:   Supervising Provider    Answer:    Crecencio Mc [[id number]]   metFORMIN (GLUCOPHAGE XR) 500 MG 24 hr tablet    Sig: Take 2 tablets (1,000 mg total) by mouth every evening.    Dispense:  60 tablet    Refill:  3    Order Specific Question:   Supervising Provider    Answer:   Crecencio Mc [[id number]]    Return precautions given.   Risks, benefits, and alternatives of the medications and treatment plan prescribed today were discussed, and patient expressed understanding.   Education regarding symptom management and diagnosis given to patient on AVS.  Continue to follow with Burnard Hawthorne, FNP for routine health maintenance.   Kathy Bryant and I agreed with plan.   [name], FNP

## 2021-09-28 NOTE — Assessment & Plan Note (Signed)
quit smoking in 2020 however she had quit for 17 years prior. She is smoking now after stress with granddaughter. She intends to quit again. She states she spoke with pulmonology and does not qualify for CT lung cancer screening program.   referral was declined from pulmonology as she didn't meet criteria   I then ordered Ct chest as baseline and [employer] will not cover unless having symptoms, unless we have ordered chest xray or performed pulmonary function test.  Patient denies shortness of breath, wheezing.  She declines any further evaluation including a chest x-ray which offered today.   will follow

## 2021-09-28 NOTE — Assessment & Plan Note (Signed)
Chronic, stable.  Continue B12 injections at home indefinitely.

## 2021-09-28 NOTE — Assessment & Plan Note (Signed)
Advised patient to increase pravastatin 20 mg with goal of approaching daily.  Pending lipid panel

## 2021-09-28 NOTE — Assessment & Plan Note (Signed)
Liver enzymes have remained largely normal.  No thrombocytopenia.  Advised patient of annual follow-up with gastroenterology.  I have a call out to Dr. Lynnell Jude office to schedule.

## 2021-09-28 NOTE — Telephone Encounter (Signed)
Discussed 09/28/21 °

## 2021-09-28 NOTE — Patient Instructions (Addendum)
Please try to increase pravachol more than once per week.  Ideally 7 days/week  Increase metformin to 2 tablets at night

## 2021-09-28 NOTE — Assessment & Plan Note (Signed)
Chronic, stable.  Continue as needed use of Xanax 0.25 mg.  Very rare use

## 2021-09-28 NOTE — Telephone Encounter (Signed)
Please call [facility], Dr. Dorothey Baseman office.  Patient is overdue for follow-up for cirrhosis and also surveillance of pancreatic lesion.  Please make an appointment to discuss these 2 items.

## 2021-09-28 NOTE — Assessment & Plan Note (Signed)
Again discussed with patient the importance of follow up with GI for surveillance. We are scheduling a follow up for patient. Will follow

## 2021-09-29 NOTE — Telephone Encounter (Signed)
Is pt going to have the CT? Please advise and Thank you!

## 2021-09-29 NOTE — Telephone Encounter (Signed)
Kathy Bryant Pt declines any imaging of lungs You may cancel

## 2021-10-01 ENCOUNTER — Other Ambulatory Visit: Payer: Self-pay

## 2021-10-01 DIAGNOSIS — K7460 Unspecified cirrhosis of liver: Secondary | ICD-10-CM

## 2021-10-05 NOTE — Telephone Encounter (Signed)
ATTEMPTED TO LEAVE MESSAGE BU WAS DISCONNECTED. WILL CALL BACK AT LATER DATE.

## 2021-10-07 NOTE — Telephone Encounter (Signed)
Have not been successful in getting through to speak to someone to schedule appointment. ?

## 2021-10-12 ENCOUNTER — Telehealth: Payer: Self-pay | Admitting: Family

## 2021-10-12 ENCOUNTER — Other Ambulatory Visit: Payer: Self-pay

## 2021-10-12 NOTE — Telephone Encounter (Signed)
I spoke with patient & she stated that she had what she thought was a stye on her eye. She said that she has been getting off & on since having cataract surgery last year. She now said that she has what she thought was one in the corner of her eye but now looks different. It is red, swollen and underneath very watery. It is in right eye only. She has also contacted the eye doctor that did her surgery but she has not heard back. She wanted a drop that was prescribed in that past but I could not find in her chart. I stated that she would require an appointment anyway. She is scheduled tomorrow at 9:30 in the morning  ?

## 2021-10-12 NOTE — Telephone Encounter (Signed)
LMTCB

## 2021-10-12 NOTE — Telephone Encounter (Signed)
Pt called in requesting refill on medication for eye drops. Pt didn't have the name of medication. Pt requesting callback.  ?

## 2021-10-12 NOTE — Telephone Encounter (Signed)
Called today but was unsuccessful in getting through to schedule appointment.  ?

## 2021-10-13 ENCOUNTER — Other Ambulatory Visit: Payer: Self-pay

## 2021-10-13 ENCOUNTER — Encounter: Payer: Self-pay | Admitting: Family

## 2021-10-13 ENCOUNTER — Ambulatory Visit: Payer: Medicare PPO | Admitting: Family

## 2021-10-13 VITALS — BP 122/70 | HR 86 | Temp 98.1°F | Ht 62.0 in | Wt 194.2 lb

## 2021-10-13 DIAGNOSIS — H01001 Unspecified blepharitis right upper eyelid: Secondary | ICD-10-CM | POA: Diagnosis not present

## 2021-10-13 DIAGNOSIS — H109 Unspecified conjunctivitis: Secondary | ICD-10-CM | POA: Insufficient documentation

## 2021-10-13 MED ORDER — ERYTHROMYCIN 5 MG/GM OP OINT: TOPICAL_OINTMENT | OPHTHALMIC | 0 refills | Status: DC

## 2021-10-13 NOTE — Patient Instructions (Addendum)
Concern for stye, complicated by bacterial conjunctivitis ( 'pink eye') and  blepharitis . We will start antibiotic ointment today as agreed today we will closely monitor you and if any worsening including changes in vision, worsening swelling or redness development of fever, please let me know right away as I would like for you to see your ophthalmologist, Dr. Gershon Crane.  ? ?Right now I would advise no make-up,  gentle lid washing and continued warm compresses. ? ?Let me know how you are doing. ? ? ?

## 2021-10-13 NOTE — Assessment & Plan Note (Addendum)
Presentation most consistent with bacterial conjunctivitis of right eye with stye.  We did discuss blepharitis with patient as well. I advised that we also arrange consult with  ophthalmology Dr Gershon Crane to ensure further evaluation is not warranted. Patient adamantly declines and feels comfortable starting erythromycin ointment today with close monitoring. Advised to continue warm compresses, avoid make up/skin products and to call me right away if she develops eye pain,photophobia, changes in vision or worsening eyelid pain, swelling or redness. She verbalized understanding of all.  ?

## 2021-10-13 NOTE — Progress Notes (Signed)
Subjective:    Patient ID: Kathy Bryant, female    DOB: 02-06-1946, 76 y.o.   MRN: [id number]  CC: Kathy Bryant is a 76 y.o. female who presents today for an acute visit.    HPI: Acute visit Right eyelid upper eyelid is swollen x 5 days, improved today Right eye redness.  Underneath right eye had been swollen, improved this morning. This morning eye was sealed shut and she has noted clear discharge after she cleaned eye/eyelashes.  She has been using heat compresses with relief. She has been using OTC 'STYE' cream without relief. No vision changes, gritty sensation, loss of vision, eye pain, fever, HA, photosensitivity No foreign body sensation.  She doesn't wear contacts.  Last eye exam reported in the last 6 months, Dr Gershon Crane in [place].  No new make up and she has since got rid of mascara.  No new lotions , creams, fake eyelashes   She has h/o of stye around her eyes.  Cataract surgery 01/2021   HISTORY:  Past Medical History:  Diagnosis Date   Anxiety    Bilateral calf pain    Diabetes mellitus without complication ([place])    PT STATES SHE WAS ON METFORMIN AND STOPPED TAKING THIS ON HER OWN   Fatty liver    Hepatitis 1960'S   B   Herniated disc, cervical    HTN (hypertension)    PT WAS ON BP MEDS AND THEN STATES HER BP WAS UNDER CONTROL AND SHE STOPPED TAKING BP ON HER OWN   Microscopic hematuria    Obesity    Swelling of both lower extremities    Past Surgical History:  Procedure Laterality Date   APPENDECTOMY  1968   BREAST BIOPSY Right 2013   x2 BENIGN BREAST TISSUE WITH FOCAL HYALINIZED STROMA AND BENIGN BREAST TISSUE WITH FOCAL FIBROADENOMATOUS CHANGE    CESAREAN SECTION  1972 & 1978   CHOLECYSTECTOMY     COLONOSCOPY  03/05/2014   COLONOSCOPY WITH PROPOFOL N/A 04/27/2019   Procedure: COLONOSCOPY WITH PROPOFOL;  Surgeon: Lucilla Lame, MD;  Location: [facility];  Service: Endoscopy;  Laterality: N/A;   ESOPHAGOGASTRODUODENOSCOPY (EGD) WITH  PROPOFOL N/A 04/27/2019   Procedure: ESOPHAGOGASTRODUODENOSCOPY (EGD) WITH PROPOFOL;  Surgeon: Lucilla Lame, MD;  Location: [facility];  Service: Endoscopy;  Laterality: N/A;   GALLBLADDER SURGERY  1982   GASTRIC RESTRICTION SURGERY  1980   'stomach stapled'   HEMORRHOID SURGERY N/A 12/23/2016   Procedure: HEMORRHOIDECTOMY;  Surgeon: Christene Lye, MD;  Location: [facility];  Service: General;  Laterality: N/A;   HERNIA REPAIR     Abdominal   POLYPECTOMY N/A 04/27/2019   Procedure: POLYPECTOMY;  Surgeon: Lucilla Lame, MD;  Location: [facility];  Service: Endoscopy;  Laterality: N/A;   TOTAL ABDOMINAL HYSTERECTOMY W/ BILATERAL SALPINGOOPHORECTOMY  1998   Family History  Problem Relation Age of Onset   Diabetes Mother    Cancer Mother 71       Brain Tumor - died age 30   Heart disease Father        CAD - died MI - age 90   Hypertension Father    High Cholesterol Father    Diabetes Sister    Diabetes Brother    Diabetes Brother    Cancer Paternal Aunt        Renal Cell Ca   Cancer Paternal Uncle        Renal Cell Ca   Breast cancer Neg Hx  Allergies: Simvastatin Current Outpatient Medications on File Prior to Visit  Medication Sig Dispense Refill   ALPRAZolam (XANAX) 0.25 MG tablet Take 1 tablet (0.25 mg total) by mouth 2 (two) times daily as needed. for anxiety 30 tablet 1   cyanocobalamin (,VITAMIN B-12,) 1000 MCG/ML injection 1000 mcg (1 mL) intramuscular injection in the thigh ( vastus lateralis)  once per month. 1 mL 15   losartan (COZAAR) 25 MG tablet Take 1 tablet (25 mg total) by mouth daily. 90 tablet 1   metFORMIN (GLUCOPHAGE XR) 500 MG 24 hr tablet Take 2 tablets (1,000 mg total) by mouth every evening. 60 tablet 3   pravastatin (PRAVACHOL) 20 MG tablet Take 1 tablet (20 mg total) by mouth once a week. 12 tablet 1   No current facility-administered medications on file prior to visit.    Social History   Tobacco Use   Smoking status:  Every Day    Types: Cigarettes    Start date: 05/24/2019   Smokeless tobacco: Never   Tobacco comments:    "I WAS A CLOSET SMOKER AND NEVER SMOKED ALOT"  Vaping Use   Vaping Use: Never used  Substance Use Topics   Alcohol use: Yes    Comment: Occasional glass of wine   Drug use: No    Review of Systems  Constitutional:  Negative for chills and fever.  HENT:  Negative for congestion.   Eyes:  Positive for pain (eyelid pain), discharge and redness. Negative for photophobia, itching and visual disturbance.  Respiratory:  Negative for cough.   Cardiovascular:  Negative for chest pain and palpitations.  Gastrointestinal:  Negative for nausea and vomiting.  Neurological:  Negative for headaches.     Objective:    BP 122/70 (BP Location: Left Arm, Patient Position: Sitting, Cuff Size: Normal)    Pulse 86    Temp 98.1 F (36.7 C) (Temporal)    Ht '5\' 2"'$  (1.575 m)    Wt 194 lb 3.2 oz (88.1 kg)    SpO2 96%    BMI 35.52 kg/m    Physical Exam Vitals reviewed.  Constitutional:      Appearance: She is well-developed.  HENT:     Head: Normocephalic and atraumatic.     Right Ear: Hearing, tympanic membrane, ear canal and external ear normal. No decreased hearing noted. No drainage, swelling or tenderness. No middle ear effusion. No foreign body. Tympanic membrane is not erythematous or bulging.     Left Ear: Hearing, tympanic membrane, ear canal and external ear normal. No decreased hearing noted. No drainage, swelling or tenderness.  No middle ear effusion. No foreign body. Tympanic membrane is not erythematous or bulging.     Nose: No rhinorrhea.     Right Sinus: No maxillary sinus tenderness or frontal sinus tenderness.     Left Sinus: No maxillary sinus tenderness or frontal sinus tenderness.     Mouth/Throat:     Pharynx: Uvula midline. No oropharyngeal exudate or posterior oropharyngeal erythema.     Tonsils: No tonsillar abscesses.  Eyes:     General: Lids are everted, no foreign  bodies appreciated. No scleral icterus.       Right eye: Hordeolum present. No discharge.        Left eye: No discharge or hordeolum.     Conjunctiva/sclera:     Right eye: Right conjunctiva is not injected. No hemorrhage.    Left eye: Left conjunctiva is not injected. No hemorrhage.    Pupils: Pupils are equal,  round, and reactive to light.      Comments: external eye lesion right eye with tender edema. No firm or rubbery nodule. No drainage. Eye lesion with surrounding erythema. skin intact.   Right eye:   Diffuse injection of the conjunctiva. No white spots, opacity, or foreign body appreciated. No collection of blood or pus in the anterior chamber. No ciliary flush surrounding iris.   No photophobia or eye pain appreciated during exam.   Cardiovascular:     Rate and Rhythm: Regular rhythm.     Pulses: Normal pulses.     Heart sounds: Normal heart sounds.  Pulmonary:     Effort: Pulmonary effort is normal.     Breath sounds: Normal breath sounds. No wheezing, rhonchi or rales.  Lymphadenopathy:     Head:     Right side of head: No submental, submandibular, tonsillar, preauricular, posterior auricular or occipital adenopathy.     Left side of head: No submental, submandibular, tonsillar, preauricular, posterior auricular or occipital adenopathy.     Cervical: No cervical adenopathy.  Skin:    General: Skin is warm and dry.  Neurological:     Mental Status: She is alert.  Psychiatric:        Speech: Speech normal.        Behavior: Behavior normal.        Thought Content: Thought content normal.       Assessment & Plan:   Problem List Items Addressed This Visit       Other   Bacterial conjunctivitis of right eye - Primary    Presentation most consistent with bacterial conjunctivitis of right eye with stye.  We did discuss blepharitis with patient as well. I advised that we also arrange consult with  ophthalmology Dr Gershon Crane to ensure further evaluation is not warranted.  Patient adamantly declines and feels comfortable starting erythromycin ointment today with close monitoring. Advised to continue warm compresses, avoid make up/skin products and to call me right away if she develops eye pain, changes in vision or worsening eyelid pain, swelling or redness. She verbalized understanding of all.       Relevant Medications   erythromycin ophthalmic ointment      I am having Kathy Bryant start on erythromycin. I am also having her maintain her ALPRAZolam, pravastatin, losartan, cyanocobalamin, and metFORMIN.   Meds ordered this encounter  Medications   erythromycin ophthalmic ointment    Sig: Use one half inch four times daily to affected eye (s) x 7 days.    Dispense:  3.5 g    Refill:  0    Order Specific Question:   Supervising Provider    Answer:   Crecencio Mc [[id number]]    Return precautions given.   Risks, benefits, and alternatives of the medications and treatment plan prescribed today were discussed, and patient expressed understanding.   Education regarding symptom management and diagnosis given to patient on AVS.  Continue to follow with Burnard Hawthorne, FNP for routine health maintenance.   Kathy Bryant and I agreed with plan.   [name], FNP

## 2021-10-14 ENCOUNTER — Telehealth: Payer: Self-pay | Admitting: Family

## 2021-10-14 NOTE — Telephone Encounter (Signed)
As discussed ?Please document your conversation with patient ?

## 2021-10-19 ENCOUNTER — Other Ambulatory Visit: Payer: Medicare PPO

## 2021-10-19 NOTE — Telephone Encounter (Signed)
noted 

## 2021-10-22 ENCOUNTER — Other Ambulatory Visit: Payer: Medicare PPO

## 2021-10-27 NOTE — Telephone Encounter (Signed)
FYI jenate ? ?Ginger, hope you are well.  ?This mutual  pt is due for f/u with dr Allen Norris for  ? ?cirrhosis and also surveillance of pancreatic lesion.  ? ?Do you mind making her an appt and letting her know?  ? ?We have not  been able to schedule for her.  ? ? ?Thank you! ? ?

## 2021-10-28 NOTE — Telephone Encounter (Signed)
Left message at Dr. Dorothey Baseman office to call back to get [facility] Kathy Bryant scheduled for appointment ?

## 2021-11-03 ENCOUNTER — Telehealth: Payer: Self-pay | Admitting: Family

## 2021-11-03 DIAGNOSIS — F419 Anxiety disorder, unspecified: Secondary | ICD-10-CM

## 2021-11-03 NOTE — Telephone Encounter (Signed)
Pt called in requesting for refill on medication (ALPRAZolam (XANAX) 0.25 MG tablet)... Pt stated that she tried to refill medication with pharmacy and they advise her that she doesn't have a standing order... Pt requesting callback...  ?

## 2021-11-03 NOTE — Telephone Encounter (Signed)
Pt has been scheduled, but pt states she is claustrophobic.Kathy KitchenMarland Bryant I will call [facility] to find out if [place] location has open MRI ?

## 2021-11-04 NOTE — Telephone Encounter (Signed)
I spoke to pt, MRI reordered for [facility] [place] and she is aware that they can put her feet 1st and they will call her to schedule ?

## 2021-11-06 MED ORDER — ALPRAZOLAM 0.25 MG PO TABS: 0.2500 mg | ORAL_TABLET | Freq: Two times a day (BID) | ORAL | 1 refills | Status: DC | PRN

## 2021-11-06 NOTE — Telephone Encounter (Signed)
Refilled ?I looked up patient on [place] Controlled Substances Reporting System PMP AWARE and saw no activity that raised concern of inappropriate use.  ? ?

## 2021-11-06 NOTE — Addendum Note (Signed)
Addended by: Burnard Hawthorne on: 11/06/2021 12:20 PM ? ? Modules accepted: Orders ? ?

## 2021-11-13 ENCOUNTER — Other Ambulatory Visit: Payer: Medicare PPO

## 2021-11-26 ENCOUNTER — Ambulatory Visit: Payer: Medicare PPO | Admitting: Gastroenterology

## 2021-11-26 ENCOUNTER — Encounter: Payer: Self-pay | Admitting: Gastroenterology

## 2021-11-26 ENCOUNTER — Ambulatory Visit
Admission: RE | Admit: 2021-11-26 | Discharge: 2021-11-26 | Disposition: A | Discharge status: Home / Self Care | Payer: Medicare PPO | Source: Ambulatory Visit | Attending: Gastroenterology | Admitting: Gastroenterology

## 2021-11-26 VITALS — BP 102/61 | HR 86 | Temp 98.4°F | Wt 192.0 lb

## 2021-11-26 DIAGNOSIS — K449 Diaphragmatic hernia without obstruction or gangrene: Secondary | ICD-10-CM

## 2021-11-26 DIAGNOSIS — Z9049 Acquired absence of other specified parts of digestive tract: Secondary | ICD-10-CM | POA: Diagnosis not present

## 2021-11-26 DIAGNOSIS — K862 Cyst of pancreas: Secondary | ICD-10-CM | POA: Diagnosis not present

## 2021-11-26 DIAGNOSIS — K7460 Unspecified cirrhosis of liver: Secondary | ICD-10-CM | POA: Diagnosis not present

## 2021-11-26 DIAGNOSIS — K760 Fatty (change of) liver, not elsewhere classified: Secondary | ICD-10-CM | POA: Diagnosis not present

## 2021-11-26 IMAGING — MR MR ABDOMEN WO/W CM
17 series · 46 of 48 positions shown · IV contrast (18 mL Multihance)
Comparison: Abdominal MRI [DATE].

CLINICAL DATA: 75-year-old female with history of cirrhosis.
Evaluate for potential hepatocellular carcinoma.

EXAM:
MRI ABDOMEN WITHOUT AND WITH CONTRAST
TECHNIQUE: Multiplanar multisequence MR imaging of the abdomen was performed
both before and after the administration of intravenous contrast.
CONTRAST:  18mL MULTIHANCE GADOBENATE DIMEGLUMINE 529 MG/ML IV SOLN

[Series 2: T2 · coronal · 5.0mm · 1.56mm/px · 2 of 36 slices shown (1 of 2)]
[im 1/36]
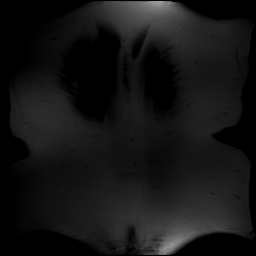
[im 36/36]
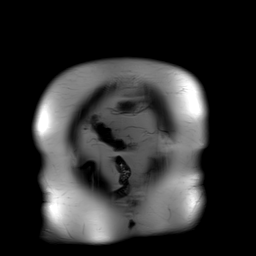

[Series 3: T2 fat-sat · axial · 7.0mm · 1.48mm/px · z∈[-62,+215]mm · 2 of 34 slices shown]
[im 1/34]
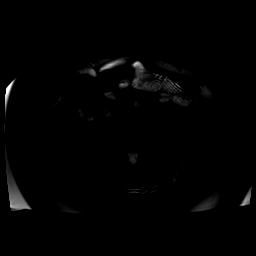
[im 34/34]
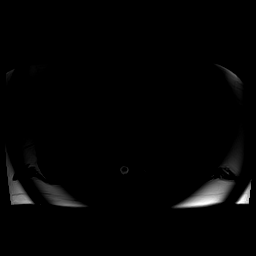

[Series 4: T1 · axial · 6.0mm · 0.78mm/px · z∈[-76,+176]mm · 4 of 72 slices shown]
[im 1/72]
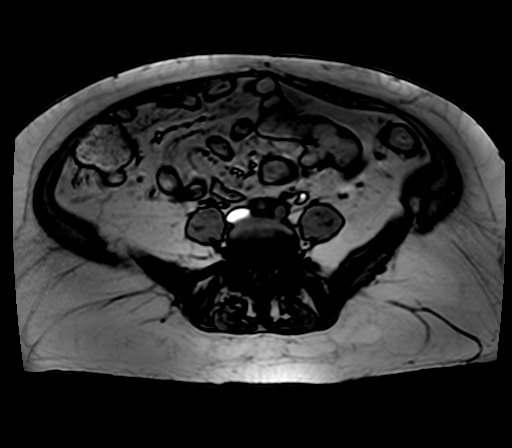
[im 24/72]
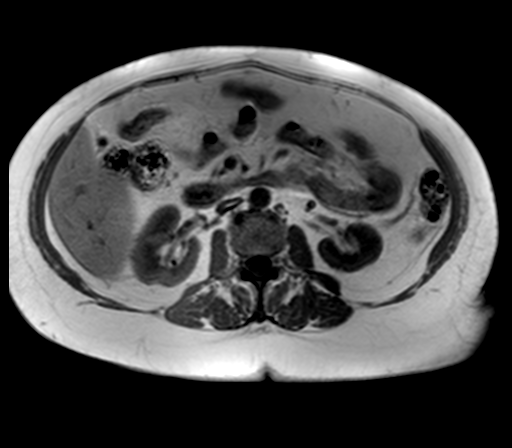
[im 48/72]
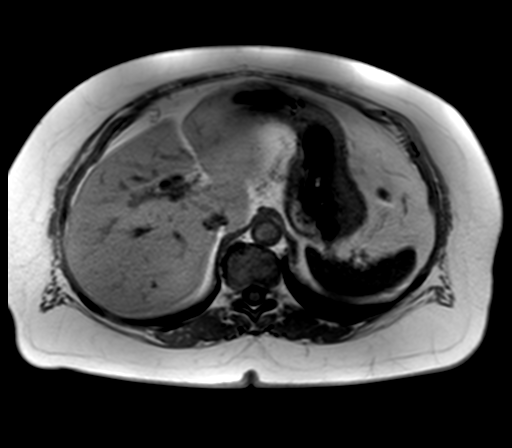
[im 72/72]
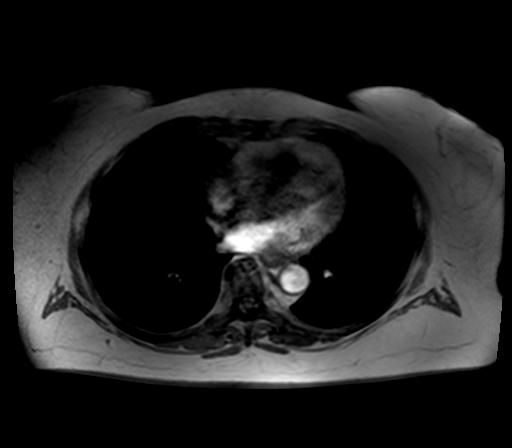

[Series 5: DWI · axial · 6.0mm · 1.49mm/px · z∈[-51,+208]mm · 5 of 111 slices shown (1 of 2)]
[im 1/111]
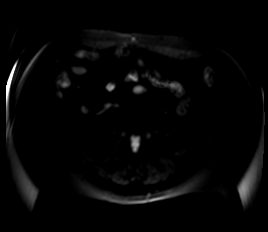
[im 28/111]
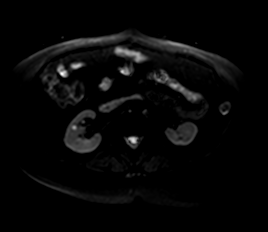
[im 56/111]
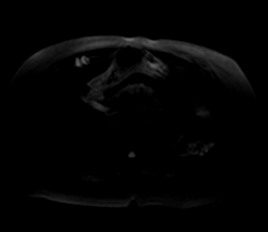
[im 83/111]
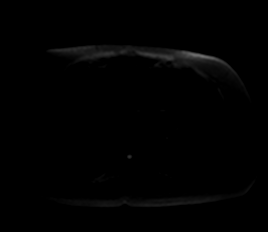
[im 111/111]
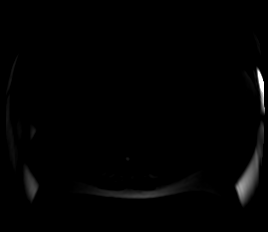

[Series 6: DWI · axial · 6.0mm · 1.49mm/px · z∈[-51,+208]mm · 2 of 37 slices shown (2 of 2)]
[im 1/37]
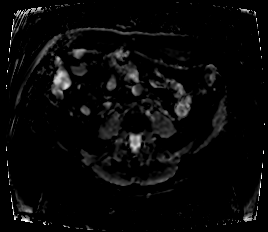
[im 37/37]
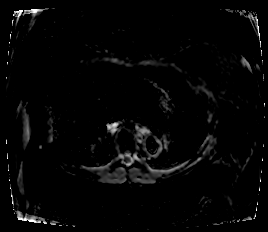

[Series 7: bSSFP · axial · 6.0mm · 1.25mm/px · z∈[-67,+199]mm · 2 of 38 slices shown]
[im 1/38]
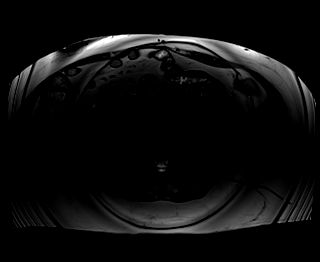
[im 38/38]
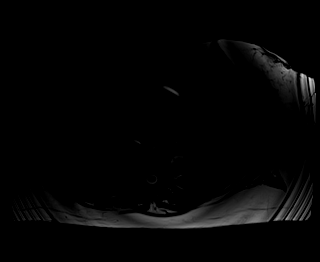

[Series 8: T1 dynamic · axial · non-contrast · 3.0mm · 1.25mm/px · z∈[-77,+183]mm · 3 of 88 slices shown]
[im 1/88]
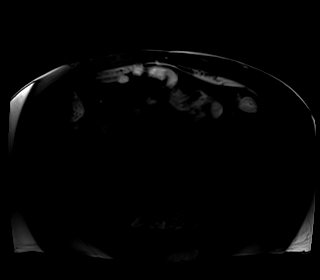
[im 44/88]
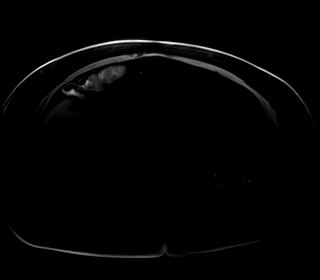
[im 88/88]
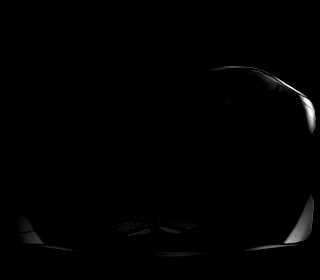

[Series 9: T1 dynamic post-contrast · axial · 3.0mm · 1.25mm/px · z∈[-77,+183]mm · 3 of 88 slices shown (1 of 9)]
[im 1/88]
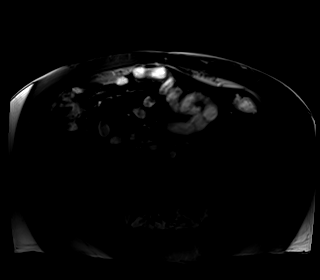
[im 44/88]
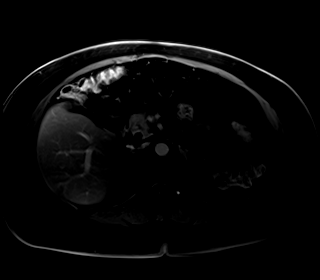
[im 88/88]
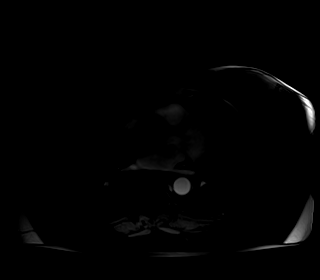

[Series 10: T1 dynamic post-contrast · axial · 3.0mm · 1.25mm/px · z∈[-77,+183]mm · 3 of 88 slices shown (2 of 9)]
[im 1/88]
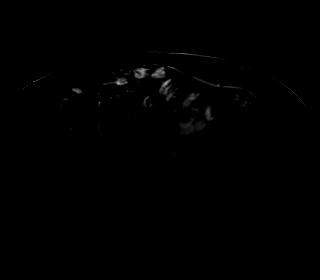
[im 44/88]
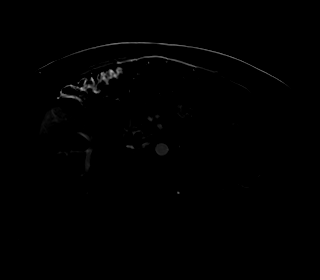
[im 88/88]
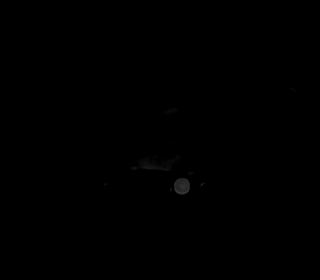

[Series 11: T1 dynamic post-contrast · axial · 3.0mm · 1.25mm/px · z∈[-77,+183]mm · 3 of 88 slices shown (3 of 9)]
[im 1/88]
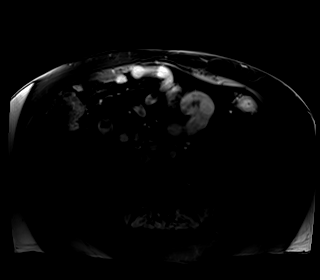
[im 44/88]
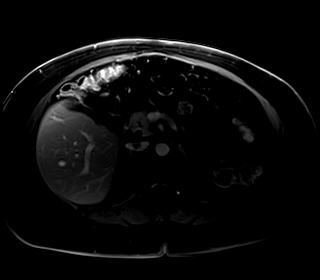
[im 88/88]
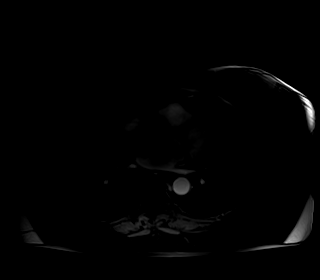

[Series 12: T1 dynamic post-contrast · axial · 3.0mm · 1.25mm/px · z∈[-77,+183]mm · 3 of 88 slices shown (4 of 9)]
[im 1/88]
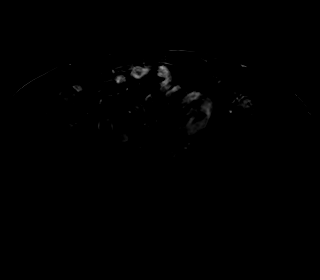
[im 44/88]
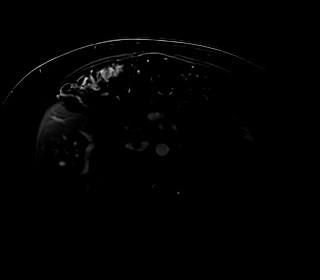
[im 88/88]
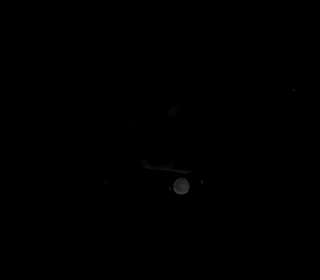

[Series 13: T1 dynamic post-contrast · axial · 3.0mm · 1.25mm/px · z∈[-77,+183]mm · 3 of 88 slices shown (5 of 9)]
[im 1/88]
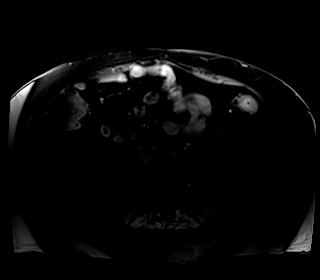
[im 44/88]
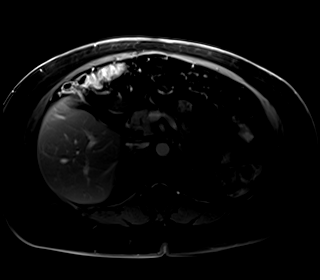
[im 88/88]
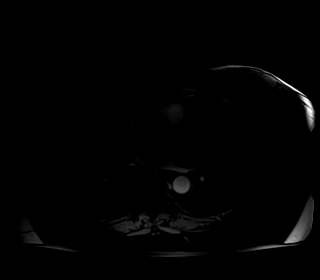

[Series 14: T1 dynamic post-contrast · axial · 3.0mm · 1.25mm/px · z∈[-77,+183]mm · 3 of 88 slices shown (6 of 9)]
[im 1/88]
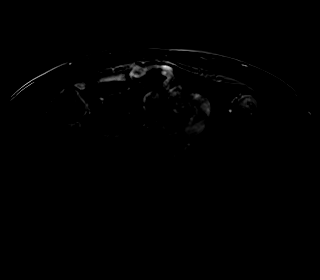
[im 44/88]
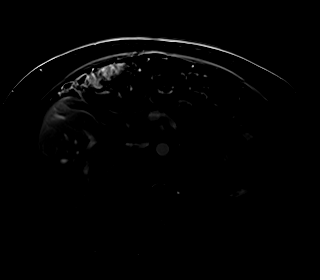
[im 88/88]
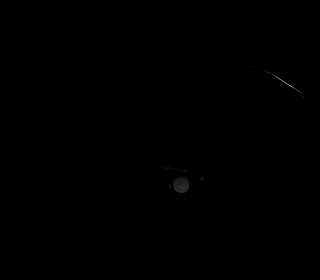

[Series 16: T2 · axial · 6.0mm · 1.25mm/px · 1 of 35 slices shown (2 of 2)]
[im 1/35]
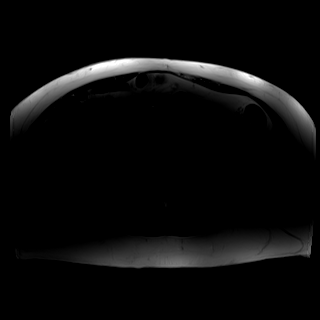

[Series 17: T1 dynamic post-contrast · axial · 3.0mm · 1.25mm/px · z∈[-77,+183]mm · 3 of 88 slices shown (7 of 9)]
[im 1/88]
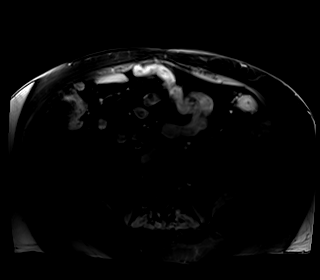
[im 44/88]
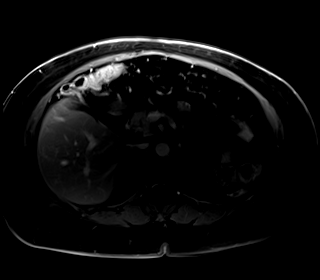
[im 88/88]
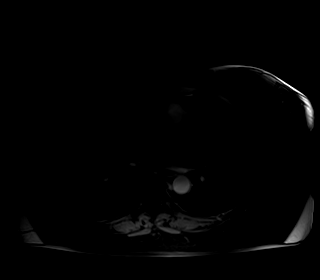

[Series 18: T1 dynamic post-contrast · axial · 3.0mm · 1.25mm/px · z∈[-77,+183]mm · 3 of 88 slices shown (8 of 9)]
[im 1/88]
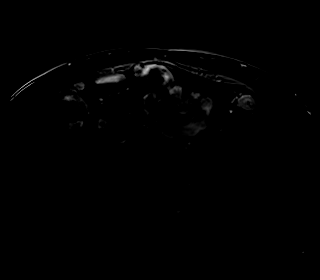
[im 44/88]
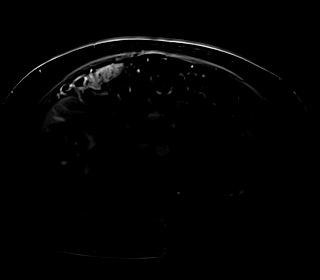
[im 88/88]
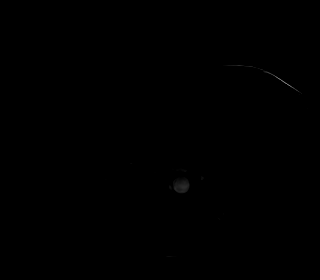

[Series 19: T1 dynamic post-contrast · coronal · 3.0mm · 1.38mm/px · 1 of 88 slices shown (9 of 9)]
[im 1/88]
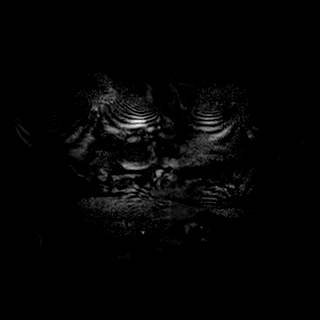

[46 of 48 positions shown; findings below may reference images not displayed]

FINDINGS: Lower chest: Unremarkable.

Hepatobiliary: Severe diffuse loss of signal intensity throughout
the hepatic parenchyma on out of phase dual echo images, indicative
of a background of severe hepatic steatosis. Liver also has a
shrunken appearance and slightly nodular contour, indicative of
underlying cirrhosis. No suspicious cystic or solid hepatic lesions
are noted. Specifically, no definite hypervascular lesion identified
to suggest hepatocellular carcinoma at this time. No intra or
extrahepatic biliary ductal dilatation. Common bile duct measures 6
mm in the porta hepatis. Status post cholecystectomy.

Pancreas: In the medial aspect of the pancreatic head (axial image
20 of series 3) there is a 6 mm T1 hypointense, T2 hyperintense,
nonenhancing lesion, likely to represent a tiny pancreatic
pseudocyst or small side branch IPMN (intraductal papillary mucinous
neoplasm). This does not appear associated with the main pancreatic
duct. No pancreatic ductal dilatation. No other suspicious appearing
pancreatic mass. No peripancreatic fluid collections or inflammatory
changes.

Spleen:  Unremarkable.

Adrenals/Urinary Tract: Subcentimeter T1 hypointense, T2
hyperintense, nonenhancing lesions in both kidneys, compatible with
tiny simple cysts. No hydroureteronephrosis in the visualized
portions of the abdomen. Bilateral adrenal glands are normal in
appearance.

Stomach/Bowel: Visualized portions are unremarkable.

Vascular/Lymphatic: No aneurysm identified in the visualized
abdominal vasculature. No lymphadenopathy noted in the abdomen.

Other: No significant volume of ascites noted in the visualized
portions of the peritoneal cavity.

Musculoskeletal: No aggressive appearing osseous lesions are noted
in the visualized portions of the skeleton.
IMPRESSION: 1. Hepatic steatosis and mild hepatic cirrhosis redemonstrated,
without evidence to suggest hepatocellular carcinoma at this time.
2. Stable 6 mm cystic lesion in the medial aspect of the pancreatic
head, favored to represent a benign lesions such as a small
pancreatic pseudocyst or side branch IPMN (intraductal papillary
mucinous neoplasm). Repeat abdominal MRI with and without IV
gadolinium with MRCP is recommended in 2 years to ensure continued
stability of this finding. This recommendation follows ACR consensus
guidelines: Management of Incidental Pancreatic Cysts: A White Paper
of the ACR Incidental Findings Committee. [HOSPITAL]

## 2021-11-26 MED ORDER — GADOBENATE DIMEGLUMINE 529 MG/ML IV SOLN
18.0000 mL | Freq: Once | INTRAVENOUS | Status: AC | PRN
  Administered 2021-11-26: 18 mL via INTRAVENOUS

## 2021-11-26 NOTE — Patient Instructions (Signed)
Referral has been sent to Dr Caroleen Hamman, his office will call you to schedule appointment ? ? [address], [place], [place] [address] ?Phone: [phone] ?

## 2021-11-26 NOTE — Progress Notes (Signed)
? ? ?Primary Care Physician: Burnard Hawthorne, FNP ? ?Primary Gastroenterologist:  Dr. Lucilla Lame ? ?Chief Complaint  ?Patient presents with  ? Follow-up  ? ? ?HPI: Kathy Bryant is a 76 y.o. female here with a history of cirrhosis and abnormal liver enzymes.  The patient was also found to have a lesion in the pancreas.  This was found with an MRI.  The patient's liver enzymes have shown: ? ?Component ?    Latest Ref Rng 04/23/2019 12/11/2019 03/11/2020 07/14/2021  ?Alkaline Phosphatase ?    39 - 117 U/L 72  93  71  72   ?AST ?    0 - 37 U/L 28  68 (H)  28  26   ?ALT ?    0 - 35 U/L 44 (H)  62 (H)  37 (H)  39 (H)   ? ?The patient also had an MRI back in July that showed: ? ?IMPRESSION: ?1. Cirrhotic hepatic morphology. No arterially enhancing hepatic ?lesions. ?2. Hepatomegaly with moderate diffuse hepatic steatosis and fatty ?sparing along the falciform ligament. ?3. 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 Recommend follow up pre and ?post contrast MRI/MRCP or pancreatic protocol CT in 2 years. This ?recommendation follows ACR consensus guidelines: Management of ?Incidental Pancreatic Cysts: A White Paper of the ACR Incidental ?Findings Committee. [place] 0938;18:299-371. ?4. Prominence of the intra and extrahepatic biliary tree with ?tapering of the common duct distally, most consistent with reservoir ?effect post cholecystectomy. ?5.  Aortic Atherosclerosis (ICD10-I70.0). ? ?The patient had a repeat MRI today.  The patient has a history of adenomatous colon polyps and her last colonoscopy was in September 2020.  The patient MRI today was done for [facility] surveillance. ? ?Past Medical History:  ?Diagnosis Date  ? Anxiety   ? Bilateral calf pain   ? Diabetes mellitus without complication ([place])   ? PT STATES SHE WAS ON METFORMIN AND STOPPED TAKING THIS ON HER OWN  ? Fatty liver   ? Hepatitis 1960'S  ? B  ? Herniated disc, cervical   ? HTN  (hypertension)   ? PT WAS ON BP MEDS AND THEN STATES HER BP WAS UNDER CONTROL AND SHE STOPPED TAKING BP ON HER OWN  ? Microscopic hematuria   ? Obesity   ? Swelling of both lower extremities   ? ? ?Current Outpatient Medications  ?Medication Sig Dispense Refill  ? ALPRAZolam (XANAX) 0.25 MG tablet Take 1 tablet (0.25 mg total) by mouth 2 (two) times daily as needed. for anxiety 30 tablet 1  ? cyanocobalamin (,VITAMIN B-12,) 1000 MCG/ML injection 1000 mcg (1 mL) intramuscular injection in the thigh ( vastus lateralis)  once per month. 1 mL 15  ? erythromycin ophthalmic ointment Use one half inch four times daily to affected eye (s) x 7 days. 3.5 g 0  ? losartan (COZAAR) 25 MG tablet Take 1 tablet (25 mg total) by mouth daily. 90 tablet 1  ? metFORMIN (GLUCOPHAGE XR) 500 MG 24 hr tablet Take 2 tablets (1,000 mg total) by mouth every evening. 60 tablet 3  ? pravastatin (PRAVACHOL) 20 MG tablet Take 1 tablet (20 mg total) by mouth once a week. 12 tablet 1  ? ?No current facility-administered medications for this visit.  ? ? ?Allergies as of 11/26/2021 - Review Complete 11/26/2021  ?Allergen Reaction Noted  ? Simvastatin Other (See Comments) 10/25/2011  ? ? ?ROS: ? ?General: Negative  for anorexia, weight loss, fever, chills, fatigue, weakness. ?ENT: Negative for hoarseness, difficulty swallowing , nasal congestion. ?CV: Negative for chest pain, angina, palpitations, dyspnea on exertion, peripheral edema.  ?Respiratory: Negative for dyspnea at rest, dyspnea on exertion, cough, sputum, wheezing.  ?GI: See history of present illness. ?GU:  Negative for dysuria, hematuria, urinary incontinence, urinary frequency, nocturnal urination.  ?Endo: Negative for unusual weight change.  ?  ?Physical Examination: ? ? BP 102/61   Pulse 86   Temp 98.4 ?F (36.9 ?C) (Oral)   Wt 192 lb (87.1 kg)   BMI 35.12 kg/m?  ? ?General: Well-nourished, well-developed in no acute distress.  ?Eyes: No icterus. Conjunctivae pink. ?Lungs: Clear to  auscultation bilaterally. Non-labored. ?Heart: Regular rate and rhythm, no murmurs rubs or gallops.  ?Abdomen: Bowel sounds are normal, nontender, nondistended, no hepatosplenomegaly or masses, no abdominal bruits or hernia , no rebound or guarding.   ?Extremities: No lower extremity edema. No clubbing or deformities. ?Neuro: Alert and oriented x 3.  Grossly intact. ?Skin: Warm and dry, no jaundice.   ?Psych: Alert and cooperative, normal mood and affect. ? ?Labs:  ?  ?Imaging Studies: ?No results found. ? ?Assessment and Plan:  ? ?Kathy Bryant is a 76 y.o. y/o female who comes in today with a history of cirrhosis.  The patient had a MRI done today with a indication of [facility] surveillance.  The patient's MRI is not back yet.  The patient has no complaints at the present time.  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  The patient has been explained the plan and agrees with it. ? ? ? ? ?Lucilla Lame, MD. [name] ? ? ? Note: This dictation was prepared with Dragon dictation along with smaller phrase technology. Any transcriptional errors that result from this process are unintentional.  ?

## 2021-11-30 ENCOUNTER — Encounter: Payer: Self-pay | Admitting: Gastroenterology

## 2021-12-02 ENCOUNTER — Other Ambulatory Visit: Payer: Medicare PPO

## 2021-12-09 ENCOUNTER — Ambulatory Visit: Payer: Medicare PPO | Admitting: Surgery

## 2021-12-09 ENCOUNTER — Encounter: Payer: Self-pay | Admitting: Surgery

## 2021-12-09 VITALS — BP 107/70 | HR 71 | Temp 98.3°F | Ht 63.0 in | Wt 191.6 lb

## 2021-12-09 DIAGNOSIS — K449 Diaphragmatic hernia without obstruction or gangrene: Secondary | ICD-10-CM | POA: Diagnosis not present

## 2021-12-09 NOTE — Patient Instructions (Addendum)
We will get you scheduled for a CT scan and Barium swallow study to fully assess your anatomy.  ? ?You are scheduled for a Barium Swallow study at [facility] on Thursday May 18th. You will need to arrive at the [facility] at 9:30 am. You will need to have nothing to eat or drink after midnight the night prior.  ? ?You are scheduled for a CT scan at [facility] on Thursday June 1st. You will arrive at the [facility] entrance at 9:30 am. You will need to have noting to eat or drink for 4 hours prior. You will need to pick up a prep kit for this.  ? ?We will have you follow up here after we get the results of this testing.  ?

## 2021-12-09 NOTE — Progress Notes (Signed)
Patient ID: Kathy Bryant, female   DOB: 1945/08/18, 76 y.o.   MRN: [id number] ?  ?HPI ?Kathy Bryant is a 76 y.o. female seen in consultation at the request of Dr. Allen Norris for paraesophageal hernia.  He does have a history of cirrhosis that is [place] Eng a and she has been seen by Dr. Allen Norris.  Currently she endorses no reflux she is swallowing well.  Of note she did have a history of a very early and historical bariatric surgery by Dr. Kathleen Lime at [facility] consistent with Open HORIZONTAL GASTROPLASTY and ventral hernia repair .  Per the patient understands and her own drawing.  She underwent an upper endoscopy a couple of years ago that I have personally reviewed where there is evidence of a generous fundus and the anatomy is really not clear-cut for paraesophageal hernia.  I have recently personally reviewed the MRI showing sliding hiatal hernia but not as significant paraesophageal component.  She did have some cirrhosis.  And a small pancreatic cyst.  CMP was completely normal except mild hyperglycemia, CBC was normal.  She is able to perform more than 4 METS of activity without any shortness of breath or chest pain ?He also had a prior surgical history consistent with appendectomy, cholecystectomy , C-sections and ventral hernia repair ?HPI ?  ?    ?Past Medical History:  ?Diagnosis Date  ? Anxiety    ? Bilateral calf pain    ? Diabetes mellitus without complication ([place])    ?  PT STATES SHE WAS ON METFORMIN AND STOPPED TAKING THIS ON HER OWN  ? Fatty liver    ? Hepatitis 1960'S  ?  B  ? Herniated disc, cervical    ? HTN (hypertension)    ?  PT WAS ON BP MEDS AND THEN STATES HER BP WAS UNDER CONTROL AND SHE STOPPED TAKING BP ON HER OWN  ? Microscopic hematuria    ? Obesity    ? Swelling of both lower extremities    ?  ?  ?     ?Past Surgical History:  ?Procedure Laterality Date  ? APPENDECTOMY   1968  ? BREAST BIOPSY Right 2013  ?  x2 BENIGN BREAST TISSUE WITH FOCAL HYALINIZED STROMA AND BENIGN BREAST TISSUE  WITH FOCAL FIBROADENOMATOUS CHANGE   ? [place]  ? CHOLECYSTECTOMY      ? COLONOSCOPY   03/05/2014  ? COLONOSCOPY WITH PROPOFOL N/A 04/27/2019  ?  Procedure: COLONOSCOPY WITH PROPOFOL;  Surgeon: Lucilla Lame, MD;  Location: [facility];  Service: Endoscopy;  Laterality: N/A;  ? ESOPHAGOGASTRODUODENOSCOPY (EGD) WITH PROPOFOL N/A 04/27/2019  ?  Procedure: ESOPHAGOGASTRODUODENOSCOPY (EGD) WITH PROPOFOL;  Surgeon: Lucilla Lame, MD;  Location: [facility];  Service: Endoscopy;  Laterality: N/A;  ? [place]  ? GASTRIC RESTRICTION SURGERY   1980  ?  'stomach stapled' with banding  ? HEMORRHOID SURGERY N/A 12/23/2016  ?  Procedure: HEMORRHOIDECTOMY;  Surgeon: Christene Lye, MD;  Location: [facility];  Service: General;  Laterality: N/A;  ? HERNIA REPAIR      ?  Abdominal-ventral  ? POLYPECTOMY N/A 04/27/2019  ?  Procedure: POLYPECTOMY;  Surgeon: Lucilla Lame, MD;  Location: [facility];  Service: Endoscopy;  Laterality: N/A;  ? TOTAL ABDOMINAL HYSTERECTOMY W/ BILATERAL SALPINGOOPHORECTOMY   1998  ?  ?  ?     ?Family History  ?Problem Relation Age of Onset  ? Diabetes Mother    ?  Cancer Mother 64  ?      Brain Tumor - died age 65  ? Heart disease Father    ?      CAD - died MI - age 36  ? Hypertension Father    ? High Cholesterol Father    ? Diabetes Sister    ? Diabetes Brother    ? Diabetes Brother    ? Cancer Paternal Aunt    ?      Renal Cell Ca  ? Cancer Paternal Uncle    ?      Renal Cell Ca  ? Breast cancer Neg Hx    ?  ?  ?Social History ?Social History  ?  ?     ?Tobacco Use  ? Smoking status: Every Day  ?    Types: Cigarettes  ?    Start date: 05/24/2019  ?    Passive exposure: Never  ? Smokeless tobacco: Never  ? Tobacco comments:  ?    "I WAS A CLOSET SMOKER AND NEVER SMOKED ALOT"  ?Vaping Use  ? Vaping Use: Never used  ?Substance Use Topics  ? Alcohol use: Yes  ?    Comment: Occasional glass of wine  ? Drug use: No  ?  ?  ?     ?Allergies   ?Allergen Reactions  ? Simvastatin Other (See Comments)  ?    Nightmares  ?  ?  ?      ?Current Outpatient Medications  ?Medication Sig Dispense Refill  ? ALPRAZolam (XANAX) 0.25 MG tablet Take 1 tablet (0.25 mg total) by mouth 2 (two) times daily as needed. for anxiety 30 tablet 1  ? cyanocobalamin (,VITAMIN B-12,) 1000 MCG/ML injection 1000 mcg (1 mL) intramuscular injection in the thigh ( vastus lateralis)  once per month. 1 mL 15  ? erythromycin ophthalmic ointment Use one half inch four times daily to affected eye (s) x 7 days. 3.5 g 0  ? metFORMIN (GLUCOPHAGE XR) 500 MG 24 hr tablet Take 2 tablets (1,000 mg total) by mouth every evening. 60 tablet 3  ? pravastatin (PRAVACHOL) 20 MG tablet Take 1 tablet (20 mg total) by mouth once a week. 12 tablet 1  ? losartan (COZAAR) 25 MG tablet Take 1 tablet (25 mg total) by mouth daily. (Patient not taking: Reported on 12/09/2021) 90 tablet 1  ?  ?No current facility-administered medications for this visit.  ?  ?  ?  ?Review of Systems ?Full ROS  was asked and was negative except for the information on the HPI ?  ?Physical Exam ?Blood pressure 107/70, pulse 71, temperature 98.3 ?F (36.8 ?C), height '5\' 3"'$  (1.6 m), weight 191 lb 9.6 oz (86.9 kg), SpO2 96 %. ?CONSTITUTIONAL: NAD. ?EYES: Pupils are equal, round, a, Sclera are non-icteric. ?EARS, NOSE, MOUTH AND THROAT:  The oral mucosa is pink and moist. Hearing is intact to voice. ?LYMPH NODES:  Lymph nodes in the neck are normal. ?RESPIRATORY:  Lungs are clear. There is normal respiratory effort, with equal breath sounds bilaterally, and without pathologic use of accessory muscles. ?CARDIOVASCULAR: Heart is regular without murmurs, gallops, or rubs. ?GI: The abdomen is  soft, nontender, and nondistended.  Large midline laparotomy scar without any hernias there are no palpable masses. There is no hepatosplenomegaly. There are normal bowel sounds in all quadrants. ?GU: Rectal deferred.   ?MUSCULOSKELETAL: Normal muscle  strength and tone. No cyanosis or edema.   ?SKIN: Turgor is good and there are no pathologic skin  lesions or ulcers. ?NEUROLOGIC: Motor and sensation is grossly normal. Cranial nerves are grossly intact. ?PSYCH:  Oriented to person, place and time. Affect is normal. ?  ?Data Reviewed ?  ?I have personally reviewed the patient's imaging, laboratory findings and medical records.   ?  ?Assessment/Plan ?76 year old female with prior historic Bariatric operation back in the 1980s consistent with horizontal gastroplasty.  I am not sure if she truly has a paraesophageal component and I would like to interrogate this border with a CT scan of the abdomen pelvis as well has a barium swallow.  Currently no need for any emergent surgical intervention at this time.  I would like to delineate her mediastinal and intra-abdominal anatomy first ?Please note that I spent 60 minutes in this encounter including personally reviewing imaging studies, medical records, counseling the patient, placing orders and performing appropriate accommodations ?  ?  ?Caroleen Hamman, MD FACS ?General Surgeon ?12/09/2021, 3:10 PM ?  ?   ?  ?  ?  ? ?

## 2021-12-09 NOTE — Consult Note (Signed)
Patient ID: Kathy Bryant, female   DOB: 1946-02-07, 76 y.o.   MRN: [id number] ? ?HPI ?Kathy Bryant is a 76 y.o. female seen in consultation at the request of Dr. Allen Norris for paraesophageal hernia.  He does have a history of cirrhosis that is [place] Eng a and she has been seen by Dr. Allen Norris.  Currently she endorses no reflux she is swallowing well.  Of note she did have a history of a very early and historical bariatric surgery by Dr. Kathleen Lime at [facility] consistent with Open HORIZONTAL GASTROPLASTY and ventral hernia repair .  Per the patient understands and her own drawing.  She underwent an upper endoscopy a couple of years ago that I have personally reviewed where there is evidence of a generous fundus and the anatomy is really not clear-cut for paraesophageal hernia.  I have recently personally reviewed the MRI showing sliding hiatal hernia but not as significant paraesophageal component.  She did have some cirrhosis.  And a small pancreatic cyst.  CMP was completely normal except mild hyperglycemia, CBC was normal.  She is able to perform more than 4 METS of activity without any shortness of breath or chest pain ?He also had a prior surgical history consistent with appendectomy, cholecystectomy , C-sections and ventral hernia repair ?HPI ? ?Past Medical History:  ?Diagnosis Date  ? Anxiety   ? Bilateral calf pain   ? Diabetes mellitus without complication ([place])   ? PT STATES SHE WAS ON METFORMIN AND STOPPED TAKING THIS ON HER OWN  ? Fatty liver   ? Hepatitis 1960'S  ? B  ? Herniated disc, cervical   ? HTN (hypertension)   ? PT WAS ON BP MEDS AND THEN STATES HER BP WAS UNDER CONTROL AND SHE STOPPED TAKING BP ON HER OWN  ? Microscopic hematuria   ? Obesity   ? Swelling of both lower extremities   ? ? ?Past Surgical History:  ?Procedure Laterality Date  ? APPENDECTOMY  1968  ? BREAST BIOPSY Right 2013  ? x2 BENIGN BREAST TISSUE WITH FOCAL HYALINIZED STROMA AND BENIGN BREAST TISSUE WITH FOCAL FIBROADENOMATOUS  CHANGE   ? [place]  ? CHOLECYSTECTOMY    ? COLONOSCOPY  03/05/2014  ? COLONOSCOPY WITH PROPOFOL N/A 04/27/2019  ? Procedure: COLONOSCOPY WITH PROPOFOL;  Surgeon: Lucilla Lame, MD;  Location: [place];  Service: Endoscopy;  Laterality: N/A;  ? ESOPHAGOGASTRODUODENOSCOPY (EGD) WITH PROPOFOL N/A 04/27/2019  ? Procedure: ESOPHAGOGASTRODUODENOSCOPY (EGD) WITH PROPOFOL;  Surgeon: Lucilla Lame, MD;  Location: [place];  Service: Endoscopy;  Laterality: N/A;  ? [place]  ? GASTRIC RESTRICTION SURGERY  1980  ? 'stomach stapled' with banding  ? HEMORRHOID SURGERY N/A 12/23/2016  ? Procedure: HEMORRHOIDECTOMY;  Surgeon: Christene Lye, MD;  Location: [facility];  Service: General;  Laterality: N/A;  ? HERNIA REPAIR    ? Abdominal-ventral  ? POLYPECTOMY N/A 04/27/2019  ? Procedure: POLYPECTOMY;  Surgeon: Lucilla Lame, MD;  Location: [place];  Service: Endoscopy;  Laterality: N/A;  ? TOTAL ABDOMINAL HYSTERECTOMY W/ BILATERAL SALPINGOOPHORECTOMY  1998  ? ? ?Family History  ?Problem Relation Age of Onset  ? Diabetes Mother   ? Cancer Mother 89  ?     Brain Tumor - died age 42  ? Heart disease Father   ?     CAD - died MI - age 64  ? Hypertension Father   ? High Cholesterol Father   ? Diabetes  Sister   ? Diabetes Brother   ? Diabetes Brother   ? Cancer Paternal Aunt   ?     Renal Cell Ca  ? Cancer Paternal Uncle   ?     Renal Cell Ca  ? Breast cancer Neg Hx   ? ? ?Social History ?Social History  ? ?Tobacco Use  ? Smoking status: Every Day  ?  Types: Cigarettes  ?  Start date: 05/24/2019  ?  Passive exposure: Never  ? Smokeless tobacco: Never  ? Tobacco comments:  ?  "I WAS A CLOSET SMOKER AND NEVER SMOKED ALOT"  ?Vaping Use  ? Vaping Use: Never used  ?Substance Use Topics  ? Alcohol use: Yes  ?  Comment: Occasional glass of wine  ? Drug use: No  ? ? ?Allergies  ?Allergen Reactions  ? Simvastatin Other (See Comments)  ?  Nightmares  ? ? ?Current  Outpatient Medications  ?Medication Sig Dispense Refill  ? ALPRAZolam (XANAX) 0.25 MG tablet Take 1 tablet (0.25 mg total) by mouth 2 (two) times daily as needed. for anxiety 30 tablet 1  ? cyanocobalamin (,VITAMIN B-12,) 1000 MCG/ML injection 1000 mcg (1 mL) intramuscular injection in the thigh ( vastus lateralis)  once per month. 1 mL 15  ? erythromycin ophthalmic ointment Use one half inch four times daily to affected eye (s) x 7 days. 3.5 g 0  ? metFORMIN (GLUCOPHAGE XR) 500 MG 24 hr tablet Take 2 tablets (1,000 mg total) by mouth every evening. 60 tablet 3  ? pravastatin (PRAVACHOL) 20 MG tablet Take 1 tablet (20 mg total) by mouth once a week. 12 tablet 1  ? losartan (COZAAR) 25 MG tablet Take 1 tablet (25 mg total) by mouth daily. (Patient not taking: Reported on 12/09/2021) 90 tablet 1  ? ?No current facility-administered medications for this visit.  ? ? ? ?Review of Systems ?Full ROS  was asked and was negative except for the information on the HPI ? ?Physical Exam ?Blood pressure 107/70, pulse 71, temperature 98.3 ?F (36.8 ?C), height '5\' 3"'$  (1.6 m), weight 191 lb 9.6 oz (86.9 kg), SpO2 96 %. ?CONSTITUTIONAL: NAD. ?EYES: Pupils are equal, round, a, Sclera are non-icteric. ?EARS, NOSE, MOUTH AND THROAT:  The oral mucosa is pink and moist. Hearing is intact to voice. ?LYMPH NODES:  Lymph nodes in the neck are normal. ?RESPIRATORY:  Lungs are clear. There is normal respiratory effort, with equal breath sounds bilaterally, and without pathologic use of accessory muscles. ?CARDIOVASCULAR: Heart is regular without murmurs, gallops, or rubs. ?GI: The abdomen is  soft, nontender, and nondistended.  Large midline laparotomy scar without any hernias there are no palpable masses. There is no hepatosplenomegaly. There are normal bowel sounds in all quadrants. ?GU: Rectal deferred.   ?MUSCULOSKELETAL: Normal muscle strength and tone. No cyanosis or edema.   ?SKIN: Turgor is good and there are no pathologic skin lesions or  ulcers. ?NEUROLOGIC: Motor and sensation is grossly normal. Cranial nerves are grossly intact. ?PSYCH:  Oriented to person, place and time. Affect is normal. ? ?Data Reviewed ? ?I have personally reviewed the patient's imaging, laboratory findings and medical records.   ? ?Assessment/Plan ?76 year old female with prior historic Bariatric operation back in the 1980s consistent with horizontal gastroplasty.  I am not sure if she truly has a paraesophageal component and I would like to interrogate this border with a CT scan of the abdomen pelvis as well has a barium swallow.  Currently no need for any  emergent surgical intervention at this time.  I would like to delineate her mediastinal and intra-abdominal anatomy first ?Please note that I spent 60 minutes in this encounter including personally reviewing imaging studies, medical records, counseling the patient, placing orders and performing appropriate accommodations ? ? ?Caroleen Hamman, MD FACS ?General Surgeon ?12/09/2021, 3:10 PM ? ?  ?

## 2021-12-11 ENCOUNTER — Telehealth: Payer: Self-pay

## 2021-12-11 NOTE — Telephone Encounter (Signed)
0.25 Alprazolam is on back order. [facility] is requesting it be changed to 0.50 mg with 1/2 tab BID instructions.  ?

## 2021-12-14 NOTE — Telephone Encounter (Signed)
Call pt ?She pick up alprazolam 11/17/21 ? ?In the past she used alprazolam very rarely and not daily.  Has she requested a refill?  Please confirm exactly how often and how much she is taking of this medication ? ?I looked up patient on [place] Controlled Substances Reporting System PMP AWARE and saw no activity that raised concern of inappropriate use.  ? ? ?

## 2021-12-15 ENCOUNTER — Other Ambulatory Visit: Payer: Self-pay

## 2021-12-15 DIAGNOSIS — E538 Deficiency of other specified B group vitamins: Secondary | ICD-10-CM

## 2021-12-15 MED ORDER — CYANOCOBALAMIN 1000 MCG/ML IJ SOLN: INTRAMUSCULAR | 15 refills | Status: DC

## 2021-12-15 NOTE — Telephone Encounter (Signed)
Spoke to patient and she stated that she only takes the Alprazolam as needed and she does not need a refill at this time. Patient stated that she has appointment with you on 31st and she may need refill then? Pharmacy was just out of her usual dose and wanted to send her the .50 instead of the .25, but she does not need it as of yet?? ?

## 2021-12-15 NOTE — Telephone Encounter (Signed)
Advise pt to bring to my attention need for refill alprazolam at our follow up ?Will not refill until then as she has medication ?

## 2021-12-16 NOTE — Telephone Encounter (Signed)
Spoke to patient to inform her to remind you of refill at her appointment ?

## 2021-12-21 ENCOUNTER — Telehealth (INDEPENDENT_AMBULATORY_CARE_PROVIDER_SITE_OTHER): Payer: Medicare PPO | Admitting: Family Medicine

## 2021-12-21 ENCOUNTER — Encounter: Payer: Self-pay | Admitting: Family Medicine

## 2021-12-21 VITALS — Ht 63.0 in

## 2021-12-21 DIAGNOSIS — R059 Cough, unspecified: Secondary | ICD-10-CM | POA: Diagnosis not present

## 2021-12-21 DIAGNOSIS — J069 Acute upper respiratory infection, unspecified: Secondary | ICD-10-CM

## 2021-12-21 MED ORDER — BENZONATATE 100 MG PO CAPS: 100.0000 mg | ORAL_CAPSULE | Freq: Two times a day (BID) | ORAL | 0 refills | Status: AC | PRN

## 2021-12-21 MED ORDER — FLUTICASONE PROPIONATE 50 MCG/ACT NA SUSP: 1.0000 | Freq: Two times a day (BID) | NASAL | 0 refills | Status: DC

## 2021-12-21 MED ORDER — DOXYCYCLINE HYCLATE 100 MG PO TABS: 100.0000 mg | ORAL_TABLET | Freq: Two times a day (BID) | ORAL | 0 refills | Status: AC

## 2021-12-21 NOTE — Progress Notes (Signed)
Virtual Visit via Video Note ?I connected with Kathy Bryant on 12/21/21 by a video enabled telemedicine application and verified that I am speaking with the correct person using two identifiers. ? Location patient: home ?Location provider:work office ?Persons participating in the virtual visit: patient, provider ? ?I discussed the limitations of evaluation and management by telemedicine and the availability of in person appointments. The patient expressed understanding and agreed to proceed. ? ?Chief Complaint  ?Patient presents with  ? Cough  ?  X 3 days, productive, a little phlegm.   ? Fever  ?  No thermometer, felt feverish.   ? Ear Pain  ? ?HPI: ?Kathy Bryant is a 76 year old female with history of DM 2, anxiety, hypertension, and fatty liver complaining of respiratory symptoms as described above. ?3 days of cough, initially dry, today it has been productive. ?Subjective fever, fatigue,decreased appetite,frontal pressure headache, "scratchy" throat, and diarrhea when symptoms first started (Friday and Saturday). ?She has not noted anosmia or ageusia. ?She has not checked temperature. ? ?Cough ?This is a new problem. The current episode started in the past 7 days. The problem has been unchanged. The problem occurs every few minutes. The cough is Productive of sputum. Associated symptoms include ear congestion, nasal congestion, postnasal drip and rhinorrhea. Pertinent negatives include no hemoptysis or shortness of breath. Nothing aggravates the symptoms. Risk factors for lung disease include smoking/tobacco exposure.  ?Negative for abdominal pain,N/V,urinary symptoms,or skin rash. ? ?She has not had a COVID-19 test done. ?No sick contact. ?Vaccination up-to-date. ? ?ROS: See pertinent positives and negatives per HPI. ? ?Past Medical History:  ?Diagnosis Date  ? Anxiety   ? Bilateral calf pain   ? Diabetes mellitus without complication ([place])   ? PT STATES SHE WAS ON METFORMIN AND STOPPED TAKING THIS ON HER  OWN  ? Fatty liver   ? Hepatitis 1960'S  ? B  ? Herniated disc, cervical   ? HTN (hypertension)   ? PT WAS ON BP MEDS AND THEN STATES HER BP WAS UNDER CONTROL AND SHE STOPPED TAKING BP ON HER OWN  ? Microscopic hematuria   ? Obesity   ? Swelling of both lower extremities   ? ?Past Surgical History:  ?Procedure Laterality Date  ? APPENDECTOMY  1968  ? BREAST BIOPSY Right 2013  ? x2 BENIGN BREAST TISSUE WITH FOCAL HYALINIZED STROMA AND BENIGN BREAST TISSUE WITH FOCAL FIBROADENOMATOUS CHANGE   ? [place]  ? CHOLECYSTECTOMY    ? COLONOSCOPY  03/05/2014  ? COLONOSCOPY WITH PROPOFOL N/A 04/27/2019  ? Procedure: COLONOSCOPY WITH PROPOFOL;  Surgeon: Lucilla Lame, MD;  Location: [place];  Service: Endoscopy;  Laterality: N/A;  ? ESOPHAGOGASTRODUODENOSCOPY (EGD) WITH PROPOFOL N/A 04/27/2019  ? Procedure: ESOPHAGOGASTRODUODENOSCOPY (EGD) WITH PROPOFOL;  Surgeon: Lucilla Lame, MD;  Location: [place];  Service: Endoscopy;  Laterality: N/A;  ? [place]  ? GASTRIC RESTRICTION SURGERY  1980  ? 'stomach stapled' with banding  ? HEMORRHOID SURGERY N/A 12/23/2016  ? Procedure: HEMORRHOIDECTOMY;  Surgeon: Christene Lye, MD;  Location: [facility];  Service: General;  Laterality: N/A;  ? HERNIA REPAIR    ? Abdominal-ventral  ? POLYPECTOMY N/A 04/27/2019  ? Procedure: POLYPECTOMY;  Surgeon: Lucilla Lame, MD;  Location: [place];  Service: Endoscopy;  Laterality: N/A;  ? TOTAL ABDOMINAL HYSTERECTOMY W/ BILATERAL SALPINGOOPHORECTOMY  1998  ? ? ?Family History  ?Problem Relation Age of Onset  ? Diabetes Mother   ? Cancer  Mother 28  ?     Brain Tumor - died age 51  ? Heart disease Father   ?     CAD - died MI - age 74  ? Hypertension Father   ? High Cholesterol Father   ? Diabetes Sister   ? Diabetes Brother   ? Diabetes Brother   ? Cancer Paternal Aunt   ?     Renal Cell Ca  ? Cancer Paternal Uncle   ?     Renal Cell Ca  ? Breast cancer Neg Hx   ? ?Social History   ? ?Socioeconomic History  ? Marital status: Married  ?  Spouse name: Not on file  ? Number of children: 2  ? Years of education: 29  ? Highest education level: Not on file  ?Occupational History  ? Occupation: Hotel manager: [employer]   ?Tobacco Use  ? Smoking status: Every Day  ?  Types: Cigarettes  ?  Start date: 05/24/2019  ?  Passive exposure: Never  ? Smokeless tobacco: Never  ? Tobacco comments:  ?  "I WAS A CLOSET SMOKER AND NEVER SMOKED ALOT"  ?Vaping Use  ? Vaping Use: Never used  ?Substance and Sexual Activity  ? Alcohol use: Yes  ?  Comment: Occasional glass of wine  ? Drug use: No  ? Sexual activity: Not on file  ?Other Topics Concern  ? Not on file  ?Social History Narrative  ? Not on file  ? ?Social Determinants of Health  ? ?Financial Resource Strain: Low Risk   ? Difficulty of Paying Living Expenses: Not hard at all  ?Food Insecurity: No Food Insecurity  ? Worried About Charity fundraiser in the Last Year: Never true  ? Ran Out of Food in the Last Year: Never true  ?Transportation Needs: No Transportation Needs  ? Lack of Transportation (Medical): No  ? Lack of Transportation (Non-Medical): No  ?Physical Activity: Not on file  ?Stress: No Stress Concern Present  ? Feeling of Stress : Only a little  ?Social Connections: Unknown  ? Frequency of Communication with Friends and Family: More than three times a week  ? Frequency of Social Gatherings with Friends and Family: More than three times a week  ? Attends Religious Services: Not on file  ? Active Member of Clubs or Organizations: Not on file  ? Attends Archivist Meetings: Not on file  ? Marital Status: Not on file  ?Intimate Partner Violence: Not At Risk  ? Fear of Current or Ex-Partner: No  ? Emotionally Abused: No  ? Physically Abused: No  ? Sexually Abused: No  ? ?Current Outpatient Medications:  ?  ALPRAZolam (XANAX) 0.25 MG tablet, Take 1 tablet (0.25 mg total) by mouth 2 (two) times daily as needed. for  anxiety, Disp: 30 tablet, Rfl: 1 ?  cyanocobalamin (,VITAMIN B-12,) 1000 MCG/ML injection, 1000 mcg (1 mL) intramuscular injection in the thigh ( vastus lateralis)  once per month., Disp: 1 mL, Rfl: 15 ?  erythromycin ophthalmic ointment, Use one half inch four times daily to affected eye (s) x 7 days., Disp: 3.5 g, Rfl: 0 ?  losartan (COZAAR) 25 MG tablet, Take 1 tablet (25 mg total) by mouth daily., Disp: 90 tablet, Rfl: 1 ?  metFORMIN (GLUCOPHAGE XR) 500 MG 24 hr tablet, Take 2 tablets (1,000 mg total) by mouth every evening., Disp: 60 tablet, Rfl: 3 ?  pravastatin (PRAVACHOL) 20 MG tablet, Take 1 tablet (  20 mg total) by mouth once a week., Disp: 12 tablet, Rfl: 1 ? ?EXAM: ? ?VITALS per patient if applicable:Ht '5\' 3"'$  (1.6 m)   BMI 33.94 kg/m?  ? ?GENERAL: alert, oriented, appears well and in no acute distress ? ?HEENT: atraumatic, conjunctiva clear, no obvious abnormalities on inspection of external nose and ears ? ?NECK: normal movements of the head and neck ? ?LUNGS: on inspection no signs of respiratory distress, breathing rate appears normal, no obvious gross SOB, gasping or wheezing ? ?CV: no obvious cyanosis ? ?MS: moves all visible extremities without noticeable abnormality ? ?PSYCH/NEURO: pleasant and cooperative, no obvious depression or anxiety, speech and thought processing grossly intact ? ?ASSESSMENT AND PLAN: ? ?Discussed the following assessment and plan: ? ?URI, acute ?Symptoms suggests a viral etiology, I explained patient that symptomatic treatment is usually recommended in this case, so I do not think abx is needed at this time. ?Instructed to monitor for signs of complications, including new onset of fever among some, clearly instructed about warning signs. ? ?She has history of liver disease, so she can take Tylenol 500 mg up to 3 to 4 tablets daily. ?Nasal saline irrigations as needed for nasal congestion as well as Flonase nasal spray once daily for a few days then as needed. ?Adequate  hydration. ?Strongly recommend having a COVID-19 test done today and to let me know the results. ? ?F/U as needed. ? ?Cough, unspecified type - Plan: benzonatate (TESSALON) 100 MG capsule ?Explained that coug

## 2021-12-22 ENCOUNTER — Other Ambulatory Visit (INDEPENDENT_AMBULATORY_CARE_PROVIDER_SITE_OTHER): Payer: Medicare PPO

## 2021-12-22 DIAGNOSIS — R059 Cough, unspecified: Secondary | ICD-10-CM

## 2021-12-22 DIAGNOSIS — E1165 Type 2 diabetes mellitus with hyperglycemia: Secondary | ICD-10-CM

## 2021-12-22 DIAGNOSIS — J069 Acute upper respiratory infection, unspecified: Secondary | ICD-10-CM | POA: Diagnosis not present

## 2021-12-22 LAB — LIPID PANEL
Cholesterol: 184 mg/dL (ref 0–200)
HDL: 35 mg/dL — ABNORMAL LOW (ref >39.00)
LDL Cholesterol: 114 mg/dL — ABNORMAL HIGH (ref 0–99)
NonHDL: 149.27
Total CHOL/HDL Ratio: 5
Triglycerides: 177 mg/dL — ABNORMAL HIGH (ref 0.0–149.0)
VLDL: 35.4 mg/dL (ref 0.0–40.0)

## 2021-12-22 NOTE — Addendum Note (Signed)
Addended by: Leeanne Rio on: 12/22/2021 12:01 PM ? ? Modules accepted: Orders ? ?

## 2021-12-23 LAB — NOVEL CORONAVIRUS, NAA: SARS-CoV-2, NAA: NOT DETECTED

## 2021-12-24 ENCOUNTER — Telehealth: Payer: Self-pay

## 2021-12-24 ENCOUNTER — Ambulatory Visit
Admission: RE | Admit: 2021-12-24 | Discharge: 2021-12-24 | Disposition: A | Discharge status: Home / Self Care | Payer: Medicare PPO | Source: Ambulatory Visit | Attending: Surgery | Admitting: Surgery

## 2021-12-24 DIAGNOSIS — K449 Diaphragmatic hernia without obstruction or gangrene: Secondary | ICD-10-CM | POA: Diagnosis not present

## 2021-12-24 DIAGNOSIS — Z0389 Encounter for observation for other suspected diseases and conditions ruled out: Secondary | ICD-10-CM | POA: Diagnosis not present

## 2021-12-24 IMAGING — RF DG ESOPHAGUS
9 of 12 series · 14 of 24 positions shown · non-contrast
Comparison: None Available.

CLINICAL DATA: Hiatal hernia

EXAM:
ESOPHAGUS/BARIUM SWALLOW/TABLET STUDY
TECHNIQUE: Combined double and single contrast examination was performed using
effervescent crystals, high-density barium, and thin liquid barium.
This exam was performed by JUMPER NP, and was supervised
and interpreted by JUMPER, M.D.
FLUOROSCOPY:
Radiation Exposure Index (as provided by the fluoroscopic device):
34.7 mGy

[Series 1: cp_standard · 0.26mm/px · 2 of 61 frames shown (1 of 9)]
[frame 10/61]
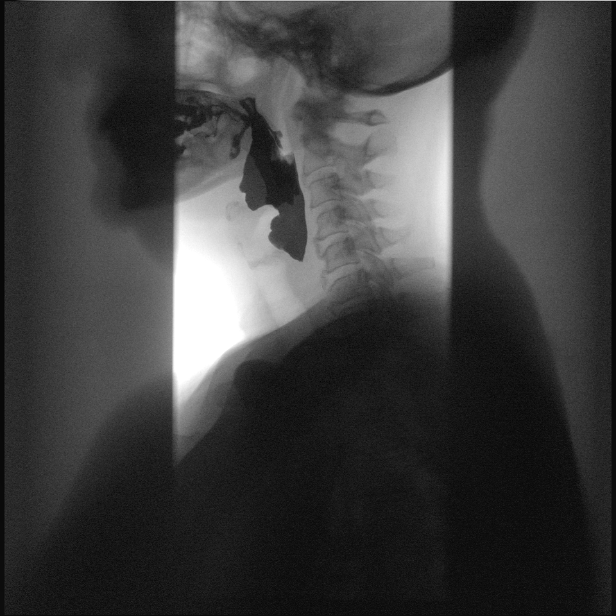
[frame 52/61]
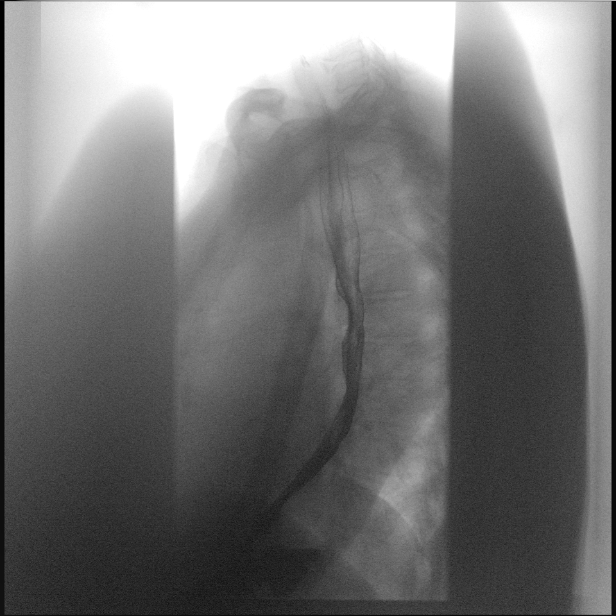

[Series 2: cp_standard · 0.26mm/px · 1 of 75 frames shown (2 of 9)]
[frame 64/75]
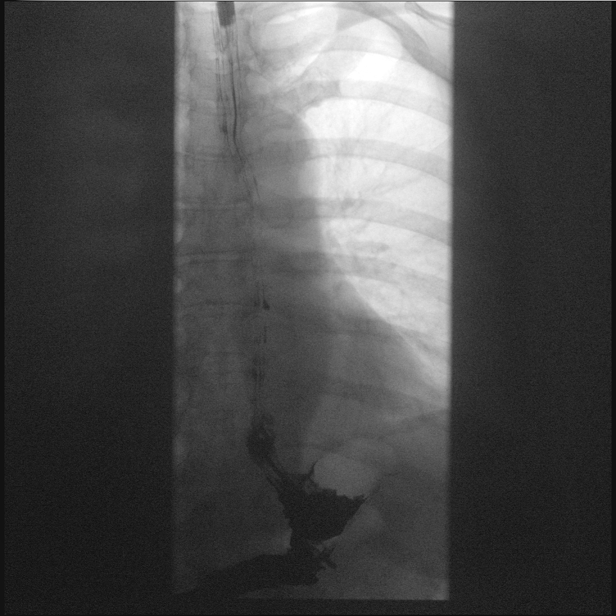

[Series 3: cp_standard · 0.26mm/px · 3 of 88 frames shown (3 of 9)]
[frame 14/88]
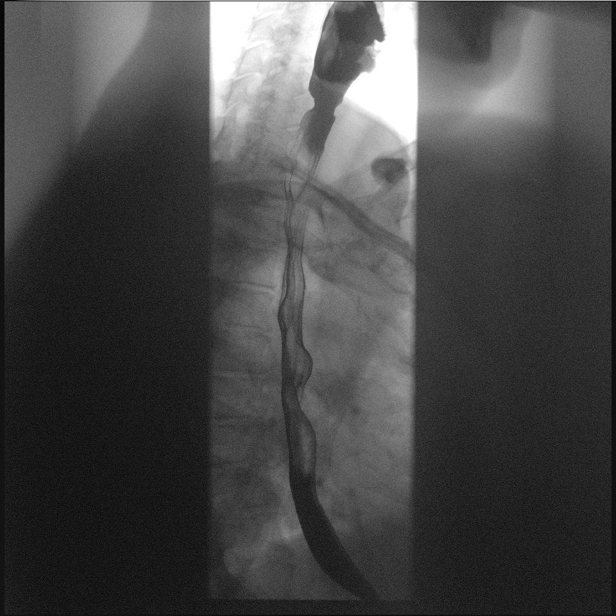
[frame 45/88]
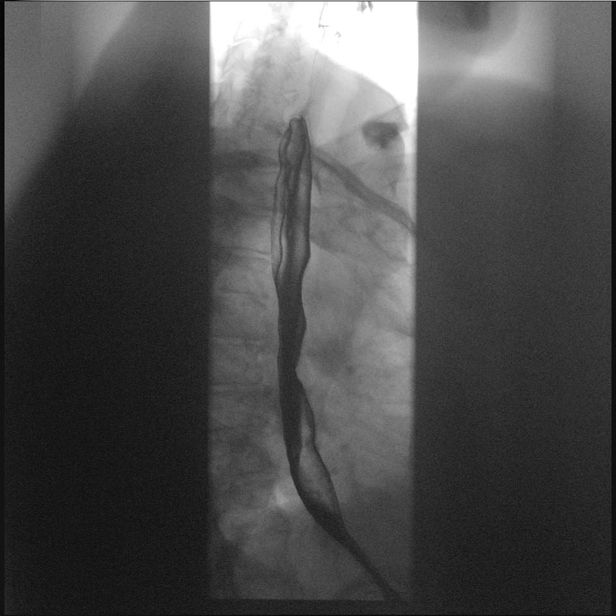
[frame 79/88]
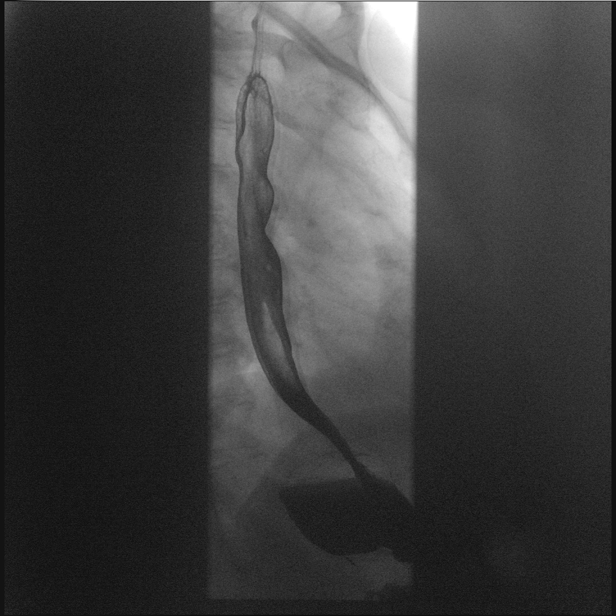

[Series 5: cp_standard · 0.26mm/px · 1 of 1 slices shown (4 of 9)]
[im 1/1]
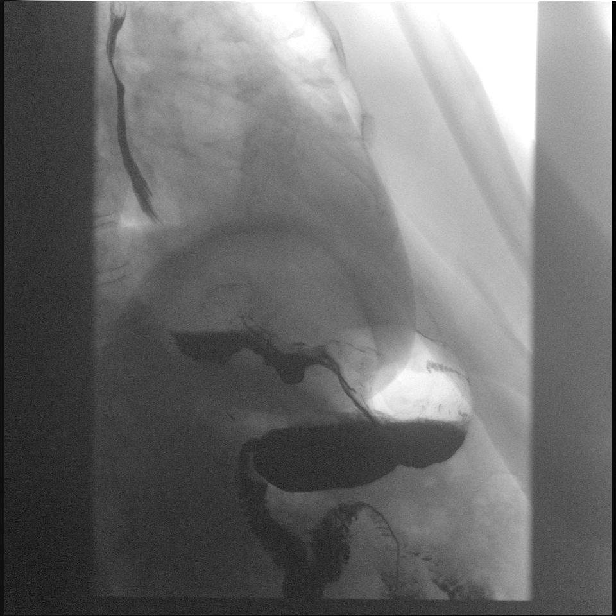

[Series 6: cp_standard · 0.26mm/px · 2 of 70 frames shown (5 of 9)]
[frame 36/70]
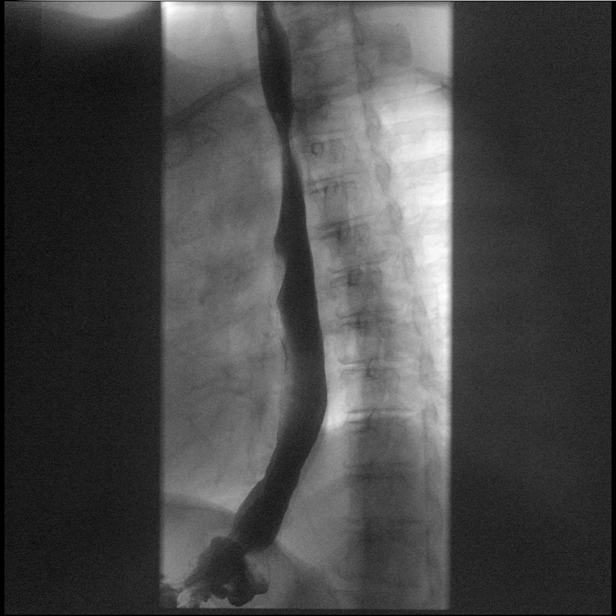
[frame 60/70]
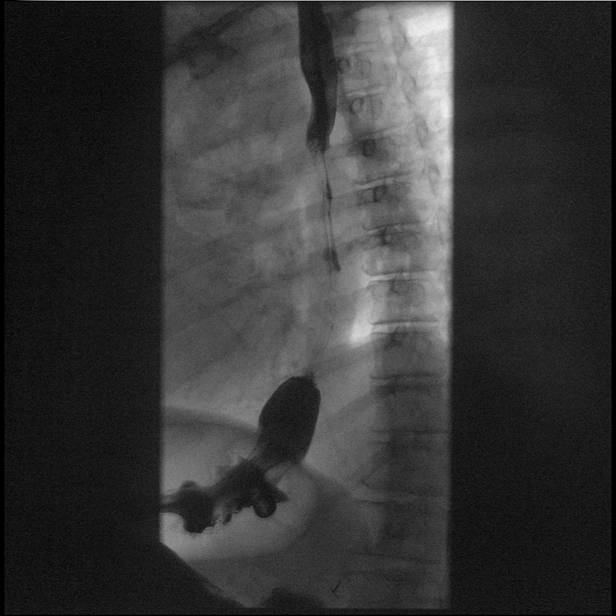

[Series 7: cp_standard · 0.27mm/px · 2 of 87 frames shown (6 of 9)]
[frame 44/87]
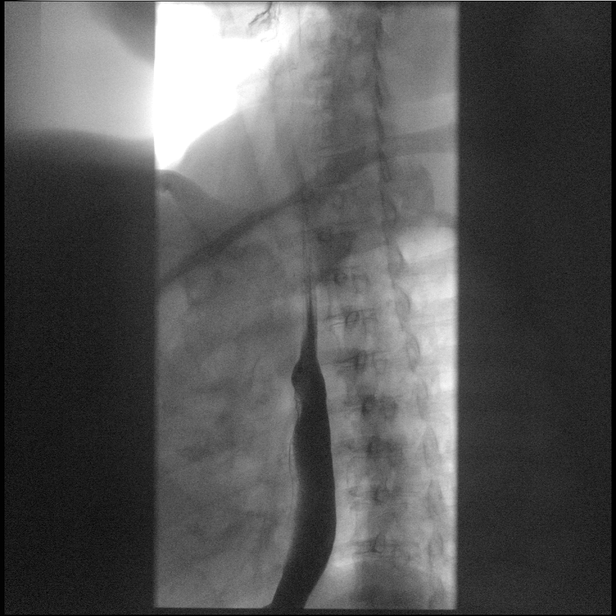
[frame 78/87]
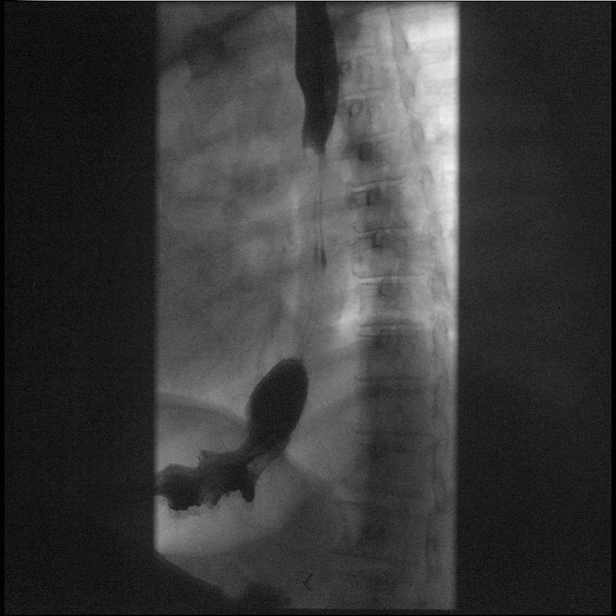

[Series 8: cp_standard · 0.27mm/px · 1 of 1 slices shown (7 of 9)]
[im 1/1]
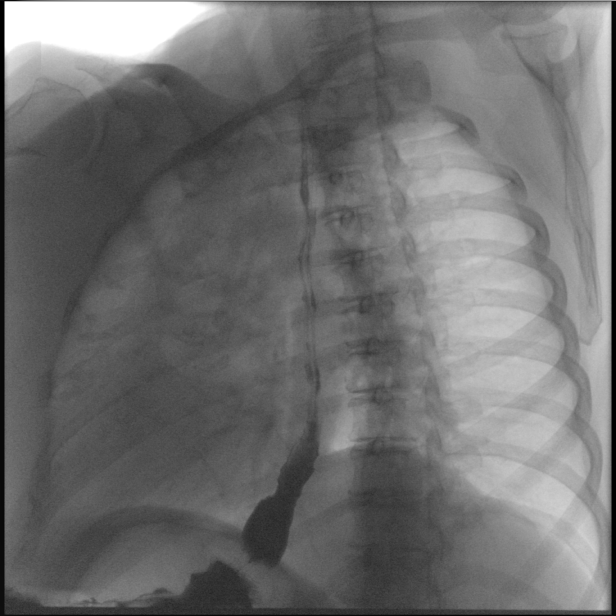

[Series 11: cp_standard · 0.27mm/px · 1 of 1 slices shown (8 of 9)]
[im 1/1]
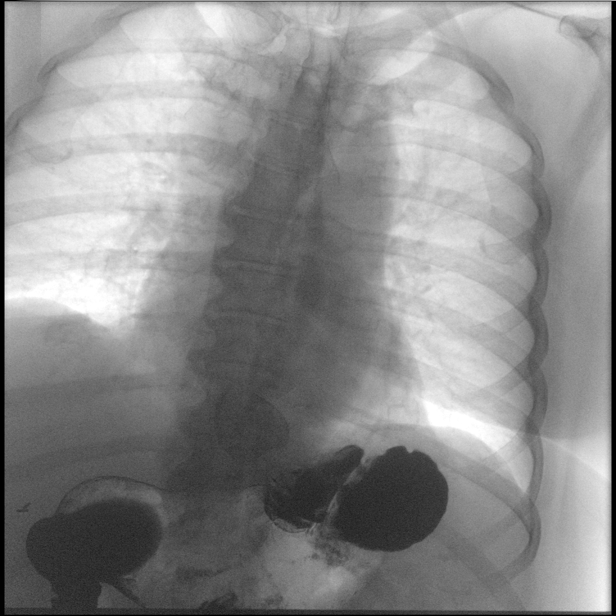

[Series 13: cp_standard · 0.26mm/px · 1 of 1 slices shown (9 of 9)]
[im 1/1]
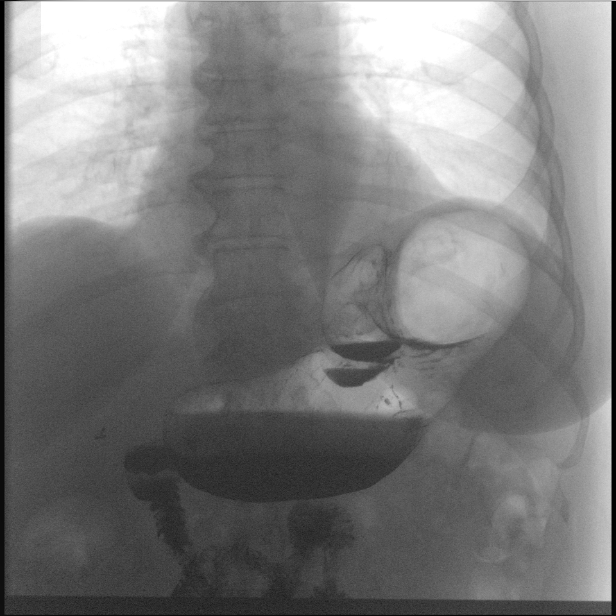

[14 of 24 positions shown; findings below may reference images not displayed]

FINDINGS: Normal pharyngeal anatomy and motility. Contrast flowed freely
through the esophagus without evidence of a stricture or mass.
Normal esophageal mucosa without evidence of irregularity or
ulceration. Tertiary contractions of the esophagus as can be seen
with presbyesophagus. No evidence of reflux. No definite hiatal
hernia was demonstrated.

At the end of the examination a 13 mm barium tablet was administered
which transited through the esophagus and esophagogastric junction
without delay.
IMPRESSION: No definite hiatal hernia.  No gastroesophageal reflux.

## 2021-12-24 NOTE — Telephone Encounter (Signed)
Patient notified per Dr Dahlia Byes that her swallow study was normal. She is scheduled for a CT scan and then follow up after that.

## 2021-12-28 ENCOUNTER — Ambulatory Visit: Payer: Medicare PPO | Admitting: Family

## 2022-01-06 ENCOUNTER — Encounter: Payer: Self-pay | Admitting: Family

## 2022-01-06 ENCOUNTER — Ambulatory Visit: Payer: Medicare PPO | Admitting: Family

## 2022-01-06 VITALS — BP 122/78 | HR 76 | Temp 98.0°F | Ht 63.0 in | Wt 191.6 lb

## 2022-01-06 DIAGNOSIS — E1165 Type 2 diabetes mellitus with hyperglycemia: Secondary | ICD-10-CM | POA: Diagnosis not present

## 2022-01-06 DIAGNOSIS — E785 Hyperlipidemia, unspecified: Secondary | ICD-10-CM

## 2022-01-06 LAB — POCT GLYCOSYLATED HEMOGLOBIN (HGB A1C): Hemoglobin A1C: 7 % — AB (ref 4.0–5.6)

## 2022-01-06 MED ORDER — OZEMPIC (0.25 OR 0.5 MG/DOSE) 2 MG/1.5ML ~~LOC~~ SOPN: 0.2500 mg | PEN_INJECTOR | SUBCUTANEOUS | 3 refills | Status: DC

## 2022-01-06 MED ORDER — LOSARTAN POTASSIUM 25 MG PO TABS: 12.5000 mg | ORAL_TABLET | Freq: Every day | ORAL | 1 refills | Status: DC

## 2022-01-06 NOTE — Progress Notes (Signed)
Subjective:    Patient ID: Kathy Bryant, female    DOB: [birth year], 76 y.o.   MRN: [id number]  CC: HENDRIX CONSOLE is a 76 y.o. female who presents today for follow up.   HPI: Accompanied by wife Feels well today.  No new complaints.  She endorses is difficult for her to take medications.  DM-not taking metformin 500 mg as hard to remember to take daily medication.   No constipation.   Hypertension-she is not taking losartan   HLD- she is not taking pravachol  No personal or family h/o thyroid cancer.     HISTORY:  Past Medical History:  Diagnosis Date   Anxiety    Bilateral calf pain    Diabetes mellitus without complication ([place])    PT STATES SHE WAS ON METFORMIN AND STOPPED TAKING THIS ON HER OWN   Fatty liver    Hepatitis 1960'S   B   Herniated disc, cervical    HTN (hypertension)    PT WAS ON BP MEDS AND THEN STATES HER BP WAS UNDER CONTROL AND SHE STOPPED TAKING BP ON HER OWN   Microscopic hematuria    Obesity    Swelling of both lower extremities    Past Surgical History:  Procedure Laterality Date   APPENDECTOMY  1968   BREAST BIOPSY Right 2013   x2 BENIGN BREAST TISSUE WITH FOCAL HYALINIZED STROMA AND BENIGN BREAST TISSUE WITH FOCAL FIBROADENOMATOUS CHANGE    CESAREAN SECTION  1972 & 1978   CHOLECYSTECTOMY     COLONOSCOPY  03/05/2014   COLONOSCOPY WITH PROPOFOL N/A 04/27/2019   Procedure: COLONOSCOPY WITH PROPOFOL;  Surgeon: Lucilla Lame, MD;  Location: [facility];  Service: Endoscopy;  Laterality: N/A;   ESOPHAGOGASTRODUODENOSCOPY (EGD) WITH PROPOFOL N/A 04/27/2019   Procedure: ESOPHAGOGASTRODUODENOSCOPY (EGD) WITH PROPOFOL;  Surgeon: Lucilla Lame, MD;  Location: [facility];  Service: Endoscopy;  Laterality: N/A;   GALLBLADDER SURGERY  1982   GASTRIC RESTRICTION SURGERY  1980   'stomach stapled' with banding   HEMORRHOID SURGERY N/A 12/23/2016   Procedure: HEMORRHOIDECTOMY;  Surgeon: Christene Lye, MD;  Location: [facility];  Service: General;  Laterality: N/A;   HERNIA REPAIR     Abdominal-ventral   POLYPECTOMY N/A 04/27/2019   Procedure: POLYPECTOMY;  Surgeon: Lucilla Lame, MD;  Location: [facility];  Service: Endoscopy;  Laterality: N/A;   TOTAL ABDOMINAL HYSTERECTOMY W/ BILATERAL SALPINGOOPHORECTOMY  1998   Family History  Problem Relation Age of Onset   Diabetes Mother    Cancer Mother 42       Brain Tumor - died age 96   Heart disease Father        CAD - died MI - age 68   Hypertension Father    High Cholesterol Father    Diabetes Sister    Diabetes Brother    Diabetes Brother    Cancer Paternal Aunt        Renal Cell Ca   Cancer Paternal Uncle        Renal Cell Ca   Breast cancer Neg Hx    Thyroid cancer Neg Hx     Allergies: Simvastatin Current Outpatient Medications on File Prior to Visit  Medication Sig Dispense Refill   ALPRAZolam (XANAX) 0.25 MG tablet Take 1 tablet (0.25 mg total) by mouth 2 (two) times daily as needed. for anxiety 30 tablet 1   cyanocobalamin (,VITAMIN B-12,) 1000 MCG/ML injection 1000 mcg (1 mL) intramuscular injection in the thigh (  vastus lateralis)  once per month. 1 mL 15   fluticasone (FLONASE) 50 MCG/ACT nasal spray Place 1 spray into both nostrils 2 (two) times daily. 16 g 0   erythromycin ophthalmic ointment Use one half inch four times daily to affected eye (s) x 7 days. (Patient not taking: Reported on 01/06/2022) 3.5 g 0   pravastatin (PRAVACHOL) 20 MG tablet Take 1 tablet (20 mg total) by mouth once a week. (Patient not taking: Reported on 01/06/2022) 12 tablet 1   No current facility-administered medications on file prior to visit.    Social History   Tobacco Use   Smoking status: Every Day    Types: Cigarettes    Start date: 05/24/2019    Passive exposure: Never   Smokeless tobacco: Never   Tobacco comments:    "I WAS A CLOSET SMOKER AND NEVER SMOKED ALOT"  Vaping Use   Vaping Use: Never used  Substance Use Topics   Alcohol use:  Yes    Comment: Occasional glass of wine   Drug use: No    Review of Systems  Constitutional:  Negative for chills and fever.  Respiratory:  Negative for cough.   Cardiovascular:  Negative for chest pain and palpitations.  Gastrointestinal:  Negative for nausea and vomiting.     Objective:    BP 122/78 (BP Location: Left Arm, Patient Position: Sitting, Cuff Size: Normal)   Pulse 76   Temp 98 F (36.7 C) (Oral)   Ht '5\' 3"'$  (1.6 m)   Wt 191 lb 9.6 oz (86.9 kg)   SpO2 95%   BMI 33.94 kg/m  BP Readings from Last 3 Encounters:  01/06/22 122/78  12/09/21 107/70  11/26/21 102/61   Wt Readings from Last 3 Encounters:  01/06/22 191 lb 9.6 oz (86.9 kg)  12/09/21 191 lb 9.6 oz (86.9 kg)  11/26/21 192 lb (87.1 kg)    Physical Exam Vitals reviewed.  Constitutional:      Appearance: She is well-developed.  Eyes:     Conjunctiva/sclera: Conjunctivae normal.  Cardiovascular:     Rate and Rhythm: Normal rate and regular rhythm.     Pulses: Normal pulses.     Heart sounds: Normal heart sounds.  Pulmonary:     Effort: Pulmonary effort is normal.     Breath sounds: Normal breath sounds. No wheezing, rhonchi or rales.  Musculoskeletal:     Right lower leg: No edema.     Left lower leg: No edema.  Skin:    General: Skin is warm and dry.  Neurological:     Mental Status: She is alert.  Psychiatric:        Speech: Speech normal.        Behavior: Behavior normal.        Thought Content: Thought content normal.       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  Patient has not been compliant with metformin and it is difficult for her to remember a daily medication.  She is also more interested in losing weight.  We have opted to trial Ozempic.  Counseled on black box warning as a relates to medullary  thyroid cancer, multiple endocrine neoplasia.  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  Counseled on the importance of remaining compliant with losartan she  will resume losartan 12.5 mg for renal protection.  We will repeat urine protein in 3 months time.  counseled administration and side effects of ozempic.  Close follow-up.      Relevant Medications   Semaglutide,0.25 or 0.'5MG'$ /DOS, (OZEMPIC, 0.25 OR 0.5 MG/DOSE,) 2 MG/1.5ML SOPN   losartan (COZAAR) 25 MG tablet     Other   HLD (hyperlipidemia)    Uncontrolled.  Patient has not been compliant with Pravachol 20 mg and understands the importance of resuming.  goal of LDL < 70.   will monitor.       Relevant Medications   losartan (COZAAR) 25 MG tablet     I have discontinued Hennesy R. Friedlander's metFORMIN. I have also changed her losartan. Additionally, I am having her start on Ozempic (0.25 or 0.5 MG/DOSE). Lastly, I am having her maintain her pravastatin, erythromycin, ALPRAZolam, cyanocobalamin, and fluticasone.   Meds ordered this encounter  Medications   Semaglutide,0.25 or 0.'5MG'$ /DOS, (OZEMPIC, 0.25 OR 0.5 MG/DOSE,) 2 MG/1.5ML SOPN    Sig: Inject 0.25 mg into the skin once a week.    Dispense:  3 mL    Refill:  3    Order Specific Question:   Supervising Provider    Answer:   Deborra Medina L [[id number]]   losartan (COZAAR) 25 MG tablet    Sig: Take 0.5 tablets (12.5 mg total) by mouth daily.    Dispense:  90 tablet    Refill:  1    Order Specific Question:   Supervising Provider    Answer:   Crecencio Mc [[id number]]    Return precautions given.   Risks, benefits, and alternatives of the medications and treatment plan prescribed today were discussed, and patient expressed understanding.   Education regarding symptom management and diagnosis given to patient on AVS.  Continue to follow with Burnard Hawthorne, FNP for routine health maintenance.   Kathy Bryant and I agreed with plan.   [name], FNP

## 2022-01-06 NOTE — Assessment & Plan Note (Addendum)
Lab Results  Component Value Date   HGBA1C 7.0 (A) 01/06/2022   Uncontrolled.  Patient has not been compliant with metformin and it is difficult for her to remember a daily medication.  She is also more interested in losing weight.  We have opted to trial Ozempic.  Counseled on black box warning as a relates to medullary thyroid cancer, multiple endocrine neoplasia.  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  Counseled on the importance of remaining compliant with losartan she will resume losartan 12.5 mg for renal protection.  We will repeat urine protein in 3 months time.  counseled administration and side effects of ozempic.  Close follow-up.

## 2022-01-06 NOTE — Assessment & Plan Note (Signed)
Uncontrolled.  Patient has not been compliant with Pravachol 20 mg and understands the importance of resuming.  goal of LDL < 70.   will monitor.

## 2022-01-06 NOTE — Patient Instructions (Addendum)
Please resume pravachol and losartan 12.'5mg'$  daily  Start ozempic 0.'25mg'$  once per week once per week injected subcutaneously ( [place])  in stomach. Please clean with alcohol swab prior to injection and be sure to rotate site. You may schedule a nurse visit if you would like to first injection.   After 4 weeks, and if tolerated and weight loss has not reached 1-2 lbs per week, please increase to 0.'5mg'$  once per week [place].    Please read information on medication below and remember black box warning that you may not take if you or a family member is diagnosed with thyroid cancer (medullary thyroid cancer), or multiple endocrine neoplasia ( MEN).       Semaglutide injection solution What is this medicine? SEMAGLUTIDE (Sem a GLOO tide) is used to improve blood sugar control in adults with type 2 diabetes. This medicine may be used with other diabetes medicines. This drug may also reduce the risk of heart attack or stroke if you have type 2 diabetes and risk factors for heart disease. This medicine may be used for other purposes; ask your health care provider or pharmacist if you have questions. COMMON BRAND NAME(S): OZEMPIC What should I tell my health care provider before I take this medicine? They need to know if you have any of these conditions: endocrine tumors (MEN 2) or if someone in your family had these tumors eye disease, vision problems history of pancreatitis kidney disease stomach problems thyroid cancer or if someone in your family had thyroid cancer an unusual or allergic reaction to semaglutide, other medicines, foods, dyes, or preservatives pregnant or trying to get pregnant breast-feeding How should I use this medicine? This medicine is for injection under the skin of your upper leg (thigh), stomach area, or upper arm. It is given once every week (every 7 days). You will be taught how to prepare and give this medicine. Use exactly as directed. Take your medicine at regular intervals.  Do not take it more often than directed. If you use this medicine with insulin, you should inject this medicine and the insulin separately. Do not mix them together. Do not give the injections right next to each other. Change (rotate) injection sites with each injection. It is important that you put your used needles and syringes in a special sharps container. Do not put them in a trash can. If you do not have a sharps container, call your pharmacist or healthcare provider to get one. A special MedGuide will be given to you by the pharmacist with each prescription and refill. Be sure to read this information carefully each time. This drug comes with INSTRUCTIONS FOR USE. Ask your pharmacist for directions on how to use this drug. Read the information carefully. Talk to your pharmacist or health care provider if you have questions. Talk to your pediatrician regarding the use of this medicine in children. Special care may be needed. Overdosage: If you think you have taken too much of this medicine contact a poison control center or emergency room at once. NOTE: This medicine is only for you. Do not share this medicine with others. What if I miss a dose? If you miss a dose, take it as soon as you can within 5 days after the missed dose. Then take your next dose at your regular weekly time. If it has been longer than 5 days after the missed dose, do not take the missed dose. Take the next dose at your regular time. Do not take  double or extra doses. If you have questions about a missed dose, contact your health care provider for advice. What may interact with this medicine? other medicines for diabetes Many medications may cause changes in blood sugar, these include: alcohol containing beverages antiviral medicines for HIV or AIDS aspirin and aspirin-like drugs certain medicines for blood pressure, heart disease, irregular heart beat chromium diuretics female hormones, such as estrogens or progestins,  birth control pills fenofibrate gemfibrozil isoniazid lanreotide female hormones or anabolic steroids MAOIs like Carbex, Eldepryl, Marplan, Nardil, and Parnate medicines for weight loss medicines for allergies, asthma, cold, or cough medicines for depression, anxiety, or psychotic disturbances niacin nicotine NSAIDs, medicines for pain and inflammation, like ibuprofen or naproxen octreotide pasireotide pentamidine phenytoin probenecid quinolone antibiotics such as ciprofloxacin, levofloxacin, ofloxacin some herbal dietary supplements steroid medicines such as prednisone or cortisone sulfamethoxazole; trimethoprim thyroid hormones Some medications can hide the warning symptoms of low blood sugar (hypoglycemia). You may need to monitor your blood sugar more closely if you are taking one of these medications. These include: beta-blockers, often used for high blood pressure or heart problems (examples include atenolol, metoprolol, propranolol) clonidine guanethidine reserpine This list may not describe all possible interactions. Give your health care provider a list of all the medicines, herbs, non-prescription drugs, or dietary supplements you use. Also tell them if you smoke, drink alcohol, or use illegal drugs. Some items may interact with your medicine. What should I watch for while using this medicine? Visit your doctor or health care professional for regular checks on your progress. Drink plenty of fluids while taking this medicine. Check with your doctor or health care professional if you get an attack of severe diarrhea, nausea, and vomiting. The loss of too much body fluid can make it dangerous for you to take this medicine. A test called the HbA1C (A1C) will be monitored. This is a simple blood test. It measures your blood sugar control over the last 2 to 3 months. You will receive this test every 3 to 6 months. Learn how to check your blood sugar. Learn the symptoms of low and  high blood sugar and how to manage them. Always carry a quick-source of sugar with you in case you have symptoms of low blood sugar. Examples include hard sugar candy or glucose tablets. Make sure others know that you can choke if you eat or drink when you develop serious symptoms of low blood sugar, such as seizures or unconsciousness. They must get medical help at once. Tell your doctor or health care professional if you have high blood sugar. You might need to change the dose of your medicine. If you are sick or exercising more than usual, you might need to change the dose of your medicine. Do not skip meals. Ask your doctor or health care professional if you should avoid alcohol. Many nonprescription cough and cold products contain sugar or alcohol. These can affect blood sugar. Pens should never be shared. Even if the needle is changed, sharing may result in passing of viruses like hepatitis or HIV. Wear a medical ID bracelet or chain, and carry a card that describes your disease and details of your medicine and dosage times. Do not become pregnant while taking this medicine. Women should inform their doctor if they wish to become pregnant or think they might be pregnant. There is a potential for serious side effects to an unborn child. Talk to your health care professional or pharmacist for more information. What side effects may  I notice from receiving this medicine? Side effects that you should report to your doctor or health care professional as soon as possible: allergic reactions like skin rash, itching or hives, swelling of the face, lips, or tongue breathing problems changes in vision diarrhea that continues or is severe lump or swelling on the neck severe nausea signs and symptoms of infection like fever or chills; cough; sore throat; pain or trouble passing urine signs and symptoms of low blood sugar such as feeling anxious, confusion, dizziness, increased hunger, unusually weak or  tired, sweating, shakiness, cold, irritable, headache, blurred vision, fast heartbeat, loss of consciousness signs and symptoms of kidney injury like trouble passing urine or change in the amount of urine trouble swallowing unusual stomach upset or pain vomiting Side effects that usually do not require medical attention (report to your doctor or health care professional if they continue or are bothersome): constipation diarrhea nausea pain, redness, or irritation at site where injected stomach upset This list may not describe all possible side effects. Call your doctor for medical advice about side effects. You may report side effects to FDA at 1-800-FDA-1088. Where should I keep my medicine? Keep out of the reach of children. Store unopened pens in a refrigerator between 2 and 8 degrees C (36 and 46 degrees F). Do not freeze. Protect from light and heat. After you first use the pen, it can be stored for 56 days at room temperature between 15 and 30 degrees C (59 and 86 degrees F) or in a refrigerator. Throw away your used pen after 56 days or after the expiration date, whichever comes first. Do not store your pen with the needle attached. If the needle is left on, medicine may leak from the pen. NOTE: This sheet is a summary. It may not cover all possible information. If you have questions about this medicine, talk to your doctor, pharmacist, or health care provider.  2021 Elsevier/Gold Standard (2019-04-10 09:41:51)

## 2022-01-07 ENCOUNTER — Ambulatory Visit
Admission: RE | Admit: 2022-01-07 | Discharge: 2022-01-07 | Disposition: A | Discharge status: Home / Self Care | Payer: Medicare PPO | Source: Ambulatory Visit | Attending: Surgery | Admitting: Surgery

## 2022-01-07 DIAGNOSIS — R160 Hepatomegaly, not elsewhere classified: Secondary | ICD-10-CM | POA: Diagnosis not present

## 2022-01-07 DIAGNOSIS — K5730 Diverticulosis of large intestine without perforation or abscess without bleeding: Secondary | ICD-10-CM | POA: Diagnosis not present

## 2022-01-07 DIAGNOSIS — I700 Atherosclerosis of aorta: Secondary | ICD-10-CM | POA: Diagnosis not present

## 2022-01-07 DIAGNOSIS — K449 Diaphragmatic hernia without obstruction or gangrene: Secondary | ICD-10-CM | POA: Diagnosis not present

## 2022-01-07 DIAGNOSIS — K760 Fatty (change of) liver, not elsewhere classified: Secondary | ICD-10-CM | POA: Diagnosis not present

## 2022-01-07 LAB — POCT I-STAT CREATININE: Creatinine, Ser: 0.9 mg/dL (ref 0.44–1.00)

## 2022-01-07 MED ORDER — IOHEXOL 300 MG/ML  SOLN
100.0000 mL | Freq: Once | INTRAMUSCULAR | Status: AC | PRN
  Administered 2022-01-07: 100 mL via INTRAVENOUS

## 2022-01-11 ENCOUNTER — Telehealth: Payer: Self-pay

## 2022-01-11 NOTE — Telephone Encounter (Signed)
-----   Message from Jules Husbands, MD sent at 01/11/2022  8:33 AM EDT ----- Please let her know CT did not show any surprises just A small hiatal hernia ----- Message ----- From: Interface, Rad Results In Sent: 01/08/2022   8:59 PM EDT To: Jules Husbands, MD

## 2022-01-11 NOTE — Telephone Encounter (Signed)
Patient notified of CT results. Patient has decided to cancel her follow up as she is having no symptoms. She will call to reschedule in future if needed.

## 2022-01-13 ENCOUNTER — Ambulatory Visit: Payer: Medicare PPO | Admitting: Surgery

## 2022-01-13 ENCOUNTER — Other Ambulatory Visit (INDEPENDENT_AMBULATORY_CARE_PROVIDER_SITE_OTHER): Payer: Medicare PPO

## 2022-01-13 DIAGNOSIS — E1165 Type 2 diabetes mellitus with hyperglycemia: Secondary | ICD-10-CM | POA: Diagnosis not present

## 2022-01-13 LAB — BASIC METABOLIC PANEL WITH GFR
BUN: 15 mg/dL (ref 6–23)
CO2: 28 meq/L (ref 19–32)
Calcium: 9.3 mg/dL (ref 8.4–10.5)
Chloride: 103 meq/L (ref 96–112)
Creatinine, Ser: 0.93 mg/dL (ref 0.40–1.20)
GFR: 60.55 mL/min
Glucose, Bld: 135 mg/dL — ABNORMAL HIGH (ref 70–99)
Potassium: 4.2 meq/L (ref 3.5–5.1)
Sodium: 140 meq/L (ref 135–145)

## 2022-01-15 ENCOUNTER — Telehealth: Payer: Self-pay | Admitting: Family

## 2022-01-15 ENCOUNTER — Other Ambulatory Visit: Payer: Self-pay

## 2022-01-15 DIAGNOSIS — K7460 Unspecified cirrhosis of liver: Secondary | ICD-10-CM

## 2022-01-15 DIAGNOSIS — E1165 Type 2 diabetes mellitus with hyperglycemia: Secondary | ICD-10-CM

## 2022-01-15 DIAGNOSIS — E785 Hyperlipidemia, unspecified: Secondary | ICD-10-CM

## 2022-01-15 MED ORDER — PRAVASTATIN SODIUM 20 MG PO TABS: 20.0000 mg | ORAL_TABLET | ORAL | 1 refills | Status: DC

## 2022-01-15 NOTE — Telephone Encounter (Signed)
Pt need refill on pravastatin sent [facility] [address]

## 2022-01-15 NOTE — Telephone Encounter (Signed)
Refill sent.

## 2022-04-09 ENCOUNTER — Encounter: Payer: Self-pay | Admitting: Family

## 2022-04-09 ENCOUNTER — Ambulatory Visit: Payer: Medicare PPO | Admitting: Family

## 2022-04-09 VITALS — BP 116/78 | HR 75 | Temp 98.3°F | Ht 65.0 in | Wt 182.4 lb

## 2022-04-09 DIAGNOSIS — G8929 Other chronic pain: Secondary | ICD-10-CM | POA: Diagnosis not present

## 2022-04-09 DIAGNOSIS — E785 Hyperlipidemia, unspecified: Secondary | ICD-10-CM

## 2022-04-09 DIAGNOSIS — E118 Type 2 diabetes mellitus with unspecified complications: Secondary | ICD-10-CM | POA: Diagnosis not present

## 2022-04-09 DIAGNOSIS — R55 Syncope and collapse: Secondary | ICD-10-CM

## 2022-04-09 DIAGNOSIS — M25572 Pain in left ankle and joints of left foot: Secondary | ICD-10-CM

## 2022-04-09 DIAGNOSIS — R42 Dizziness and giddiness: Secondary | ICD-10-CM

## 2022-04-09 LAB — POCT GLYCOSYLATED HEMOGLOBIN (HGB A1C): Hemoglobin A1C: 6 % — AB (ref 4.0–5.6)

## 2022-04-09 MED ORDER — DICLOFENAC SODIUM 1 % EX GEL: 4.0000 g | Freq: Four times a day (QID) | CUTANEOUS | 3 refills | Status: DC

## 2022-04-09 NOTE — Assessment & Plan Note (Signed)
Lab Results  Component Value Date   HGBA1C 6.0 (A) 04/09/2022   Excellent control.  Continue Ozempic 0.25 mg

## 2022-04-09 NOTE — Assessment & Plan Note (Signed)
Chronic, stable.  Patient is compliant with pravastatin 20 mg once weekly.  She declines increasing dose at this time.  We will recheck lipid panel in a few months

## 2022-04-09 NOTE — Patient Instructions (Addendum)
Trial Losartan 12.'5mg'$  every other day  Let me know if episode of dizziness recurs and most certainly if any worrisome features such as vision changes numbness, headache or dizziness were persistent.  This would warrant emergency room evaluation  For left ankle, Trial voltaren gel , ice twice daily for 20 minutes and ace wrap for support.    Nice to see you as always!

## 2022-04-09 NOTE — Assessment & Plan Note (Signed)
Brief, episodic dizziness occurring while she was standing for 3 to 4 minutes while singing at church only.  No syncopal episodes or associated chest pain.  Discussed at length importance of hydration particularly with her low end blood pressure.  Patient and I agreed no further evaluation at this time as long as alarm symptoms are not present.   we did discuss retrialing losartan in the setting of a history of proteinuria however she will start at 12.5 mg and take every other day.  She will let me know if presyncopal episodes become more persistent or occur outside of standing in church.

## 2022-04-09 NOTE — Assessment & Plan Note (Addendum)
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 No injury.  Advised conservative measures including icing regimen, Voltaren gel, ace wrap ( provided today). Declines imaging today.  If no improvement, would like for her to see sports medicine.  She will let me know

## 2022-04-09 NOTE — Progress Notes (Signed)
Subjective:    Patient ID: Kathy Bryant, female    DOB: 06-21-1946, 76 y.o.   MRN: [id number]  CC: Kathy Bryant is a 76 y.o. female who presents today for follow up.   HPI: Left medial ankle pain with stairs ongoing for over a 1 year  No twisting, falls, numbness or swelling     Episodic dizziness over the past month. She notices after standing for 3 minutes are singing at church. No episode when she has a 'break sitting' between sounds. Resolves immediately with sitting.  She doesn't drink much water.   No loc, ha, vision changes.   She wasn't on with losartan 12.'5mg'$  during this time as she had ran out.     HLD- compliant with pravastatin 20 mg once weekly  DM-  started ozempic 0.'25mg'$  and tolerating . She has lost weight  No constipation, nausea.      HISTORY:  Past Medical History:  Diagnosis Date   Anxiety    Bilateral calf pain    Diabetes mellitus without complication ([place])    PT STATES SHE WAS ON METFORMIN AND STOPPED TAKING THIS ON HER OWN   Fatty liver    Hepatitis 1960'S   B   Herniated disc, cervical    HTN (hypertension)    PT WAS ON BP MEDS AND THEN STATES HER BP WAS UNDER CONTROL AND SHE STOPPED TAKING BP ON HER OWN   Microscopic hematuria    Obesity    Swelling of both lower extremities    Past Surgical History:  Procedure Laterality Date   APPENDECTOMY  1968   BREAST BIOPSY Right 2013   x2 BENIGN BREAST TISSUE WITH FOCAL HYALINIZED STROMA AND BENIGN BREAST TISSUE WITH FOCAL FIBROADENOMATOUS CHANGE    CESAREAN SECTION  1972 & 1978   CHOLECYSTECTOMY     COLONOSCOPY  03/05/2014   COLONOSCOPY WITH PROPOFOL N/A 04/27/2019   Procedure: COLONOSCOPY WITH PROPOFOL;  Surgeon: Lucilla Lame, MD;  Location: [facility];  Service: Endoscopy;  Laterality: N/A;   ESOPHAGOGASTRODUODENOSCOPY (EGD) WITH PROPOFOL N/A 04/27/2019   Procedure: ESOPHAGOGASTRODUODENOSCOPY (EGD) WITH PROPOFOL;  Surgeon: Lucilla Lame, MD;  Location: [facility];  Service: Endoscopy;  Laterality: N/A;   GALLBLADDER SURGERY  1982   GASTRIC RESTRICTION SURGERY  1980   'stomach stapled' with banding   HEMORRHOID SURGERY N/A 12/23/2016   Procedure: HEMORRHOIDECTOMY;  Surgeon: Christene Lye, MD;  Location: [facility];  Service: General;  Laterality: N/A;   HERNIA REPAIR     Abdominal-ventral   POLYPECTOMY N/A 04/27/2019   Procedure: POLYPECTOMY;  Surgeon: Lucilla Lame, MD;  Location: [facility];  Service: Endoscopy;  Laterality: N/A;   TOTAL ABDOMINAL HYSTERECTOMY W/ BILATERAL SALPINGOOPHORECTOMY  1998   Family History  Problem Relation Age of Onset   Diabetes Mother    Cancer Mother 65       Brain Tumor - died age 71   Heart disease Father        CAD - died MI - age 76   Hypertension Father    High Cholesterol Father    Diabetes Sister    Diabetes Brother    Diabetes Brother    Cancer Paternal Aunt        Renal Cell Ca   Cancer Paternal Uncle        Renal Cell Ca   Breast cancer Neg Hx    Thyroid cancer Neg Hx     Allergies: Simvastatin Current Outpatient Medications on File  Prior to Visit  Medication Sig Dispense Refill   ALPRAZolam (XANAX) 0.25 MG tablet Take 1 tablet (0.25 mg total) by mouth 2 (two) times daily as needed. for anxiety 30 tablet 1   cyanocobalamin (,VITAMIN B-12,) 1000 MCG/ML injection 1000 mcg (1 mL) intramuscular injection in the thigh ( vastus lateralis)  once per month. 1 mL 15   fluticasone (FLONASE) 50 MCG/ACT nasal spray Place 1 spray into both nostrils 2 (two) times daily. 16 g 0   losartan (COZAAR) 25 MG tablet Take 0.5 tablets (12.5 mg total) by mouth daily. 90 tablet 1   pravastatin (PRAVACHOL) 20 MG tablet Take 1 tablet (20 mg total) by mouth once a week. 12 tablet 1   Semaglutide,0.25 or 0.'5MG'$ /DOS, (OZEMPIC, 0.25 OR 0.5 MG/DOSE,) 2 MG/1.5ML SOPN Inject 0.25 mg into the skin once a week. 3 mL 3   No current facility-administered medications on file prior to visit.    Social History    Tobacco Use   Smoking status: Every Day    Types: Cigarettes    Start date: 05/24/2019    Passive exposure: Never   Smokeless tobacco: Never   Tobacco comments:    "I WAS A CLOSET SMOKER AND NEVER SMOKED ALOT"  Vaping Use   Vaping Use: Never used  Substance Use Topics   Alcohol use: Yes    Comment: Occasional glass of wine   Drug use: No    Review of Systems  Constitutional:  Negative for chills and fever.  Eyes:  Negative for visual disturbance.  Respiratory:  Negative for cough.   Cardiovascular:  Negative for chest pain, palpitations and leg swelling.  Gastrointestinal:  Negative for nausea and vomiting.  Musculoskeletal:  Positive for arthralgias.  Neurological:  Negative for dizziness (resolved), syncope and headaches.      Objective:    BP 116/78 (BP Location: Left Arm, Patient Position: Sitting, Cuff Size: Normal)   Pulse 75   Temp 98.3 F (36.8 C) (Oral)   Ht '5\' 5"'$  (1.651 m)   Wt 182 lb 6.4 oz (82.7 kg)   SpO2 (!) 75%   BMI 30.35 kg/m  BP Readings from Last 3 Encounters:  04/09/22 116/78  01/06/22 122/78  12/09/21 107/70   Wt Readings from Last 3 Encounters:  04/09/22 182 lb 6.4 oz (82.7 kg)  01/06/22 191 lb 9.6 oz (86.9 kg)  12/09/21 191 lb 9.6 oz (86.9 kg)    Physical Exam Vitals reviewed.  Constitutional:      Appearance: She is well-developed.  Eyes:     Conjunctiva/sclera: Conjunctivae normal.  Cardiovascular:     Rate and Rhythm: Normal rate and regular rhythm.     Pulses: Normal pulses.     Heart sounds: Normal heart sounds.  Pulmonary:     Effort: Pulmonary effort is normal.     Breath sounds: Normal breath sounds. No wheezing, rhonchi or rales.  Musculoskeletal:     Right ankle: No swelling. No tenderness. Normal range of motion.     Left ankle: No swelling. Normal range of motion.     Comments: Mild pain appreciated over medial malleolus.  No pain over lateral malleolus, base of the fifth metatarsal or navicular bone. Able to  plantar and dorsi flex without pain.  Sensation intact equally bilateral lower extremities.   Skin:    General: Skin is warm and dry.  Neurological:     Mental Status: She is alert.  Psychiatric:        Speech: Speech normal.  Behavior: Behavior normal.        Thought Content: Thought content normal.        Assessment & Plan:   Problem List Items Addressed This Visit       Cardiovascular and Mediastinum   Postural dizziness with presyncope    Brief, episodic dizziness occurring while she was standing for 3 to 4 minutes while singing at church only.  No syncopal episodes or associated chest pain.  Discussed at length importance of hydration particularly with her low end blood pressure.  Patient and I agreed no further evaluation at this time as long as alarm symptoms are not present.   we did discuss retrialing losartan in the setting of a history of proteinuria however she will start at 12.5 mg and take every other day.  She will let me know if presyncopal episodes become more persistent or occur outside of standing in church.         Endocrine   DM (diabetes mellitus) with complications ([place]) - Primary    Lab Results  Component Value Date   HGBA1C 6.0 (A) 04/09/2022  Excellent control.  Continue Ozempic 0.25 mg       Relevant Orders   Microalbumin / creatinine urine ratio   POCT HgB A1C (Completed)     Other   HLD (hyperlipidemia)    Chronic, stable.  Patient is compliant with pravastatin 20 mg once weekly.  She declines increasing dose at this time.  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 No injury.  Advised conservative measures including icing regimen, Voltaren gel, ace wrap ( provided today). Declines imaging today.  If no improvement, would like for her to see sports medicine.  She will let me know      Relevant Medications   diclofenac Sodium (VOLTAREN) 1 % GEL     I  have discontinued Petrice R. Vondrak's erythromycin. I am also having her start on diclofenac Sodium. Additionally, I am having her maintain her ALPRAZolam, cyanocobalamin, fluticasone, Ozempic (0.25 or 0.5 MG/DOSE), losartan, and pravastatin.   Meds ordered this encounter  Medications   diclofenac Sodium (VOLTAREN) 1 % GEL    Sig: Apply 4 g topically 4 (four) times daily.    Dispense:  50 g    Refill:  3    Order Specific Question:   Supervising Provider    Answer:   Crecencio Mc [[id number]]    Return precautions given.   Risks, benefits, and alternatives of the medications and treatment plan prescribed today were discussed, and patient expressed understanding.   Education regarding symptom management and diagnosis given to patient on AVS.  Continue to follow with Burnard Hawthorne, FNP for routine health maintenance.   Kathy Bryant and I agreed with plan.   [name], FNP

## 2022-04-13 ENCOUNTER — Other Ambulatory Visit: Payer: Self-pay

## 2022-04-13 ENCOUNTER — Telehealth: Payer: Self-pay | Admitting: Family

## 2022-04-13 DIAGNOSIS — E1165 Type 2 diabetes mellitus with hyperglycemia: Secondary | ICD-10-CM

## 2022-04-13 LAB — MICROALBUMIN / CREATININE URINE RATIO
Creatinine,U: 178.6 mg/dL
Microalb Creat Ratio: 1.6 mg/g (ref 0.0–30.0)
Microalb, Ur: 2.9 mg/dL — ABNORMAL HIGH (ref 0.0–1.9)

## 2022-04-13 MED ORDER — OZEMPIC (0.25 OR 0.5 MG/DOSE) 2 MG/1.5ML ~~LOC~~ SOPN: 0.2500 mg | PEN_INJECTOR | SUBCUTANEOUS | 3 refills | Status: DC

## 2022-04-13 NOTE — Telephone Encounter (Signed)
Refill sent patient notified

## 2022-04-13 NOTE — Telephone Encounter (Signed)
Patient called and needs a refill on her Semaglutide,0.25 or 0.'5MG'$ /DOS, (OZEMPIC, 0.25 OR 0.5 MG/DOSE,) 2 MG/1.5ML SOPN.

## 2022-05-10 ENCOUNTER — Other Ambulatory Visit: Payer: Self-pay

## 2022-05-10 ENCOUNTER — Encounter: Payer: Self-pay | Admitting: Gastroenterology

## 2022-05-10 DIAGNOSIS — K7460 Unspecified cirrhosis of liver: Secondary | ICD-10-CM

## 2022-05-17 NOTE — Addendum Note (Signed)
Addended by: Lurlean Nanny on: 05/17/2022 11:05 AM   Modules accepted: Orders

## 2022-05-31 ENCOUNTER — Ambulatory Visit: Payer: Medicare PPO

## 2022-06-03 ENCOUNTER — Ambulatory Visit
Admission: RE | Admit: 2022-06-03 | Discharge: 2022-06-03 | Disposition: A | Discharge status: Home / Self Care | Payer: Medicare PPO | Source: Ambulatory Visit | Attending: Gastroenterology | Admitting: Gastroenterology

## 2022-06-03 DIAGNOSIS — K838 Other specified diseases of biliary tract: Secondary | ICD-10-CM | POA: Diagnosis not present

## 2022-06-03 DIAGNOSIS — K760 Fatty (change of) liver, not elsewhere classified: Secondary | ICD-10-CM | POA: Diagnosis not present

## 2022-06-03 DIAGNOSIS — N281 Cyst of kidney, acquired: Secondary | ICD-10-CM | POA: Diagnosis not present

## 2022-06-03 DIAGNOSIS — K7460 Unspecified cirrhosis of liver: Secondary | ICD-10-CM

## 2022-06-03 DIAGNOSIS — K862 Cyst of pancreas: Secondary | ICD-10-CM | POA: Diagnosis not present

## 2022-06-03 MED ORDER — GADOBENATE DIMEGLUMINE 529 MG/ML IV SOLN
17.0000 mL | Freq: Once | INTRAVENOUS | Status: AC | PRN
  Administered 2022-06-03: 17 mL via INTRAVENOUS

## 2022-06-11 ENCOUNTER — Encounter: Payer: Self-pay | Admitting: Gastroenterology

## 2022-06-24 DIAGNOSIS — H524 Presbyopia: Secondary | ICD-10-CM | POA: Diagnosis not present

## 2022-06-24 DIAGNOSIS — E119 Type 2 diabetes mellitus without complications: Secondary | ICD-10-CM | POA: Diagnosis not present

## 2022-06-24 DIAGNOSIS — Z961 Presence of intraocular lens: Secondary | ICD-10-CM | POA: Diagnosis not present

## 2022-07-19 ENCOUNTER — Telehealth (INDEPENDENT_AMBULATORY_CARE_PROVIDER_SITE_OTHER): Payer: Medicare PPO | Admitting: Family Medicine

## 2022-07-19 ENCOUNTER — Other Ambulatory Visit: Payer: Medicare PPO

## 2022-07-19 ENCOUNTER — Ambulatory Visit (INDEPENDENT_AMBULATORY_CARE_PROVIDER_SITE_OTHER): Payer: Medicare PPO

## 2022-07-19 ENCOUNTER — Telehealth: Payer: Self-pay | Admitting: Family

## 2022-07-19 DIAGNOSIS — R059 Cough, unspecified: Secondary | ICD-10-CM

## 2022-07-19 MED ORDER — PREDNISONE 10 MG (21) PO TBPK: ORAL_TABLET | ORAL | 0 refills | Status: AC

## 2022-07-19 MED ORDER — AZITHROMYCIN 250 MG PO TABS: ORAL_TABLET | ORAL | 0 refills | Status: AC

## 2022-07-19 MED ORDER — ALBUTEROL SULFATE HFA 108 (90 BASE) MCG/ACT IN AERS: 1.0000 | INHALATION_SPRAY | Freq: Four times a day (QID) | RESPIRATORY_TRACT | 0 refills | Status: AC | PRN

## 2022-07-19 NOTE — Patient Instructions (Addendum)
It was a pleasure meeting you today. Thank you for allowing me to take part in your health care.  Our goals for today as we discussed include:   COVID, Flu and RSV have been sent. Will have to wait for results.  This may take up to 2-3 days.  Start Prednisone taper pack Start Zithromax 500 mg on day one then 250 mg daily for 4 days  Symptomatic management for fever, muscle aches and headaches  -Ibuprofen 200 mg every 8 hours as needed  Stay well hydrated Rest as needed with frequent repositioning and ambulation.  Increase activity as soon as tolerated to help with recovery.  Continue wearing masks, hand washing and self isolation until symptom free.  If you have worsening symptoms, especially difficulty breathing please call 911 or have someone take you to the emergency department.    If you have any questions or concerns, please do not hesitate to call the office at [phone].  I look forward to our next visit and until then take care and stay safe.  Regards,   Carollee Leitz, MD   [facility]

## 2022-07-19 NOTE — Progress Notes (Signed)
Virtual Visit via Telephone Note  I connected with Kathy Bryant on 08/03/22 at 1310 by telephone and verified that I am speaking with the correct person using two identifiers. Kathy Bryant is currently located at home and  her husband is currently with her during this visit. The provider, Carollee Leitz, MD, is located in their office at time of visit.  I discussed the limitations, risks, security and privacy concerns of performing an evaluation and management service by telephone and the availability of in person appointments. I also discussed with the patient that there may be a patient responsible charge related to this service. The patient expressed understanding and agreed to proceed.  Subjective: PCP: Burnard Hawthorne, FNP  Chief Complaint  Patient presents with   Acute Visit    Coughing Fever Sneezing Eyeballs Hurt x 10 days   Patient reports symptoms started 10 days ago.  Initially started with sneezing, cough started following day.  She reports having chills and felt feverish for the next 2 days.  Took ibuprofen.  Seem to be improved a little.  Went to work and was able to stay at work all day but felt weaker.  Progressively got worse throughout the day.  Had some diaphoreses.  Denies any wheezing, shortness of breath, chest pain, sore throat or difficulty swallowing.  No decrease in appetite, nausea or vomiting, urinary symptoms or diarrhea.  Endorses runny nose.  Has not had COVID testing.  Endorses tobacco use.   ROS: Per HPI  Current Outpatient Medications:    albuterol (VENTOLIN HFA) 108 (90 Base) MCG/ACT inhaler, Inhale 1-2 puffs into the lungs every 6 (six) hours as needed for wheezing or shortness of breath., Disp: 8 g, Rfl: 0   ALPRAZolam (XANAX) 0.25 MG tablet, Take 1 tablet (0.25 mg total) by mouth 2 (two) times daily as needed. for anxiety, Disp: 30 tablet, Rfl: 1   cyanocobalamin (,VITAMIN B-12,) 1000 MCG/ML injection, 1000 mcg (1 mL) intramuscular  injection in the thigh ( vastus lateralis)  once per month., Disp: 1 mL, Rfl: 15   fluticasone (FLONASE) 50 MCG/ACT nasal spray, Place 1 spray into both nostrils 2 (two) times daily., Disp: 16 g, Rfl: 0   losartan (COZAAR) 25 MG tablet, Take 0.5 tablets (12.5 mg total) by mouth daily., Disp: 90 tablet, Rfl: 1   pravastatin (PRAVACHOL) 20 MG tablet, Take 1 tablet (20 mg total) by mouth once a week., Disp: 12 tablet, Rfl: 1   Semaglutide,0.25 or 0.'5MG'$ /DOS, (OZEMPIC, 0.25 OR 0.5 MG/DOSE,) 2 MG/1.5ML SOPN, Inject 0.25 mg into the skin once a week., Disp: 3 mL, Rfl: 3  Observations/Objective: A&O  No respiratory distress or wheezing audible over the phone Mood, judgement, and thought processes all WNL  Assessment and Plan: Cough in adult patient Likely secondary to viral etiology.   -     COVID-19, Flu A+B and RSV; Future -     DG Chest 2 View; Future -     predniSONE; Take 3 tablets (30 mg total) by mouth daily for 1 day, THEN 2 tablets (20 mg total) daily for 1 day, THEN 1 tablet (10 mg total) daily for 3 days.  Dispense: 8 tablet; Refill: 0 -     Azithromycin; Take 2 tablets on day 1, then 1 tablet daily on days 2 through 5  Dispense: 6 tablet; Refill: 0 -     Albuterol Sulfate HFA; Inhale 1-2 puffs into the lungs every 6 (six) hours as needed for wheezing or shortness  of breath.  Dispense: 8 g; Refill: 0 -     DG Chest 2 View; Future    Follow Up Instructions: Return if symptoms worsen or fail to improve.  I discussed the assessment and treatment plan with the patient. The patient was provided an opportunity to ask questions and all were answered. The patient agreed with the plan and demonstrated an understanding of the instructions.   The patient was advised to call back or seek an in-person evaluation if the symptoms worsen or if the condition fails to improve as anticipated.  The above assessment and management plan was discussed with the patient. The patient verbalized understanding  of and has agreed to the management plan. Patient is aware to call the clinic if symptoms persist or worsen. Patient is aware when to return to the clinic for a follow-up visit. Patient educated on when it is appropriate to go to the emergency department.   Time call ended: 1330  I provided 20 minutes of non-face-to-face time during this encounter.  Carollee Leitz, MD

## 2022-07-19 NOTE — Telephone Encounter (Signed)
Pt called wanting to know if the provider called in a prescription for her cough. Pt stated she can not wait for two days for the results to come back. Pt would like to be called

## 2022-07-21 ENCOUNTER — Other Ambulatory Visit: Payer: Self-pay | Admitting: Family Medicine

## 2022-07-21 ENCOUNTER — Telehealth: Payer: Self-pay | Admitting: Family

## 2022-07-21 DIAGNOSIS — J101 Influenza due to other identified influenza virus with other respiratory manifestations: Secondary | ICD-10-CM

## 2022-07-21 LAB — COVID-19, FLU A+B AND RSV
Influenza A, NAA: DETECTED — AB
Influenza B, NAA: NOT DETECTED
RSV, NAA: NOT DETECTED
SARS-CoV-2, NAA: NOT DETECTED

## 2022-07-21 MED ORDER — OSELTAMIVIR PHOSPHATE 75 MG PO CAPS: 75.0000 mg | ORAL_CAPSULE | Freq: Two times a day (BID) | ORAL | 0 refills | Status: AC

## 2022-07-21 NOTE — Progress Notes (Signed)
Positive for Influenza A. Patient advised of results. Stop antibiotics Stop steroids Start Tamiflu 75 mg twice daily x 5 days. Strict return precautions provided   Carollee Leitz, MD

## 2022-07-21 NOTE — Progress Notes (Signed)
FYI, called patient to notify and discontinue antibiotics and steroids, treated with Tamiflu

## 2022-07-21 NOTE — Telephone Encounter (Signed)
Pt did not go to ed Please she how she feeling after syncope  Ensure no cp, palpitations, dizziness which warrant being seen in ED  If she is feeling better, please sch routine f/u appt with me

## 2022-07-21 NOTE — Telephone Encounter (Signed)
Prescription was called in on 11/11. I spoke with her again this morning and therapy has changed

## 2022-07-21 NOTE — Telephone Encounter (Signed)
-----   Message from Suzanna Obey, [place] sent at 07/21/2022  9:06 AM EST ----- Called Patient to get her scheduled for repeat  chest xray for Influenza A in 3 weeks and the Patient stated I just passed out on my kitchen floor. I got off of the phone after I made sure she was okay and went to speak with her PCP -[name]. Per Joycelyn Schmid the Patent needs to go to [facility] walk in and get checked. The Patient states her daughter is a 3rd shift nurse and is on the way to give her fluids then if the Patient is not better they will go to [facility] In. I let Patient know that Joycelyn Schmid strongly advises go to [facility] In because she is really concerned about the Patient.

## 2022-07-22 NOTE — Telephone Encounter (Signed)
Spoke with patient & she just feels weak today. She went to the health bar and received a vitamin infusion today. Denies any dizziness or palpitations. She was lightheaded yesterday but that has subsided. She also thinks that she may have bruised her hip when she passed out yesterday.   Pt agreed to go to ED if sx's worsen.  And will call for an appt if sx's doesn't improve.

## 2022-07-23 NOTE — Telephone Encounter (Signed)
noted 

## 2022-07-26 ENCOUNTER — Telehealth: Payer: Self-pay | Admitting: Family

## 2022-07-26 NOTE — Telephone Encounter (Signed)
Patient called and would like a call back about lab results.

## 2022-07-26 NOTE — Telephone Encounter (Signed)
Pt called stating she is lightheaded and short of breath. Sent to access nurse

## 2022-07-27 NOTE — Telephone Encounter (Signed)
LVM to call back to office  

## 2022-07-28 NOTE — Telephone Encounter (Signed)
LVM to call back to office  

## 2022-07-29 NOTE — Telephone Encounter (Signed)
Spoke with patient & states that her supervisor took her to the health bar instead. Dr Jimmye Norman put her on a breathing treatment. Pt states that her breathing is better & her lightheadedness has subsided. But she was evaluated by the fire dept & they told her it was a 5 hour wait at the ED.  Pt aware to let us know if she has any further concerns & anything such as trouble breathing or feeling light she if going to pass out, she needs to call 911.

## 2022-07-30 NOTE — Telephone Encounter (Signed)
Call pt  Noted Agree with triage advise Please sch f/u appt with me

## 2022-08-03 ENCOUNTER — Encounter: Payer: Self-pay | Admitting: Family Medicine

## 2022-08-03 NOTE — Telephone Encounter (Signed)
Pt has been scheduled with the next available provider on Thursday.

## 2022-08-05 ENCOUNTER — Ambulatory Visit: Payer: Medicare PPO | Admitting: Nurse Practitioner

## 2022-08-05 ENCOUNTER — Ambulatory Visit (INDEPENDENT_AMBULATORY_CARE_PROVIDER_SITE_OTHER): Payer: Medicare PPO

## 2022-08-05 VITALS — BP 139/86 | HR 85 | Temp 99.2°F | Ht 65.0 in | Wt 178.6 lb

## 2022-08-05 DIAGNOSIS — R42 Dizziness and giddiness: Secondary | ICD-10-CM

## 2022-08-05 DIAGNOSIS — R918 Other nonspecific abnormal finding of lung field: Secondary | ICD-10-CM | POA: Diagnosis not present

## 2022-08-05 DIAGNOSIS — R55 Syncope and collapse: Secondary | ICD-10-CM

## 2022-08-05 DIAGNOSIS — R059 Cough, unspecified: Secondary | ICD-10-CM | POA: Diagnosis not present

## 2022-08-05 DIAGNOSIS — R5383 Other fatigue: Secondary | ICD-10-CM | POA: Diagnosis not present

## 2022-08-05 DIAGNOSIS — J984 Other disorders of lung: Secondary | ICD-10-CM | POA: Diagnosis not present

## 2022-08-05 DIAGNOSIS — R9389 Abnormal findings on diagnostic imaging of other specified body structures: Secondary | ICD-10-CM

## 2022-08-05 NOTE — Assessment & Plan Note (Addendum)
Will check CMP, CBC and TSH today. Will contact patient with results.

## 2022-08-05 NOTE — Assessment & Plan Note (Addendum)
Will check lab work today. Orthostatic vitals reviewed and are reassuring. There is no hypotension noted with position changes. Encouraged patient to go to ED if symptoms are worsening or she passes out.

## 2022-08-05 NOTE — Assessment & Plan Note (Signed)
Patient had abnormal chest x-ray on 12/11, requested repeat in 3-4 weeks. Will repeat today since patient is here. Will contact her with results.

## 2022-08-05 NOTE — Progress Notes (Signed)
Tomasita Morrow, NP-C Phone: [phone]  Kathy Bryant is a 76 y.o. female who presents today for increased fatigue and dizziness x 3 weeks.   Patient reports being diagnosed with the flu approximately 2 weeks ago. She states her flu symptoms such as cough and congestion have resolved however the fatigue/weakness has remained. She reports her daughter is a Marine scientist and has given her 3 bags of fluids over the last 3 weeks with no relief in symptoms. She was previously advised to seek care in the ED however she did not go due to wait times. She denies any chest pain or shortness of breath.   Patient states she had a form of hepatitis when she was younger and states that the way she feels now is similar to how she felt then. She is requesting her liver enzymes be checked. She does see GI regularly for cirrhosis.   Social History   Tobacco Use  Smoking Status Every Day   Types: Cigarettes   Start date: 05/24/2019   Passive exposure: Never  Smokeless Tobacco Never  Tobacco Comments   "I WAS A CLOSET SMOKER AND NEVER SMOKED ALOT"    Current Outpatient Medications on File Prior to Visit  Medication Sig Dispense Refill   albuterol (VENTOLIN HFA) 108 (90 Base) MCG/ACT inhaler Inhale 1-2 puffs into the lungs every 6 (six) hours as needed for wheezing or shortness of breath. 8 g 0   ALPRAZolam (XANAX) 0.25 MG tablet Take 1 tablet (0.25 mg total) by mouth 2 (two) times daily as needed. for anxiety 30 tablet 1   cyanocobalamin (,VITAMIN B-12,) 1000 MCG/ML injection 1000 mcg (1 mL) intramuscular injection in the thigh ( vastus lateralis)  once per month. 1 mL 15   losartan (COZAAR) 25 MG tablet Take 0.5 tablets (12.5 mg total) by mouth daily. 90 tablet 1   pravastatin (PRAVACHOL) 20 MG tablet Take 1 tablet (20 mg total) by mouth once a week. 12 tablet 1   Semaglutide,0.25 or 0.'5MG'$ /DOS, (OZEMPIC, 0.25 OR 0.5 MG/DOSE,) 2 MG/1.5ML SOPN Inject 0.25 mg into the skin once a week. 3 mL 3   fluticasone  (FLONASE) 50 MCG/ACT nasal spray Place 1 spray into both nostrils 2 (two) times daily. 16 g 0   No current facility-administered medications on file prior to visit.    ROS see history of present illness  Objective  Physical Exam Vitals:   08/05/22 1519 08/05/22 1520  BP: 122/84 139/86  Pulse: 72 85  Temp:    SpO2:      BP Readings from Last 3 Encounters:  08/05/22 139/86  04/09/22 116/78  01/06/22 122/78   Wt Readings from Last 3 Encounters:  08/05/22 178 lb 9.6 oz (81 kg)  04/09/22 182 lb 6.4 oz (82.7 kg)  01/06/22 191 lb 9.6 oz (86.9 kg)    Physical Exam Constitutional:      General: She is not in acute distress.    Appearance: Normal appearance.  HENT:     Head: Normocephalic.     Right Ear: Tympanic membrane normal.     Left Ear: Tympanic membrane normal.     Nose: Nose normal. No congestion or rhinorrhea.     Mouth/Throat:     Mouth: Mucous membranes are moist.     Pharynx: Oropharynx is clear. No oropharyngeal exudate or posterior oropharyngeal erythema.  Eyes:     Pupils: Pupils are equal, round, and reactive to light.  Cardiovascular:     Rate and Rhythm: Normal rate and regular  rhythm.     Heart sounds: Normal heart sounds.  Pulmonary:     Effort: Pulmonary effort is normal. No respiratory distress.     Breath sounds: Rhonchi (left upper lobe) present.  Abdominal:     General: Abdomen is flat. Bowel sounds are normal. There is no distension.     Palpations: Abdomen is soft.     Tenderness: There is no abdominal tenderness.  Skin:    General: Skin is warm and dry.  Neurological:     General: No focal deficit present.     Mental Status: She is alert.  Psychiatric:        Mood and Affect: Mood normal.        Behavior: Behavior normal.    Assessment/Plan: Please see individual problem list.  Fatigue, unspecified type Assessment & Plan: Will check CMP, CBC and TSH today. Will contact patient with results.   Orders: -     CBC with  Differential/Platelet -     Comprehensive metabolic panel -     TSH  Postural dizziness with presyncope Assessment & Plan: Will check lab work today. Orthostatic vitals reviewed and are reassuring. There is no hypotension noted with position changes. Encouraged patient to go to ED if symptoms are worsening or she passes out.  Orders: -     CBC with Differential/Platelet -     Comprehensive metabolic panel  Abnormal chest x-ray Assessment & Plan: Patient had abnormal chest x-ray on 12/11, requested repeat in 3-4 weeks. Will repeat today since patient is here. Will contact her with results.     Return if symptoms worsen or fail to improve.   Tomasita Morrow, NP-C [place]

## 2022-08-06 LAB — COMPREHENSIVE METABOLIC PANEL
ALT: 38 U/L — ABNORMAL HIGH (ref 0–35)
AST: 17 U/L (ref 0–37)
Albumin: 3.6 g/dL (ref 3.5–5.2)
Alkaline Phosphatase: 57 U/L (ref 39–117)
BUN: 16 mg/dL (ref 6–23)
CO2: 24 mEq/L (ref 19–32)
Calcium: 8.8 mg/dL (ref 8.4–10.5)
Chloride: 106 mEq/L (ref 96–112)
Creatinine, Ser: 1.05 mg/dL (ref 0.40–1.20)
GFR: 52.14 mL/min — ABNORMAL LOW (ref >60.00)
Glucose, Bld: 79 mg/dL (ref 70–99)
Potassium: 3.9 mEq/L (ref 3.5–5.1)
Sodium: 140 mEq/L (ref 135–145)
Total Bilirubin: 0.5 mg/dL (ref 0.2–1.2)
Total Protein: 6.1 g/dL (ref 6.0–8.3)

## 2022-08-06 LAB — CBC WITH DIFFERENTIAL/PLATELET
Basophils Absolute: 0.1 10*3/uL (ref 0.0–0.1)
Basophils Relative: 0.6 % (ref 0.0–3.0)
Eosinophils Absolute: 0.1 10*3/uL (ref 0.0–0.7)
Eosinophils Relative: 0.5 % (ref 0.0–5.0)
HCT: 44.2 % (ref 36.0–46.0)
Hemoglobin: 14.9 g/dL (ref 12.0–15.0)
Lymphocytes Relative: 33.8 % (ref 12.0–46.0)
Lymphs Abs: 4.3 10*3/uL — ABNORMAL HIGH (ref 0.7–4.0)
MCHC: 33.8 g/dL (ref 30.0–36.0)
MCV: 95.1 fl (ref 78.0–100.0)
Monocytes Absolute: 0.8 10*3/uL (ref 0.1–1.0)
Monocytes Relative: 6.2 % (ref 3.0–12.0)
Neutro Abs: 7.5 10*3/uL (ref 1.4–7.7)
Neutrophils Relative %: 58.9 % (ref 43.0–77.0)
Platelets: 195 10*3/uL (ref 150.0–400.0)
RBC: 4.65 Mil/uL (ref 3.87–5.11)
RDW: 14.5 % (ref 11.5–15.5)
WBC: 12.8 10*3/uL — ABNORMAL HIGH (ref 4.0–10.5)

## 2022-08-06 LAB — TSH: TSH: 1.55 u[IU]/mL (ref 0.35–5.50)

## 2022-08-09 ENCOUNTER — Encounter: Payer: Self-pay | Admitting: Family Medicine

## 2022-08-09 NOTE — Progress Notes (Signed)
Letter sent with normal results.

## 2022-08-10 ENCOUNTER — Other Ambulatory Visit: Payer: Self-pay | Admitting: Nurse Practitioner

## 2022-08-10 DIAGNOSIS — R944 Abnormal results of kidney function studies: Secondary | ICD-10-CM

## 2022-08-10 DIAGNOSIS — D72829 Elevated white blood cell count, unspecified: Secondary | ICD-10-CM

## 2022-08-12 ENCOUNTER — Other Ambulatory Visit: Payer: Medicare PPO

## 2022-08-13 ENCOUNTER — Other Ambulatory Visit (INDEPENDENT_AMBULATORY_CARE_PROVIDER_SITE_OTHER): Payer: Medicare PPO

## 2022-08-13 ENCOUNTER — Telehealth: Payer: Self-pay

## 2022-08-13 DIAGNOSIS — D72829 Elevated white blood cell count, unspecified: Secondary | ICD-10-CM

## 2022-08-13 DIAGNOSIS — R944 Abnormal results of kidney function studies: Secondary | ICD-10-CM | POA: Diagnosis not present

## 2022-08-13 LAB — COMPREHENSIVE METABOLIC PANEL
ALT: 24 U/L (ref 0–35)
AST: 17 U/L (ref 0–37)
Albumin: 3.8 g/dL (ref 3.5–5.2)
Alkaline Phosphatase: 61 U/L (ref 39–117)
BUN: 17 mg/dL (ref 6–23)
CO2: 29 mEq/L (ref 19–32)
Calcium: 9.2 mg/dL (ref 8.4–10.5)
Chloride: 105 mEq/L (ref 96–112)
Creatinine, Ser: 0.92 mg/dL (ref 0.40–1.20)
GFR: 61.09 mL/min (ref >60.00)
Glucose, Bld: 147 mg/dL — ABNORMAL HIGH (ref 70–99)
Potassium: 4 mEq/L (ref 3.5–5.1)
Sodium: 142 mEq/L (ref 135–145)
Total Bilirubin: 0.4 mg/dL (ref 0.2–1.2)
Total Protein: 6.3 g/dL (ref 6.0–8.3)

## 2022-08-13 LAB — CBC WITH DIFFERENTIAL/PLATELET
Basophils Absolute: 0 10*3/uL (ref 0.0–0.1)
Basophils Relative: 0.5 % (ref 0.0–3.0)
Eosinophils Absolute: 0.1 10*3/uL (ref 0.0–0.7)
Eosinophils Relative: 1.1 % (ref 0.0–5.0)
HCT: 44.4 % (ref 36.0–46.0)
Hemoglobin: 14.9 g/dL (ref 12.0–15.0)
Lymphocytes Relative: 35.8 % (ref 12.0–46.0)
Lymphs Abs: 2.9 10*3/uL (ref 0.7–4.0)
MCHC: 33.4 g/dL (ref 30.0–36.0)
MCV: 95.6 fl (ref 78.0–100.0)
Monocytes Absolute: 0.5 10*3/uL (ref 0.1–1.0)
Monocytes Relative: 6.1 % (ref 3.0–12.0)
Neutro Abs: 4.5 10*3/uL (ref 1.4–7.7)
Neutrophils Relative %: 56.5 % (ref 43.0–77.0)
Platelets: 199 10*3/uL (ref 150.0–400.0)
RBC: 4.65 Mil/uL (ref 3.87–5.11)
RDW: 14.4 % (ref 11.5–15.5)
WBC: 8 10*3/uL (ref 4.0–10.5)

## 2022-08-13 NOTE — Telephone Encounter (Signed)
  Labs:  Tomasita Morrow, NP  Gracy Racer, CMA Her white count is back to normal and her kidney function is back to normal, her labs look much better!

## 2022-08-13 NOTE — Telephone Encounter (Signed)
LMOM for pt to CB in regards to labs 

## 2022-09-09 ENCOUNTER — Ambulatory Visit (INDEPENDENT_AMBULATORY_CARE_PROVIDER_SITE_OTHER): Payer: Medicare PPO

## 2022-09-09 VITALS — Ht 65.0 in | Wt 178.0 lb

## 2022-09-09 DIAGNOSIS — Z0000 Encounter for general adult medical examination without abnormal findings: Secondary | ICD-10-CM

## 2022-09-09 NOTE — Patient Instructions (Addendum)
[name] , Thank you for taking time to come for your Medicare Wellness Visit. I appreciate your ongoing commitment to your health goals. Please review the following plan we discussed and let me know if I can assist you in the future.   These are the goals we discussed:    Goals Addressed             This Visit's Progress    Increase physical activity       Walk more for exercise Stop smoking        This is a list of the screening recommended for you and due dates:  Health Maintenance  Topic Date Due   Complete foot exam   07/14/2022   COVID-19 Vaccine (3 - 2023-24 season) 09/25/2022*   Flu Shot  11/07/2022*   Zoster (Shingles) Vaccine (1 of 2) 12/08/2022*   Hemoglobin A1C  10/08/2022   Eye exam for diabetics  03/24/2023   Yearly kidney health urinalysis for diabetes  04/14/2023   Yearly kidney function blood test for diabetes  08/14/2023   Medicare Annual Wellness Visit  09/10/2023   DTaP/Tdap/Td vaccine (3 - Td or Tdap) 12/01/2023   Colon Cancer Screening  04/26/2024   Pneumonia Vaccine  Completed   DEXA scan (bone density measurement)  Completed   Hepatitis C Screening: USPSTF Recommendation to screen - Ages 79-79 yo.  Completed   HPV Vaccine  Aged Out  *Topic was postponed. The date shown is not the original due date.    Advanced directives: not yet completed.   Conditions/risks identified: none new.  Next appointment: Follow up in one year for your annual wellness visit    Preventive Care 65 Years and Older, Female Preventive care refers to lifestyle choices and visits with your health care provider that can promote health and wellness. What does preventive care include? A yearly physical exam. This is also called an annual well check. Dental exams once or twice a year. Routine eye exams. Ask your health care provider how often you should have your eyes checked. Personal lifestyle choices, including: Daily care of your teeth and gums. Regular physical  activity. Eating a healthy diet. Avoiding tobacco and drug use. Limiting alcohol use. Practicing safe sex. Taking low-dose aspirin every day. Taking vitamin and mineral supplements as recommended by your health care provider. What happens during an annual well check? The services and screenings done by your health care provider during your annual well check will depend on your age, overall health, lifestyle risk factors, and family history of disease. Counseling  Your health care provider may ask you questions about your: Alcohol use. Tobacco use. Drug use. Emotional well-being. Home and relationship well-being. Sexual activity. Eating habits. History of falls. Memory and ability to understand (cognition). Work and work Statistician. Reproductive health. Screening  You may have the following tests or measurements: Height, weight, and BMI. Blood pressure. Lipid and cholesterol levels. These may be checked every 5 years, or more frequently if you are over 11 years old. Skin check. Lung cancer screening. You may have this screening every year starting at age 49 if you have a 30-pack-year history of smoking and currently smoke or have quit within the past 15 years. Fecal occult blood test (FOBT) of the stool. You may have this test every year starting at age 51. Flexible sigmoidoscopy or colonoscopy. You may have a sigmoidoscopy every 5 years or a colonoscopy every 10 years starting at age 54. Hepatitis C blood test. Hepatitis B  blood test. Sexually transmitted disease (STD) testing. Diabetes screening. This is done by checking your blood sugar (glucose) after you have not eaten for a while (fasting). You may have this done every 1-3 years. Bone density scan. This is done to screen for osteoporosis. You may have this done starting at age 71. Mammogram. This may be done every 1-2 years. Talk to your health care provider about how often you should have regular mammograms. Talk with your  health care provider about your test results, treatment options, and if necessary, the need for more tests. Vaccines  Your health care provider may recommend certain vaccines, such as: Influenza vaccine. This is recommended every year. Tetanus, diphtheria, and acellular pertussis (Tdap, Td) vaccine. You may need a Td booster every 10 years. Zoster vaccine. You may need this after age 55. Pneumococcal 13-valent conjugate (PCV13) vaccine. One dose is recommended after age 72. Pneumococcal polysaccharide (PPSV23) vaccine. One dose is recommended after age 40. Talk to your health care provider about which screenings and vaccines you need and how often you need them. This information is not intended to replace advice given to you by your health care provider. Make sure you discuss any questions you have with your health care provider. Document Released: 08/22/2015 Document Revised: 04/14/2016 Document Reviewed: 05/27/2015 Elsevier Interactive Patient Education  2017 [employer] Prevention in the Home Falls can cause injuries. They can happen to people of all ages. There are many things you can do to make your home safe and to help prevent falls. What can I do on the outside of my home? Regularly fix the edges of walkways and driveways and fix any cracks. Remove anything that might make you trip as you walk through a door, such as a raised step or threshold. Trim any bushes or trees on the path to your home. Use bright outdoor lighting. Clear any walking paths of anything that might make someone trip, such as rocks or tools. Regularly check to see if handrails are loose or broken. Make sure that both sides of any steps have handrails. Any raised decks and porches should have guardrails on the edges. Have any leaves, snow, or ice cleared regularly. Use sand or salt on walking paths during winter. Clean up any spills in your garage right away. This includes oil or grease spills. What can I  do in the bathroom? Use night lights. Install grab bars by the toilet and in the tub and shower. Do not use towel bars as grab bars. Use non-skid mats or decals in the tub or shower. If you need to sit down in the shower, use a plastic, non-slip stool. Keep the floor dry. Clean up any water that spills on the floor as soon as it happens. Remove soap buildup in the tub or shower regularly. Attach bath mats securely with double-sided non-slip rug tape. Do not have throw rugs and other things on the floor that can make you trip. What can I do in the bedroom? Use night lights. Make sure that you have a light by your bed that is easy to reach. Do not use any sheets or blankets that are too big for your bed. They should not hang down onto the floor. Have a firm chair that has side arms. You can use this for support while you get dressed. Do not have throw rugs and other things on the floor that can make you trip. What can I do in the kitchen? Clean up any spills  right away. Avoid walking on wet floors. Keep items that you use a lot in easy-to-reach places. If you need to reach something above you, use a strong step stool that has a grab bar. Keep electrical cords out of the way. Do not use floor polish or wax that makes floors slippery. If you must use wax, use non-skid floor wax. Do not have throw rugs and other things on the floor that can make you trip. What can I do with my stairs? Do not leave any items on the stairs. Make sure that there are handrails on both sides of the stairs and use them. Fix handrails that are broken or loose. Make sure that handrails are as long as the stairways. Check any carpeting to make sure that it is firmly attached to the stairs. Fix any carpet that is loose or worn. Avoid having throw rugs at the top or bottom of the stairs. If you do have throw rugs, attach them to the floor with carpet tape. Make sure that you have a light switch at the top of the stairs  and the bottom of the stairs. If you do not have them, ask someone to add them for you. What else can I do to help prevent falls? Wear shoes that: Do not have high heels. Have rubber bottoms. Are comfortable and fit you well. Are closed at the toe. Do not wear sandals. If you use a stepladder: Make sure that it is fully opened. Do not climb a closed stepladder. Make sure that both sides of the stepladder are locked into place. Ask someone to hold it for you, if possible. Clearly mark and make sure that you can see: Any grab bars or handrails. First and last steps. Where the edge of each step is. Use tools that help you move around (mobility aids) if they are needed. These include: Canes. Walkers. Scooters. Crutches. Turn on the lights when you go into a dark area. Replace any light bulbs as soon as they burn out. Set up your furniture so you have a clear path. Avoid moving your furniture around. If any of your floors are uneven, fix them. If there are any pets around you, be aware of where they are. Review your medicines with your doctor. Some medicines can make you feel dizzy. This can increase your chance of falling. Ask your doctor what other things that you can do to help prevent falls. This information is not intended to replace advice given to you by your health care provider. Make sure you discuss any questions you have with your health care provider. Document Released: 05/22/2009 Document Revised: 01/01/2016 Document Reviewed: 08/30/2014 Elsevier Interactive Patient Education  2017 [employer].

## 2022-09-09 NOTE — Progress Notes (Signed)
Subjective:   Kathy Bryant is a 77 y.o. female who presents for Medicare Annual (Subsequent) preventive examination.  Review of Systems    No ROS.  Medicare Wellness Virtual Visit.  Visual/audio telehealth visit, UTA vital signs.   See social history for additional risk factors.   Cardiac Risk Factors include: advanced age (>61mn, >>22women)     Objective:    Today's Vitals   09/09/22 0923  Weight: 178 lb (80.7 kg)  Height: '5\' 5"'$  (1.651 m)   Body mass index is 29.62 kg/m.     09/09/2022    9:27 AM 08/27/2021    8:37 AM 06/02/2020    1:28 PM 04/27/2019   11:03 AM 03/07/2019   12:56 PM 12/23/2016   12:05 PM 12/22/2016   12:24 PM  Advanced Directives  Does Patient Have a Medical Advance Directive? No No No No No No No  Would patient like information on creating a medical advance directive? No - Patient declined No - Patient declined No - Patient declined No - Patient declined Yes (MAU/Ambulatory/Procedural Areas - Information given) No - Patient declined     Current Medications (verified) Outpatient Encounter Medications as of 09/09/2022  Medication Sig   albuterol (VENTOLIN HFA) 108 (90 Base) MCG/ACT inhaler Inhale 1-2 puffs into the lungs every 6 (six) hours as needed for wheezing or shortness of breath.   ALPRAZolam (XANAX) 0.25 MG tablet Take 1 tablet (0.25 mg total) by mouth 2 (two) times daily as needed. for anxiety   cyanocobalamin (,VITAMIN B-12,) 1000 MCG/ML injection 1000 mcg (1 mL) intramuscular injection in the thigh ( vastus lateralis)  once per month.   fluticasone (FLONASE) 50 MCG/ACT nasal spray Place 1 spray into both nostrils 2 (two) times daily.   losartan (COZAAR) 25 MG tablet Take 0.5 tablets (12.5 mg total) by mouth daily.   pravastatin (PRAVACHOL) 20 MG tablet Take 1 tablet (20 mg total) by mouth once a week.   Semaglutide,0.25 or 0.'5MG'$ /DOS, (OZEMPIC, 0.25 OR 0.5 MG/DOSE,) 2 MG/1.5ML SOPN Inject 0.25 mg into the skin once a week.   No  facility-administered encounter medications on file as of 09/09/2022.    Allergies (verified) Simvastatin   History: Past Medical History:  Diagnosis Date   Anxiety    Bilateral calf pain    Diabetes mellitus without complication (HCC)    PT STATES SHE WAS ON METFORMIN AND STOPPED TAKING THIS ON HER OWN   Fatty liver    Hepatitis 1960'S   B   Herniated disc, cervical    HTN (hypertension)    PT WAS ON BP MEDS AND THEN STATES HER BP WAS UNDER CONTROL AND SHE STOPPED TAKING BP ON HER OWN   Microscopic hematuria    Obesity    Swelling of both lower extremities    Past Surgical History:  Procedure Laterality Date   APPENDECTOMY  1968   BREAST BIOPSY Right 2013   x2 BENIGN BREAST TISSUE WITH FOCAL HYALINIZED STROMA AND BENIGN BREAST TISSUE WITH FOCAL FIBROADENOMATOUS CHANGE    CESAREAN SECTION  1972 & 1978   CHOLECYSTECTOMY     COLONOSCOPY  03/05/2014   COLONOSCOPY WITH PROPOFOL N/A 04/27/2019   Procedure: COLONOSCOPY WITH PROPOFOL;  Surgeon: WLucilla Lame MD;  [facility]  Service: Endoscopy;  Laterality: N/A;   ESOPHAGOGASTRODUODENOSCOPY (EGD) WITH PROPOFOL N/A 04/27/2019   Procedure: ESOPHAGOGASTRODUODENOSCOPY (EGD) WITH PROPOFOL;  Surgeon: WLucilla Lame MD;  [facility]  Service: Endoscopy;  Laterality: N/A;   GALLBLADDER  SURGERY  1982   GASTRIC RESTRICTION SURGERY  1980   'stomach stapled' with banding   HEMORRHOID SURGERY N/A 12/23/2016   Procedure: HEMORRHOIDECTOMY;  Surgeon: Christene Lye, MD;  Location: [facility];  Service: General;  Laterality: N/A;   HERNIA REPAIR     Abdominal-ventral   POLYPECTOMY N/A 04/27/2019   Procedure: POLYPECTOMY;  Surgeon: Lucilla Lame, MD;  Location: [place] [facility];  Service: Endoscopy;  Laterality: N/A;   TOTAL ABDOMINAL HYSTERECTOMY W/ BILATERAL SALPINGOOPHORECTOMY  1998   Family History  Problem Relation Age of Onset   Diabetes Mother    Cancer Mother 61       Brain Tumor - died age  37   Heart disease Father        CAD - died MI - age 26   Hypertension Father    High Cholesterol Father    Diabetes Sister    Diabetes Brother    Diabetes Brother    Cancer Paternal Aunt        Renal Cell Ca   Cancer Paternal Uncle        Renal Cell Ca   Breast cancer Neg Hx    Thyroid cancer Neg Hx    Social History   Socioeconomic History   Marital status: Married    Spouse name: Not on file   Number of children: 2   Years of education: 13   Highest education level: Not on file  Occupational History   Occupation: Sports administrator: [employer]   Tobacco Use   Smoking status: Every Day    Packs/day: 1.00    Types: Cigarettes    Start date: 05/24/2019    Passive exposure: Never   Smokeless tobacco: Never   Tobacco comments:    "I WAS A CLOSET SMOKER AND NEVER SMOKED ALOT"  Vaping Use   Vaping Use: Never used  Substance and Sexual Activity   Alcohol use: Yes    Comment: Occasional glass of wine   Drug use: No   Sexual activity: Not on file  Other Topics Concern   Not on file  Social History Narrative   Not on file   Social Determinants of Health   Financial Resource Strain: Low Risk  (09/09/2022)   Overall Financial Resource Strain (CARDIA)    Difficulty of Paying Living Expenses: Not hard at all  Food Insecurity: No Food Insecurity (09/09/2022)   Hunger Vital Sign    Worried About Running Out of Food in the Last Year: Never true    Ran Out of Food in the Last Year: Never true  Transportation Needs: No Transportation Needs (09/09/2022)   PRAPARE - Hydrologist (Medical): No    Lack of Transportation (Non-Medical): No  Physical Activity: Unknown (09/09/2022)   Exercise Vital Sign    Days of Exercise per Week: 0 days    Minutes of Exercise per Session: Not on file  Stress: No Stress Concern Present (09/09/2022)   [place]    Feeling of Stress : Only a  little  Social Connections: Unknown (09/09/2022)   Social Connection and Isolation Panel [NHANES]    Frequency of Communication with Friends and Family: More than three times a week    Frequency of Social Gatherings with Friends and Family: More than three times a week    Attends Religious Services: Not on file    Active Member of Clubs or Organizations: Not  on file    Attends Archivist Meetings: Not on file    Marital Status: Not on file    Tobacco Counseling Ready to quit: Not Answered Counseling given: Not Answered Tobacco comments: "I WAS A CLOSET SMOKER AND NEVER SMOKED ALOT"   Clinical Intake:  Pre-visit preparation completed: Yes        Diabetes: Yes (Followed by PCP)  How often do you need to have someone help you when you read instructions, pamphlets, or other written materials from your doctor or pharmacy?: 1 - Never    Interpreter Needed?: No      Activities of Daily Living    09/09/2022    9:35 AM  In your present state of health, do you have any difficulty performing the following activities:  Hearing? 0  Vision? 0  Difficulty concentrating or making decisions? 0  Walking or climbing stairs? 0  Dressing or bathing? 0  Doing errands, shopping? 0  Preparing Food and eating ? N  Using the Toilet? N  In the past six months, have you accidently leaked urine? N  Do you have problems with loss of bowel control? N  Managing your Medications? N  Managing your Finances? N  Housekeeping or managing your Housekeeping? N    Patient Care Team: Burnard Hawthorne, FNP as PCP - General (Family Medicine)  Indicate any recent Medical Services you may have received from other than [facility] providers in the past year (date may be approximate).     Assessment:   This is a routine wellness examination for Nayleen.  I connected with  Quantisha R Spickler on 09/09/22 by a audio enabled telemedicine application and verified that I am speaking with the correct  person using two identifiers.  Patient Location: Home  Provider Location: Office/Clinic  I discussed the limitations of evaluation and management by telemedicine. The patient expressed understanding and agreed to proceed.   Hearing/Vision screen Hearing Screening - Comments:: Patient is able to hear conversational tones without difficulty.  No issues reported.   Vision Screening - Comments:: Followed by Dr. Corrin Parker Cataracts extracted, bilateral They have seen their ophthalmologist in the last 12-24 months.  Dietary issues and exercise activities discussed: Current Exercise Habits: Home exercise routine, Type of exercise: walking, Intensity: Mild   Goals Addressed             This Visit's Progress    Increase physical activity       Walk more for exercise Stop smoking       Depression Screen    09/09/2022    9:25 AM 07/19/2022    1:12 PM 04/09/2022    9:47 AM 01/06/2022   10:44 AM 10/13/2021    9:41 AM 08/27/2021    8:36 AM 09/25/2020   11:50 AM  PHQ 2/9 Scores  PHQ - 2 Score 0 0 0 0  0 0  PHQ- 9 Score   0      Exception Documentation     Patient refusal      Fall Risk    09/09/2022    9:29 AM 07/19/2022    1:12 PM 04/09/2022    9:46 AM 01/06/2022   10:43 AM 09/28/2021   11:11 AM  Fall Risk   Falls in the past year? 0 0 0 0 0  Number falls in past yr: 0 0 0 0 0  Injury with Fall? 0 0 0 0 0  Risk for fall due to : No Fall Risks No Fall Risks  No Fall Risks No Fall Risks No Fall Risks  Follow up Falls evaluation completed;Falls prevention discussed Falls evaluation completed Falls evaluation completed Falls evaluation completed Falls evaluation completed    FALL RISK PREVENTION PERTAINING TO THE HOME: Home free of loose throw rugs in walkways, pet beds, electrical cords, etc? Yes  Adequate lighting in your home to reduce risk of falls? Yes   ASSISTIVE DEVICES UTILIZED TO PREVENT FALLS: Life alert? No  Use of a cane, walker or w/c? No  Grab bar in the bathroom? Yes   Shower chair or bench in shower? Yes  Comfort chair height toilet? Yes   TIMED UP AND GO: Was the test performed? No .   Cognitive Function:        09/09/2022    9:35 AM 08/27/2021    8:39 AM 03/07/2019   12:56 PM  6CIT Screen  What Year? 0 points 0 points 0 points  What month? 0 points 0 points 0 points  What time? 0 points 0 points 0 points  Count back from 20   0 points  Months in reverse   0 points    Immunizations Immunization History  Administered Date(s) Administered   Hepb-cpg 08/18/2018, 09/20/2018   Influenza-Unspecified 05/05/2011   PFIZER(Purple Top)SARS-COV-2 Vaccination 10/05/2019, 10/31/2019   Pneumococcal Conjugate-13 11/30/2013   Pneumococcal Polysaccharide-23 05/12/2018   Td 11/30/2013   Tdap 05/05/2011   Zoster, Live 06/27/2012   Covid-19 vaccine status: Completed vaccines x2  Screening Tests Health Maintenance  Topic Date Due   FOOT EXAM  07/14/2022   COVID-19 Vaccine (3 - 2023-24 season) 09/25/2022 (Originally 04/09/2022)   INFLUENZA VACCINE  11/07/2022 (Originally 03/09/2022)   Zoster Vaccines- Shingrix (1 of 2) 12/08/2022 (Originally 07/11/1997)   HEMOGLOBIN A1C  10/08/2022   OPHTHALMOLOGY EXAM  03/24/2023   Diabetic kidney evaluation - Urine ACR  04/14/2023   Diabetic kidney evaluation - eGFR measurement  08/14/2023   Medicare Annual Wellness (AWV)  09/10/2023   DTaP/Tdap/Td (3 - Td or Tdap) 12/01/2023   COLONOSCOPY (Pts 45-50yr Insurance coverage will need to be confirmed)  04/26/2024   Pneumonia Vaccine 77 Years old  Completed   DEXA SCAN  Completed   Hepatitis C Screening  Completed   HPV VACCINES  Aged Out    Health Maintenance Health Maintenance Due  Topic Date Due   FOOT EXAM  07/14/2022   DG Chest 2 View: completed 08/08/22.  Hepatitis C Screening: Completed 2019.  Vision Screening: Recommended annual ophthalmology exams for early detection of glaucoma and other disorders of the eye.  Dental Screening: Recommended annual  dental exams for proper oral hygiene  Community Resource Referral / Chronic Care Management: CRR required this visit?  No   CCM required this visit?  No      Plan:     I have personally reviewed and noted the following in the patient's chart:   Medical and social history Use of alcohol, tobacco or illicit drugs  Current medications and supplements including opioid prescriptions. Patient is not currently taking opioid prescriptions. Functional ability and status Nutritional status Physical activity Advanced directives List of other physicians Hospitalizations, surgeries, and ER visits in previous 12 months Vitals Screenings to include cognitive, depression, and falls Referrals and appointments  In addition, I have reviewed and discussed with patient certain preventive protocols, quality metrics, and best practice recommendations. A written personalized care plan for preventive services as well as general preventive health recommendations were provided to patient.     Christien Berthelot L  Lianne Cure, LPN   3/0/1720

## 2023-01-25 ENCOUNTER — Other Ambulatory Visit: Payer: Self-pay | Admitting: Family

## 2023-01-25 DIAGNOSIS — F419 Anxiety disorder, unspecified: Secondary | ICD-10-CM

## 2023-02-07 ENCOUNTER — Other Ambulatory Visit: Payer: Self-pay

## 2023-02-07 ENCOUNTER — Encounter: Payer: Self-pay | Admitting: Gastroenterology

## 2023-02-07 DIAGNOSIS — K7460 Unspecified cirrhosis of liver: Secondary | ICD-10-CM

## 2023-03-01 ENCOUNTER — Ambulatory Visit
Admission: RE | Admit: 2023-03-01 | Discharge: 2023-03-01 | Disposition: A | Discharge status: Home / Self Care | Payer: Medicare PPO | Source: Ambulatory Visit | Attending: Gastroenterology | Admitting: Gastroenterology

## 2023-03-01 DIAGNOSIS — I700 Atherosclerosis of aorta: Secondary | ICD-10-CM | POA: Diagnosis not present

## 2023-03-01 DIAGNOSIS — K7460 Unspecified cirrhosis of liver: Secondary | ICD-10-CM

## 2023-03-01 DIAGNOSIS — K869 Disease of pancreas, unspecified: Secondary | ICD-10-CM | POA: Diagnosis not present

## 2023-03-01 MED ORDER — GADOPICLENOL 0.5 MMOL/ML IV SOLN
10.0000 mL | Freq: Once | INTRAVENOUS | Status: AC | PRN
  Administered 2023-03-01: 10 mL via INTRAVENOUS

## 2023-03-14 ENCOUNTER — Other Ambulatory Visit: Payer: Self-pay | Admitting: Family

## 2023-03-14 ENCOUNTER — Encounter: Payer: Self-pay | Admitting: Gastroenterology

## 2023-03-14 DIAGNOSIS — E1165 Type 2 diabetes mellitus with hyperglycemia: Secondary | ICD-10-CM

## 2023-06-24 ENCOUNTER — Other Ambulatory Visit: Payer: Self-pay | Admitting: *Deleted

## 2023-06-24 ENCOUNTER — Encounter: Payer: Self-pay | Admitting: *Deleted

## 2023-06-24 DIAGNOSIS — E118 Type 2 diabetes mellitus with unspecified complications: Secondary | ICD-10-CM

## 2023-09-02 NOTE — Progress Notes (Unsigned)
   Rubin Payor, PhD, LAT, ATC acting as a scribe for Clementeen Graham, MD.  ERYKA DOLINGER is a 78 y.o. female who presents to [facility] today for R buttock pain x ***. Pt locates pain to ***  Radiates: Aggravates: Treatments tried:  Pertinent review of systems: ***  Relevant historical information: ***   Exam:  There were no vitals taken for this visit. General: Well Developed, well nourished, and in no acute distress.   MSK: ***    Lab and Radiology Results No results found for this or any previous visit (from the past 72 hours). No results found.     Assessment and Plan: 78 y.o. female with ***   PDMP not reviewed this encounter. No orders of the defined types were placed in this encounter.  No orders of the defined types were placed in this encounter.    Discussed warning signs or symptoms. Please see discharge instructions. Patient expresses understanding.   ***

## 2023-09-05 ENCOUNTER — Ambulatory Visit: Payer: Medicare PPO | Admitting: Family Medicine

## 2023-09-05 VITALS — BP 122/80 | HR 68 | Ht 65.0 in | Wt 186.0 lb

## 2023-09-05 DIAGNOSIS — M7918 Myalgia, other site: Secondary | ICD-10-CM

## 2023-09-05 MED ORDER — PREDNISONE 50 MG PO TABS: ORAL_TABLET | ORAL | 0 refills | Status: DC

## 2023-09-05 NOTE — Patient Instructions (Addendum)
Thank you for coming in today.   I've sent a prescription for Prednisone to your pharmacy.   I've referred you to Physical Therapy.  Let us know if you don't hear from them in one week.  Ischial tuberosity off-loading cushion  Check back in 6 weeks

## 2023-09-07 ENCOUNTER — Ambulatory Visit: Payer: Medicare PPO | Admitting: Physical Therapy

## 2023-09-07 ENCOUNTER — Telehealth: Payer: Self-pay | Admitting: Physical Therapy

## 2023-09-07 ENCOUNTER — Other Ambulatory Visit: Payer: Self-pay | Admitting: Family

## 2023-09-07 DIAGNOSIS — E1165 Type 2 diabetes mellitus with hyperglycemia: Secondary | ICD-10-CM

## 2023-09-07 NOTE — Telephone Encounter (Signed)
Pt reports that she went to main hospital instead of outpatient address and she was unable to make it with enough time to complete the eval. She has rescheduled eval to next visit.

## 2023-09-12 ENCOUNTER — Ambulatory Visit: Payer: Medicare PPO | Attending: Family Medicine | Admitting: Physical Therapy

## 2023-09-12 ENCOUNTER — Encounter: Payer: Self-pay | Admitting: Physical Therapy

## 2023-09-12 ENCOUNTER — Ambulatory Visit (INDEPENDENT_AMBULATORY_CARE_PROVIDER_SITE_OTHER): Payer: Medicare PPO | Admitting: *Deleted

## 2023-09-12 VITALS — Ht 63.0 in | Wt 182.0 lb

## 2023-09-12 DIAGNOSIS — Z0000 Encounter for general adult medical examination without abnormal findings: Secondary | ICD-10-CM | POA: Diagnosis not present

## 2023-09-12 DIAGNOSIS — M25551 Pain in right hip: Secondary | ICD-10-CM | POA: Diagnosis not present

## 2023-09-12 NOTE — Progress Notes (Signed)
Subjective:   Kathy Bryant is a 78 y.o. female who presents for Medicare Annual (Subsequent) preventive examination.  Visit Complete: Virtual I connected with  Shabre R Snider on 09/12/23 by a audio enabled telemedicine application and verified that I am speaking with the correct person using two identifiers. This patient declined Interactive audio and Acupuncturist. Therefore the visit was completed with audio only.    Patient Location: Other:  in car  Provider Location: Office/Clinic  I discussed the limitations of evaluation and management by telemedicine. The patient expressed understanding and agreed to proceed.  Vital Signs: Because this visit was a virtual/telehealth visit, some criteria may be missing or patient reported. Any vitals not documented were not able to be obtained and vitals that have been documented are patient reported.   Cardiac Risk Factors include: advanced age (>38men, >30 women);diabetes mellitus;dyslipidemia;obesity (BMI >30kg/m2)     Objective:    Today's Vitals   09/12/23 0924 09/12/23 0925  Weight: 182 lb (82.6 kg)   Height: 5\' 3"  (1.6 m)   PainSc:  5    Body mass index is 32.24 kg/m.     09/12/2023    9:33 AM 09/09/2022    9:27 AM 08/27/2021    8:37 AM 06/02/2020    1:28 PM 04/27/2019   11:03 AM 03/07/2019   12:56 PM 12/23/2016   12:05 PM  Advanced Directives  Does Patient Have a Medical Advance Directive? No No No No No No No  Would patient like information on creating a medical advance directive? No - Patient declined No - Patient declined No - Patient declined No - Patient declined No - Patient declined Yes (MAU/Ambulatory/Procedural Areas - Information given) No - Patient declined    Current Medications (verified) Outpatient Encounter Medications as of 09/12/2023  Medication Sig   albuterol (VENTOLIN HFA) 108 (90 Base) MCG/ACT inhaler Inhale 1-2 puffs into the lungs every 6 (six) hours as needed for wheezing or shortness of  breath.   ALPRAZolam (XANAX) 0.25 MG tablet Take 1 tablet by mouth twice daily as needed for anxiety   OZEMPIC, 0.25 OR 0.5 MG/DOSE, 2 MG/3ML SOPN INJECT 0.25MG  INTO THE SKIN ONCE A WEEK   cyanocobalamin (,VITAMIN B-12,) 1000 MCG/ML injection 1000 mcg (1 mL) intramuscular injection in the thigh ( vastus lateralis)  once per month. (Patient not taking: Reported on 09/12/2023)   fluticasone (FLONASE) 50 MCG/ACT nasal spray Place 1 spray into both nostrils 2 (two) times daily. (Patient not taking: Reported on 09/12/2023)   losartan (COZAAR) 25 MG tablet Take 0.5 tablets (12.5 mg total) by mouth daily. (Patient not taking: Reported on 09/12/2023)   pravastatin (PRAVACHOL) 20 MG tablet Take 1 tablet (20 mg total) by mouth once a week. (Patient not taking: Reported on 09/12/2023)   [DISCONTINUED] predniSONE (DELTASONE) 50 MG tablet Take 1 pill daily for 5 days (Patient not taking: Reported on 09/12/2023)   No facility-administered encounter medications on file as of 09/12/2023.    Allergies (verified) Simvastatin   History: Past Medical History:  Diagnosis Date   Anxiety    Bilateral calf pain    Diabetes mellitus without complication (HCC)    PT STATES SHE WAS ON METFORMIN AND STOPPED TAKING THIS ON HER OWN   Fatty liver    Hepatitis 1960'S   B   Herniated disc, cervical    HTN (hypertension)    PT WAS ON BP MEDS AND THEN STATES HER BP WAS UNDER CONTROL AND SHE STOPPED TAKING BP ON  HER OWN   Microscopic hematuria    Obesity    Swelling of both lower extremities    Past Surgical History:  Procedure Laterality Date   APPENDECTOMY  1968   BREAST BIOPSY Right 2013   x2 BENIGN BREAST TISSUE WITH FOCAL HYALINIZED STROMA AND BENIGN BREAST TISSUE WITH FOCAL FIBROADENOMATOUS CHANGE    CESAREAN SECTION  1972 & 1978   CHOLECYSTECTOMY     COLONOSCOPY  03/05/2014   COLONOSCOPY WITH PROPOFOL N/A 04/27/2019   Procedure: COLONOSCOPY WITH PROPOFOL;  Surgeon: Midge Minium, MD;  Location: [facility];   Service: Endoscopy;  Laterality: N/A;   ESOPHAGOGASTRODUODENOSCOPY (EGD) WITH PROPOFOL N/A 04/27/2019   Procedure: ESOPHAGOGASTRODUODENOSCOPY (EGD) WITH PROPOFOL;  Surgeon: Midge Minium, MD;  Location: [facility];  Service: Endoscopy;  Laterality: N/A;   GALLBLADDER SURGERY  1982   GASTRIC RESTRICTION SURGERY  1980   'stomach stapled' with banding   HEMORRHOID SURGERY N/A 12/23/2016   Procedure: HEMORRHOIDECTOMY;  Surgeon: Kieth Brightly, MD;  Location: [facility];  Service: General;  Laterality: N/A;   HERNIA REPAIR     Abdominal-ventral   POLYPECTOMY N/A 04/27/2019   Procedure: POLYPECTOMY;  Surgeon: Midge Minium, MD;  Location: [facility];  Service: Endoscopy;  Laterality: N/A;   TOTAL ABDOMINAL HYSTERECTOMY W/ BILATERAL SALPINGOOPHORECTOMY  1998   Family History  Problem Relation Age of Onset   Diabetes Mother    Cancer Mother 39       Brain Tumor - died age 12   Heart disease Father        CAD - died MI - age 71   Hypertension Father    High Cholesterol Father    Diabetes Sister    Diabetes Brother    Diabetes Brother    Cancer Paternal Aunt        Renal Cell Ca   Cancer Paternal Uncle        Renal Cell Ca   Breast cancer Neg Hx    Thyroid cancer Neg Hx    Social History   Socioeconomic History   Marital status: Married    Spouse name: Not on file   Number of children: 2   Years of education: 13   Highest education level: Not on file  Occupational History   Occupation: Science writer: [employer]   Tobacco Use   Smoking status: Every Day    Current packs/day: 1.00    Average packs/day: 1 pack/day for 4.3 years (4.3 ttl pk-yrs)    Types: Cigarettes    Start date: 05/24/2019    Passive exposure: Never   Smokeless tobacco: Never   Tobacco comments:    "I WAS A CLOSET SMOKER AND NEVER SMOKED ALOT"  Vaping Use   Vaping status: Never Used  Substance and Sexual Activity   Alcohol use: Yes    Comment: Occasional glass  of wine   Drug use: No   Sexual activity: Not on file  Other Topics Concern   Not on file  Social History Narrative   Not on file   Social Drivers of Health   Financial Resource Strain: Low Risk  (09/12/2023)   Overall Financial Resource Strain (CARDIA)    Difficulty of Paying Living Expenses: Not hard at all  Food Insecurity: No Food Insecurity (09/12/2023)   Hunger Vital Sign    Worried About Running Out of Food in the Last Year: Never true    Ran Out of Food in the Last Year: Never  true  Transportation Needs: No Transportation Needs (09/12/2023)   PRAPARE - Administrator, Civil Service (Medical): No    Lack of Transportation (Non-Medical): No  Physical Activity: Inactive (09/12/2023)   Exercise Vital Sign    Days of Exercise per Week: 0 days    Minutes of Exercise per Session: 0 min  Stress: No Stress Concern Present (09/12/2023)   [employer] of Occupational Health - Occupational Stress Questionnaire    Feeling of Stress : Only a little  Social Connections: Moderately Integrated (09/12/2023)   Social Connection and Isolation Panel [NHANES]    Frequency of Communication with Friends and Family: More than three times a week    Frequency of Social Gatherings with Friends and Family: More than three times a week    Attends Religious Services: More than 4 times per year    Active Member of [employer] or Organizations: No    Attends Engineer, structural: Never    Marital Status: Married    Tobacco Counseling Ready to quit: Not Answered Counseling given: Not Answered Tobacco comments: "I WAS A CLOSET SMOKER AND NEVER SMOKED ALOT"   Clinical Intake:  Pre-visit preparation completed: Yes  Pain : 0-10 Pain Score: 5  Pain Type: Chronic pain Pain Location: Buttocks Pain Onset: More than a month ago Pain Frequency: Constant     Nutritional Status: BMI > 30  Obese Nutritional Risks: None Diabetes: Yes CBG done?: No Did pt. bring in CBG monitor from home?:  No  How often do you need to have someone help you when you read instructions, pamphlets, or other written materials from your doctor or pharmacy?: 1 - Never  Interpreter Needed?: No  Information entered by :: R. Errin Chewning LPN   Activities of Daily Living    09/12/2023    9:26 AM  In your present state of health, do you have any difficulty performing the following activities:  Hearing? 0  Vision? 0  Difficulty concentrating or making decisions? 0  Walking or climbing stairs? 1  Dressing or bathing? 0  Doing errands, shopping? 0  Preparing Food and eating ? N  Using the Toilet? N  In the past six months, have you accidently leaked urine? N  Do you have problems with loss of bowel control? N  Managing your Medications? N  Managing your Finances? N  Housekeeping or managing your Housekeeping? N    Patient Care Team: Allegra Grana, FNP as PCP - General (Family Medicine)  Indicate any recent Medical Services you may have received from other than [facility] providers in the past year (date may be approximate).     Assessment:   This is a routine wellness examination for Kathy Bryant.  Hearing/Vision screen Hearing Screening - Comments:: No issues Vision Screening - Comments:: readers   Goals Addressed             This Visit's Progress    Patient Stated       Wants to start back walking       Depression Screen    09/12/2023    9:30 AM 09/09/2022    9:25 AM 07/19/2022    1:12 PM 04/09/2022    9:47 AM 01/06/2022   10:44 AM 10/13/2021    9:41 AM 08/27/2021    8:36 AM  PHQ 2/9 Scores  PHQ - 2 Score 0 0 0 0 0  0  PHQ- 9 Score 0   0     Exception Documentation  Patient refusal     Fall Risk    09/12/2023    9:27 AM 09/09/2022    9:29 AM 07/19/2022    1:12 PM 04/09/2022    9:46 AM 01/06/2022   10:43 AM  Fall Risk   Falls in the past year? 0 0 0 0 0  Number falls in past yr: 0 0 0 0 0  Injury with Fall? 0 0 0 0 0  Risk for fall due to : No Fall Risks No Fall Risks No Fall  Risks No Fall Risks No Fall Risks  Follow up Falls prevention discussed;Falls evaluation completed Falls evaluation completed;Falls prevention discussed Falls evaluation completed Falls evaluation completed Falls evaluation completed    MEDICARE RISK AT HOME: Medicare Risk at Home Any stairs in or around the home?: Yes If so, are there any without handrails?: No Home free of loose throw rugs in walkways, pet beds, electrical cords, etc?: No Adequate lighting in your home to reduce risk of falls?: Yes Life alert?: No Use of a cane, walker or w/c?: No Grab bars in the bathroom?: Yes Shower chair or bench in shower?: Yes Elevated toilet seat or a handicapped toilet?: Yes   Cognitive Function:        09/12/2023    9:34 AM 09/09/2022    9:35 AM 08/27/2021    8:39 AM 03/07/2019   12:56 PM  6CIT Screen  What Year? 0 points 0 points 0 points 0 points  What month? 0 points 0 points 0 points 0 points  What time? 0 points 0 points 0 points 0 points  Count back from 20 0 points   0 points  Months in reverse 0 points   0 points  Repeat phrase 0 points     Total Score 0 points       Immunizations Immunization History  Administered Date(s) Administered   Hepb-cpg 08/18/2018, 09/20/2018   Influenza-Unspecified 05/05/2011   PFIZER(Purple Top)SARS-COV-2 Vaccination 10/05/2019, 10/31/2019   Pneumococcal Conjugate-13 11/30/2013   Pneumococcal Polysaccharide-23 05/12/2018   Td 11/30/2013   Tdap 05/05/2011   Zoster, Live 06/27/2012    TDAP status: Up to date  Flu Vaccine status: Due, Education has been provided regarding the importance of this vaccine. Advised may receive this vaccine at local pharmacy or Health Dept. Aware to provide a copy of the vaccination record if obtained from local pharmacy or Health Dept. Verbalized acceptance and understanding.  Pneumococcal vaccine status: Up to date  Covid-19 vaccine status: Information provided on how to obtain vaccines.   Qualifies for  Shingles Vaccine? Yes   Zostavax completed No   Shingrix Completed?: No.    Education has been provided regarding the importance of this vaccine. Patient has been advised to call insurance company to determine out of pocket expense if they have not yet received this vaccine. Advised may also receive vaccine at local pharmacy or Health Dept. Verbalized acceptance and understanding.  Screening Tests Health Maintenance  Topic Date Due   Zoster Vaccines- Shingrix (1 of 2) 07/11/1997   FOOT EXAM  07/14/2022   HEMOGLOBIN A1C  10/08/2022   OPHTHALMOLOGY EXAM  03/24/2023   COVID-19 Vaccine (3 - 2024-25 season) 04/10/2023   Diabetic kidney evaluation - Urine ACR  04/14/2023   Diabetic kidney evaluation - eGFR measurement  08/14/2023   Medicare Annual Wellness (AWV)  09/10/2023   INFLUENZA VACCINE  11/07/2023 (Originally 03/10/2023)   DTaP/Tdap/Td (3 - Td or Tdap) 12/01/2023   Colonoscopy  04/26/2024   Pneumonia Vaccine  64+ Years old  Completed   DEXA SCAN  Completed   Hepatitis C Screening  Completed   HPV VACCINES  Aged Out    Health Maintenance  Health Maintenance Due  Topic Date Due   Zoster Vaccines- Shingrix (1 of 2) 07/11/1997   FOOT EXAM  07/14/2022   HEMOGLOBIN A1C  10/08/2022   OPHTHALMOLOGY EXAM  03/24/2023   COVID-19 Vaccine (3 - 2024-25 season) 04/10/2023   Diabetic kidney evaluation - Urine ACR  04/14/2023   Diabetic kidney evaluation - eGFR measurement  08/14/2023   Medicare Annual Wellness (AWV)  09/10/2023    Colorectal cancer screening: Type of screening: Colonoscopy. Completed 04/2019. Repeat every 5 years  Mammogram status: Completed 07/2021. Repeat every year  Bone Density status: Completed 07/2020. Results reflect: Bone density results: NORMAL. Repeat every 2 years.  Lung Cancer Screening: (Low Dose CT Chest recommended if Age 60-80 years, 20 pack-year currently smoking OR have quit w/in 15years.) does not qualify.    Additional Screening:  Hepatitis C  Screening: does qualify; Completed 06/2018  Vision Screening: Recommended annual ophthalmology exams for early detection of glaucoma and other disorders of the eye. Is the patient up to date with their annual eye exam?  No  Who is the provider or what is the name of the office in which the patient attends annual eye exams? Eye doctor retired and has not set up with another one yet If pt is not established with a provider, would they like to be referred to a provider to establish care? No .   Dental Screening: Recommended annual dental exams for proper oral hygiene  Diabetic Foot Exam: Diabetic Foot Exam: Overdue, Pt has been advised about the importance in completing this exam. Pt is scheduled for diabetic foot exam on 09/14/23..  Community Resource Referral / Chronic Care Management: CRR required this visit?  No   CCM required this visit?  No     Plan:     I have personally reviewed and noted the following in the patient's chart:   Medical and social history Use of alcohol, tobacco or illicit drugs  Current medications and supplements including opioid prescriptions. Patient is not currently taking opioid prescriptions. Functional ability and status Nutritional status Physical activity Advanced directives List of other physicians Hospitalizations, surgeries, and ER visits in previous 12 months Vitals Screenings to include cognitive, depression, and falls Referrals and appointments  In addition, I have reviewed and discussed with patient certain preventive protocols, quality metrics, and best practice recommendations. A written personalized care plan for preventive services as well as general preventive health recommendations were provided to patient.     Sydell Axon, LPN   4/0/9811   After Visit Summary: (MyChart) Due to this being a telephonic visit, the after visit summary with patients personalized plan was offered to patient via MyChart   Nurse Notes: see routing  notes

## 2023-09-12 NOTE — Patient Instructions (Signed)
Kathy Bryant , Thank you for taking time to come for your Medicare Wellness Visit. I appreciate your ongoing commitment to your health goals. Please review the following plan we discussed and let me know if I can assist you in the future.   Referrals/Orders/Follow-Ups/Clinician Recommendations: Please remember to update your vaccines and call to schedule an eye exam.  This is a list of the screening recommended for you and due dates:  Health Maintenance  Topic Date Due   Zoster (Shingles) Vaccine (1 of 2) 07/11/1997   Complete foot exam   07/14/2022   Mammogram  07/22/2022   Hemoglobin A1C  10/08/2022   Eye exam for diabetics  03/24/2023   COVID-19 Vaccine (3 - 2024-25 season) 04/10/2023   Yearly kidney health urinalysis for diabetes  04/14/2023   Yearly kidney function blood test for diabetes  08/14/2023   Flu Shot  11/07/2023*   DTaP/Tdap/Td vaccine (3 - Td or Tdap) 12/01/2023   Colon Cancer Screening  04/26/2024   Medicare Annual Wellness Visit  09/11/2024   Pneumonia Vaccine  Completed   DEXA scan (bone density measurement)  Completed   Hepatitis C Screening  Completed   HPV Vaccine  Aged Out  *Topic was postponed. The date shown is not the original due date.    Advanced directives: (Declined) Advance directive discussed with you today. Even though you declined this today, please call our office should you change your mind, and we can give you the proper paperwork for you to fill out.  Next Medicare Annual Wellness Visit scheduled for next year: Yes 09/14/24 @ 9:30

## 2023-09-12 NOTE — Therapy (Addendum)
OUTPATIENT PHYSICAL THERAPY LOWER EXTREMITY EVALUATION   Patient Name: Kathy Bryant MRN: [id number] DOB:05-22-1946, 78 y.o., female Today's Date: 09/12/2023  END OF SESSION:  PT End of Session - 09/12/23 1136     Visit Number 1    Number of Visits 16    Date for PT Re-Evaluation 11/07/23    Authorization Type [employer] 2025    Authorization Time Period 1/29-3/31    Authorization - Visit Number 1    Authorization - Number of Visits 16    Progress Note Due on Visit 8    PT Start Time 0946    PT Stop Time 1031    PT Time Calculation (min) 45 min    Activity Tolerance Patient tolerated treatment well    Behavior During Therapy WFL for tasks assessed/performed             Past Medical History:  Diagnosis Date   Anxiety    Bilateral calf pain    Diabetes mellitus without complication (HCC)    PT STATES SHE WAS ON METFORMIN AND STOPPED TAKING THIS ON HER OWN   Fatty liver    Hepatitis 1960'S   B   Herniated disc, cervical    HTN (hypertension)    PT WAS ON BP MEDS AND THEN STATES HER BP WAS UNDER CONTROL AND SHE STOPPED TAKING BP ON HER OWN   Microscopic hematuria    Obesity    Swelling of both lower extremities    Past Surgical History:  Procedure Laterality Date   APPENDECTOMY  1968   BREAST BIOPSY Right 2013   x2 BENIGN BREAST TISSUE WITH FOCAL HYALINIZED STROMA AND BENIGN BREAST TISSUE WITH FOCAL FIBROADENOMATOUS CHANGE    CESAREAN SECTION  1972 & 1978   CHOLECYSTECTOMY     COLONOSCOPY  03/05/2014   COLONOSCOPY WITH PROPOFOL N/A 04/27/2019   Procedure: COLONOSCOPY WITH PROPOFOL;  Surgeon: Midge Minium, MD;  Location: [facility];  Service: Endoscopy;  Laterality: N/A;   ESOPHAGOGASTRODUODENOSCOPY (EGD) WITH PROPOFOL N/A 04/27/2019   Procedure: ESOPHAGOGASTRODUODENOSCOPY (EGD) WITH PROPOFOL;  Surgeon: Midge Minium, MD;  Location: [facility];  Service: Endoscopy;  Laterality: N/A;   GALLBLADDER SURGERY  1982   GASTRIC RESTRICTION SURGERY  1980    'stomach stapled' with banding   HEMORRHOID SURGERY N/A 12/23/2016   Procedure: HEMORRHOIDECTOMY;  Surgeon: Kieth Brightly, MD;  Location: [facility];  Service: General;  Laterality: N/A;   HERNIA REPAIR     Abdominal-ventral   POLYPECTOMY N/A 04/27/2019   Procedure: POLYPECTOMY;  Surgeon: Midge Minium, MD;  Location: [facility];  Service: Endoscopy;  Laterality: N/A;   TOTAL ABDOMINAL HYSTERECTOMY W/ BILATERAL SALPINGOOPHORECTOMY  1998   Patient Active Problem List   Diagnosis Date Noted   Abnormal chest x-ray 08/05/2022   Left ankle pain 04/09/2022   Postural dizziness with presyncope 04/09/2022   Bacterial conjunctivitis of right eye 10/13/2021   Former smoker 09/09/2021   Pancreatic mass 07/14/2021   Shingles 09/25/2020   Close exposure to COVID-19 virus 04/28/2020   Weight loss 03/11/2020   Chest tightness 10/23/2019   Nausea 10/23/2019   History of colonic polyps    Polyp of transverse colon    Diaphragmatic hernia without obstruction and without gangrene    Depression 09/20/2018   Change in bowel habit 08/18/2018   Cirrhosis of liver (HCC) 05/20/2018   Arthralgia 05/12/2018   Fatty liver disease, nonalcoholic 05/12/2018   Screening for breast cancer 05/02/2017   Anxiety 12/15/2016   Hemorrhoid  12/15/2016   Right ankle pain 02/27/2015   Obesity 02/27/2015   Sinusitis, chronic 10/07/2014   Cough in adult patient 09/12/2014   Routine general medical examination at a health care facility 11/30/2013   Fatigue 11/30/2013   Vitamin D deficiency 11/30/2013   B12 deficiency 11/30/2013   HLD (hyperlipidemia) 11/30/2013   Screening for osteoporosis 11/30/2013   Need for prophylactic vaccination against Streptococcus pneumoniae (pneumococcus) 11/30/2013   Screen for colon cancer 11/30/2013   Need for Td vaccine 11/30/2013   DM (diabetes mellitus) with complications (HCC) 10/18/2013   Fatty liver 10/18/2013   Abnormal mammogram with microcalcification  12/20/2011    PCP: Allegra Grana, FNP  REFERRING PROVIDER: Rodolph Bong, MD  REFERRING DIAG: M79.18 (ICD-10-CM) - Right buttock pain  THERAPY DIAG:  Pain in right hip  Rationale for Evaluation and Treatment: Rehabilitation  ONSET DATE: December 2024  SUBJECTIVE:   SUBJECTIVE STATEMENT: See pertinent history   PERTINENT HISTORY: Pt reported having to pick up her husband after falls and started feeling pain in her right hip. Said she doesn't know if that was what caused the pain directly but maybe. Reports having leg cramps from dehydration that has been a problem before. Was taking prednisone for 5 days and ibuprofen but still experiencing pain.   PAIN:  Are you having pain? Yes: NPRS scale: Worst 8/10  Pain location: middle of the butt, spreads down middle of thigh, no N/T Pain description: constant and always, ache  Aggravating factors: sitting on it, lean over to take out of dishwasher or oven Relieving factors: standing up and walking takes pressure   PRECAUTIONS: None  RED FLAGS: None   WEIGHT BEARING RESTRICTIONS: No  FALLS:  Has patient fallen in last 6 months? No  LIVING ENVIRONMENT: Lives with: lives with their family and lives with their spouse Lives in: House/apartment Stairs: Yes: Internal: 16 steps; can reach both and External: 3 steps; can reach both Has following equipment at home: None  OCCUPATION: Investment banker, corporate   PLOF: Independent  PATIENT GOALS: no pain   NEXT MD VISIT: Wednesday for PCP  OBJECTIVE:  Note: Objective measures were completed at Evaluation unless otherwise noted.  Vitals: BP: 120/64 SpO2: 97  HR: 72  DIAGNOSTIC FINDINGS:  PATIENT SURVEYS:  Modified Oswestry to be tested visit #2   COGNITION: Overall cognitive status: Within functional limits for tasks assessed     SENSATION: WFL  MUSCLE LENGTH: Hamstrings: Right 70* deg; Left 80 deg Thomas test: Right NT deg; Left NT deg  POSTURE: No Significant  postural limitations  PALPATION: To be tested visit #2  LOWER EXTREMITY ROM:  Active ROM Right eval Left eval  Hip flexion 117 117  Hip extension    Hip abduction    Hip adduction    Hip internal rotation 28 27  Hip external rotation 28 28  Knee flexion    Knee extension    Ankle dorsiflexion    Ankle plantarflexion    Ankle inversion    Ankle eversion     (Blank rows = not tested)  LOWER EXTREMITY MMT:  MMT Right eval Left eval  Hip flexion 4 3*  Hip extension 3+* 4-  Hip abduction 4- 4-  Hip adduction 4 4  Hip internal rotation 4 4+*  Hip external rotation 4* 4+  Knee flexion 4+* 4+  Knee extension 4+ 4+  Ankle dorsiflexion    Ankle plantarflexion    Ankle inversion    Ankle eversion     (  Blank rows = not tested)  Pain while testing left side occurs on right side  LOWER EXTREMITY SPECIAL TESTS:  Hip special tests: Ely's test: positive bilaterally R>L Straight leg raise: negative   FUNCTIONAL TESTS:  NT  GAIT: Distance walked: 15 ft Assistive device utilized: None Level of assistance: Complete Independence Comments: NT                                                                                                                                TREATMENT DATE:  09/12/23 THEREX Isometric knee flexion 1x10 with 5 sec hold Isometric hip extension x1  - increase of concordant pain  Eccentric standing knee flexion 1x10  - pt reported too easy   PATIENT EDUCATION:  Education details: Form and technique for correct performance of exercise and explanation about exam findings   Person educated: Patient Education method: Explanation, Demonstration, Verbal cues, and Handouts Education comprehension: verbalized understanding, returned demonstration, and verbal cues required  HOME EXERCISE PROGRAM: Access Code: [email or web address] URL: [email or web address] Date: 09/12/2023 Prepared by: Consuelo Pandy  Exercises - Seated Isometric Knee Flexion   - 1 x daily - 3-4 x weekly - 3 sets - 10 reps - 5 sec hold - Prone Quadriceps Stretch with Strap  - 1 x daily - 7 x weekly - 3 sets - 30 sec hold  ASSESSMENT:  CLINICAL IMPRESSION: Patient is a 78 y.o. woman who was seen today for physical therapy evaluation and treatment for right buttock pain located over the ischial tuberosity at proximal hamstring attachment. Pt presents with pain and decreased hip strength and ROM. Pt shows signs and symptoms consistent with proximal hamstring tendinopathy as shown by pain with sitting on the attachment site, hip extension and knee flexion. Pt had pain with some movements of the contralateral side which may be from pushing off on the right side for leverage. Pt also had tightness in her hip flexors as shown by a positive bilateral Ely's test. Lumbar radiculopathy was ruled out with a negative straight leg raise and no hard neurological signs. Further testing will be needed to rule out other hip differenctionals as the pain generator. Pt will benefit from skilled PT to address and treat these deficits to return to her daily activities that require bending without pain and for caregiver tasks.   OBJECTIVE IMPAIRMENTS: Abnormal gait, decreased activity tolerance, decreased endurance, decreased ROM, decreased strength, and pain.   ACTIVITY LIMITATIONS: carrying, lifting, bending, sitting, standing, squatting, transfers, and bed mobility  PARTICIPATION LIMITATIONS: meal prep, cleaning, and laundry  PERSONAL FACTORS: Age, Time since onset of injury/illness/exacerbation, and 3+ comorbidities: DM, anxiety, HTN  are also affecting patient's functional outcome.   REHAB POTENTIAL: Good  CLINICAL DECISION MAKING: Stable/uncomplicated  EVALUATION COMPLEXITY: Low   GOALS: Goals reviewed with patient? No  SHORT TERM GOALS: Target date: 09/26/2023  Patient will demonstrate independent understanding of HEP to be able effectively manage underlying MSK condition.   Baseline: NT  Goal status: INITIAL   LONG TERM GOALS: Target date: 11/07/2023  Pt will increase LEFS by at least 9 points in order to demonstrate significant improvement in lower extremity function.  Baseline: NT  Goal status: INITIAL   Pt will improve right hip strength to be nearly symmetrical to the left side for increased function and stability to complete everyday tasks that require bending and lifting.  Baseline:  MMT Right eval Left eval  Hip flexion 4 3*  Hip extension 3+* 4-  Hip internal rotation 4 4+*  Hip external rotation 4* 4+   Goal status: INITIAL  3. Pt will complete 5 times sit to stand test in less than 13 seconds as evidence of improvement LE strength and increased lower extremity function and strength (Bohannon, 2007).  Baseline: NT Goal status: INITIAL    PLAN:  PT FREQUENCY: 2x/week  PT DURATION: 8 weeks  PLANNED INTERVENTIONS: 97110-Therapeutic exercises, 97530- Therapeutic activity, 97112- Neuromuscular re-education, 97535- Self Care, 16109- Manual therapy, Balance training, Dry Needling, Joint mobilization, Joint manipulation, Cryotherapy, and Moist heat  PLAN FOR NEXT SESSION: Need to take LEFS. Review quad stretch in HEP. FADDIR and FABER test. 5 STS test. Standing knee flexion and hip extension with focus on eccentric. Low load leg press and hamstring curl with eccentric focus.    [employer], Student-PT 09/12/2023, 11:41 AM

## 2023-09-13 NOTE — Progress Notes (Signed)
 Assessment & Plan:  Memory changes Assessment & Plan: MMSE 30/30 which is reassuring.  Pending thorough lab evaluation, MRI of the brain.  Orders: -     VITAMIN D  25 Hydroxy (Vit-D Deficiency, Fractures); Future -     CBC with Differential/Platelet; Future -     Comprehensive metabolic panel; Future -     TSH; Future -     B12 and Folate Panel; Future -     RPR; Future -     MR BRAIN W WO CONTRAST; Future  Elevated glucose -     POCT glycosylated hemoglobin (Hb A1C)  Right buttock pain Assessment & Plan: Chronic, unchanged.  Etiology unclear and strongly advised continue close follow-up with sports medicine.  She may require MRI.  Patient verbalized understanding.  Provided tizanidine  today for symptom relief   Encounter for screening mammogram for malignant neoplasm of breast -     3D Screening Mammogram, Left and Right; Future  Screening for lung cancer -     Ambulatory Referral for Lung Cancer Scre  DM (diabetes mellitus) with complications (HCC) Assessment & Plan: Chronic, excellent control.  For additional benefit of weight loss, increase Ozempic  to 0.5 mg.  Orders: -     Lipid panel; Future -     Microalbumin / creatinine urine ratio; Future -     Semaglutide (0.25 or 0.5MG /DOS); Inject 0.5 mg into the skin once a week.  Dispense: 3 mL; Refill: 3 -     Ambulatory referral to Ophthalmology     Return precautions given.   Risks, benefits, and alternatives of the medications and treatment plan prescribed today were discussed, and patient expressed understanding.   Education regarding symptom management and diagnosis given to patient on AVS either electronically or printed.  Return in about 3 months (around 12/12/2023) for Fasting labs in 2-3 weeks.  Kathy Northern, FNP  Subjective:    Patient ID: Kathy Bryant, female    DOB: [birth year], 78 y.o.   MRN: [id number]  CC: Kathy Bryant is a 78 y.o. female who presents today for follow up.   HPI: She  complains right buttucks pain and into right posterior upper thigh x 5 weeks, unchanged.  She suspects injury occurred when helping her husband from falling in which she pulled him up and help him onto bench 5 weeks ago.   No numbness, saddle anesthesia, urinary or fecal incontinence, numbness, low back pain.   Improvement with acupuncture, TENS unit.   Sitting pain is aggravate. When standing, 'it doesn't bother me at all.'  She has seen Sports Medicine 09/04/22 , Dr Joane , PT and prednisone  taper without relief. Consider MRI. She has been taking ibuprofen 400mg  qpm.  No ckd.   She has tolerated Ozempic  well.  She is interested in increasing dose for additional weight loss.  She is felt that her memory is not as good.  She does not recall names like she used to.  She continues to drive, cook.  She has not forgotten [facility] names.  Sleeping well.  Denies depression Allergies: Simvastatin  Current Outpatient Medications on File Prior to Visit  Medication Sig Dispense Refill   albuterol  (VENTOLIN  HFA) 108 (90 Base) MCG/ACT inhaler Inhale 1-2 puffs into the lungs every 6 (six) hours as needed for wheezing or shortness of breath. (Patient not taking: Reported on 09/14/2023) 8 g 0   ALPRAZolam  (XANAX ) 0.25 MG tablet Take 1 tablet by mouth twice daily as needed for anxiety (Patient not taking: Reported  on 09/14/2023) 30 tablet 0   cyanocobalamin  (,VITAMIN B-12,) 1000 MCG/ML injection 1000 mcg (1 mL) intramuscular injection in the thigh ( vastus lateralis)  once per month. (Patient not taking: Reported on 09/14/2023) 1 mL 15   fluticasone  (FLONASE ) 50 MCG/ACT nasal spray Place 1 spray into both nostrils 2 (two) times daily. (Patient not taking: Reported on 09/14/2023) 16 g 0   losartan  (COZAAR ) 25 MG tablet Take 0.5 tablets (12.5 mg total) by mouth daily. (Patient not taking: Reported on 09/14/2023) 90 tablet 1   pravastatin  (PRAVACHOL ) 20 MG tablet Take 1 tablet (20 mg total) by mouth once a week.  (Patient not taking: Reported on 09/14/2023) 12 tablet 1   No current facility-administered medications on file prior to visit.    Review of Systems  Constitutional:  Negative for chills and fever.  Respiratory:  Negative for cough.   Cardiovascular:  Negative for chest pain and palpitations.  Gastrointestinal:  Negative for nausea and vomiting.  Genitourinary:  Negative for difficulty urinating.  Musculoskeletal:  Positive for arthralgias.  Neurological:  Negative for numbness.      Objective:    BP 122/72   Pulse 76   Temp 97.7 F (36.5 C) (Oral)   Ht 5' 3 (1.6 m)   Wt 183 lb 8 oz (83.2 kg)   SpO2 97%   BMI 32.51 kg/m  BP Readings from Last 3 Encounters:  09/14/23 122/72  09/05/23 122/80  08/05/22 139/86   Wt Readings from Last 3 Encounters:  09/14/23 183 lb 8 oz (83.2 kg)  09/12/23 182 lb (82.6 kg)  09/05/23 186 lb (84.4 kg)    Physical Exam Vitals reviewed.  Constitutional:      Appearance: She is well-developed.  Eyes:     Conjunctiva/sclera: Conjunctivae normal.  Cardiovascular:     Rate and Rhythm: Normal rate and regular rhythm.     Pulses: Normal pulses.     Heart sounds: Normal heart sounds.  Pulmonary:     Effort: Pulmonary effort is normal.     Breath sounds: Normal breath sounds. No wheezing, rhonchi or rales.  Musculoskeletal:     Thoracic back: No spasms, tenderness or bony tenderness.     Lumbar back: No tenderness or bony tenderness. Negative right straight leg raise test and negative left straight leg raise test.       Legs:     Comments: Focal deep pain right buttocks with palpation.  No edema, mass appreciated   Skin:    General: Skin is warm and dry.  Neurological:     Mental Status: She is alert.  Psychiatric:        Speech: Speech normal.        Behavior: Behavior normal.        Thought Content: Thought content normal.

## 2023-09-14 ENCOUNTER — Ambulatory Visit: Payer: Medicare PPO | Admitting: Family

## 2023-09-14 ENCOUNTER — Other Ambulatory Visit: Payer: Self-pay | Admitting: Family

## 2023-09-14 ENCOUNTER — Encounter: Payer: Self-pay | Admitting: Emergency Medicine

## 2023-09-14 ENCOUNTER — Encounter: Payer: Self-pay | Admitting: Family

## 2023-09-14 ENCOUNTER — Encounter: Payer: Self-pay | Admitting: Physical Therapy

## 2023-09-14 ENCOUNTER — Ambulatory Visit: Payer: Medicare PPO | Admitting: Physical Therapy

## 2023-09-14 VITALS — BP 122/72 | HR 76 | Temp 97.7°F | Ht 63.0 in | Wt 183.5 lb

## 2023-09-14 DIAGNOSIS — M7918 Myalgia, other site: Secondary | ICD-10-CM

## 2023-09-14 DIAGNOSIS — E118 Type 2 diabetes mellitus with unspecified complications: Secondary | ICD-10-CM | POA: Diagnosis not present

## 2023-09-14 DIAGNOSIS — M25551 Pain in right hip: Secondary | ICD-10-CM

## 2023-09-14 DIAGNOSIS — Z7985 Long-term (current) use of injectable non-insulin antidiabetic drugs: Secondary | ICD-10-CM

## 2023-09-14 DIAGNOSIS — Z122 Encounter for screening for malignant neoplasm of respiratory organs: Secondary | ICD-10-CM | POA: Diagnosis not present

## 2023-09-14 DIAGNOSIS — R7309 Other abnormal glucose: Secondary | ICD-10-CM

## 2023-09-14 DIAGNOSIS — R413 Other amnesia: Secondary | ICD-10-CM | POA: Diagnosis not present

## 2023-09-14 DIAGNOSIS — Z1231 Encounter for screening mammogram for malignant neoplasm of breast: Secondary | ICD-10-CM

## 2023-09-14 MED ORDER — TIZANIDINE HCL 2 MG PO CAPS: 2.0000 mg | ORAL_CAPSULE | Freq: Every evening | ORAL | 1 refills | Status: DC | PRN

## 2023-09-14 MED ORDER — SEMAGLUTIDE(0.25 OR 0.5MG/DOS) 2 MG/3ML ~~LOC~~ SOPN: 0.5000 mg | PEN_INJECTOR | SUBCUTANEOUS | 3 refills | Status: DC

## 2023-09-14 NOTE — Patient Instructions (Addendum)
 Please call  and schedule your 3D mammogram and /or bone density scan as we discussed.  Trial of tizanidine  which is a muscle relaxant for right buttocks pain .please reach out to sports medicine in regards to proceeding with MRI if pain persist   Do not drive or operate heavy machinery while on muscle relaxant. Please do not drink alcohol. Only take this medication as needed for acute muscle spasm at bedtime. This medication make you feel drowsy so be very careful.  Stop taking if become too drowsy or somnolent as this puts you at risk for falls. Please contact our office with any questions.   [facility]  ( new location in 2023)  [address], [place], [place] [address]  [place], [place]  [phone]  Call to make an appointment for Annual Lung cancer screen  With CT Chest:  [phone]. Let me know if any issues in doing so.  Placed a referral to [facility].    Let us  know if you dont hear back within a week in regards to an appointment being scheduled.   So that you are aware, if you are [facility] MyChart user , please pay attention to your MyChart messages as you may receive a MyChart message with a phone number to call and schedule this test/appointment own your own from our referral coordinator. This is a new process so I do not want you to miss this message.  If you are not a MyChart user, you will receive a phone call.

## 2023-09-14 NOTE — Assessment & Plan Note (Addendum)
 MMSE 30/30 which is reassuring.  Pending thorough lab evaluation, MRI of the brain.

## 2023-09-14 NOTE — Therapy (Addendum)
 OUTPATIENT PHYSICAL THERAPY LOWER EXTREMITY TREATMENT   Patient Name: Kathy Bryant MRN: [id number] DOB:1945-12-06, 78 y.o., female Today's Date: 09/14/2023  END OF SESSION:  PT End of Session - 09/14/23 0824     Visit Number 2    Number of Visits 16    Date for PT Re-Evaluation 11/07/23    Authorization Type [employer] 2025    Authorization Time Period 1/29-3/31    Authorization - Visit Number 2    Authorization - Number of Visits 16    Progress Note Due on Visit 8    PT Start Time 0821    PT Stop Time 0851    PT Time Calculation (min) 30 min    Activity Tolerance Patient limited by pain    Behavior During Therapy WFL for tasks assessed/performed             Past Medical History:  Diagnosis Date   Anxiety    Bilateral calf pain    Diabetes mellitus without complication (HCC)    PT STATES SHE WAS ON METFORMIN  AND STOPPED TAKING THIS ON HER OWN   Fatty liver    Hepatitis 1960'S   B   Herniated disc, cervical    HTN (hypertension)    PT WAS ON BP MEDS AND THEN STATES HER BP WAS UNDER CONTROL AND SHE STOPPED TAKING BP ON HER OWN   Microscopic hematuria    Obesity    Swelling of both lower extremities    Past Surgical History:  Procedure Laterality Date   APPENDECTOMY  1968   BREAST BIOPSY Right 2013   x2 BENIGN BREAST TISSUE WITH FOCAL HYALINIZED STROMA AND BENIGN BREAST TISSUE WITH FOCAL FIBROADENOMATOUS CHANGE    CESAREAN SECTION  1972 & 1978   CHOLECYSTECTOMY     COLONOSCOPY  03/05/2014   COLONOSCOPY WITH PROPOFOL  N/A 04/27/2019   Procedure: COLONOSCOPY WITH PROPOFOL ;  Surgeon: Jinny Carmine, MD;  Location: [facility];  Service: Endoscopy;  Laterality: N/A;   ESOPHAGOGASTRODUODENOSCOPY (EGD) WITH PROPOFOL  N/A 04/27/2019   Procedure: ESOPHAGOGASTRODUODENOSCOPY (EGD) WITH PROPOFOL ;  Surgeon: Jinny Carmine, MD;  Location: [facility];  Service: Endoscopy;  Laterality: N/A;   GALLBLADDER SURGERY  1982   GASTRIC RESTRICTION SURGERY  1980   'stomach  stapled' with banding   HEMORRHOID SURGERY N/A 12/23/2016   Procedure: HEMORRHOIDECTOMY;  Surgeon: Dellie Louanne MATSU, MD;  Location: [facility];  Service: General;  Laterality: N/A;   HERNIA REPAIR     Abdominal-ventral   POLYPECTOMY N/A 04/27/2019   Procedure: POLYPECTOMY;  Surgeon: Jinny Carmine, MD;  Location: [facility];  Service: Endoscopy;  Laterality: N/A;   TOTAL ABDOMINAL HYSTERECTOMY W/ BILATERAL SALPINGOOPHORECTOMY  1998   Patient Active Problem List   Diagnosis Date Noted   Abnormal chest x-ray 08/05/2022   Left ankle pain 04/09/2022   Postural dizziness with presyncope 04/09/2022   Bacterial conjunctivitis of right eye 10/13/2021   Former smoker 09/09/2021   Pancreatic mass 07/14/2021   Shingles 09/25/2020   Close exposure to COVID-19 virus 04/28/2020   Weight loss 03/11/2020   Chest tightness 10/23/2019   Nausea 10/23/2019   History of colonic polyps    Polyp of transverse colon    Diaphragmatic hernia without obstruction and without gangrene    Depression 09/20/2018   Change in bowel habit 08/18/2018   Cirrhosis of liver (HCC) 05/20/2018   Arthralgia 05/12/2018   Fatty liver disease, nonalcoholic 05/12/2018   Screening for breast cancer 05/02/2017   Anxiety 12/15/2016   Hemorrhoid  12/15/2016   Right ankle pain 02/27/2015   Obesity 02/27/2015   Sinusitis, chronic 10/07/2014   Cough in adult patient 09/12/2014   Routine general medical examination at a health care facility 11/30/2013   Fatigue 11/30/2013   Vitamin D  deficiency 11/30/2013   B12 deficiency 11/30/2013   HLD (hyperlipidemia) 11/30/2013   Screening for osteoporosis 11/30/2013   Need for prophylactic vaccination against Streptococcus pneumoniae (pneumococcus) 11/30/2013   Screen for colon cancer 11/30/2013   Need for Td vaccine 11/30/2013   DM (diabetes mellitus) with complications (HCC) 10/18/2013   Fatty liver 10/18/2013   Abnormal mammogram with microcalcification 12/20/2011     PCP: Dineen Rollene MATSU, FNP  REFERRING PROVIDER: Joane Artist RAMAN, MD  REFERRING DIAG: M79.18 (ICD-10-CM) - Right buttock pain  THERAPY DIAG:  Pain in right hip  Rationale for Evaluation and Treatment: Rehabilitation  ONSET DATE: December 2024  SUBJECTIVE:   SUBJECTIVE STATEMENT: Pt is coming in with her pain around 6/10 and says it is usually not bad in the morning and gets worse by nighttime, usually from sitting during the day. Pt says using TENS unit and sitting on ice has helped. Pt reported she had to leave 10 minutes to make it to another doctors appointment.   PERTINENT HISTORY: Pt reported having to pick up her husband after falls and started feeling pain in her right hip. Said she doesn't know if that was what caused the pain directly but maybe. Reports having leg cramps from dehydration that has been a problem before. Was taking prednisone  for 5 days and ibuprofen but still experiencing pain.   PAIN:  Are you having pain? Yes: NPRS scale: Worst 8/10  Pain location: middle of the butt, spreads down middle of thigh, no N/T Pain description: constant and always, ache  Aggravating factors: sitting on it, lean over to take out of dishwasher or oven Relieving factors: standing up and walking takes pressure   PRECAUTIONS: None  RED FLAGS: None   WEIGHT BEARING RESTRICTIONS: No  FALLS:  Has patient fallen in last 6 months? No  LIVING ENVIRONMENT: Lives with: lives with their family and lives with their spouse Lives in: House/apartment Stairs: Yes: Internal: 16 steps; can reach both and External: 3 steps; can reach both Has following equipment at home: None  OCCUPATION: Investment Banker, Corporate   PLOF: Independent  PATIENT GOALS: no pain   NEXT MD VISIT: Wednesday for PCP  OBJECTIVE:  Note: Objective measures were completed at Evaluation unless otherwise noted.  Vitals: BP: 120/64 SpO2: 97  HR: 72  DIAGNOSTIC FINDINGS:  PATIENT SURVEYS:  LEFS  42/80  COGNITION: Overall cognitive status: Within functional limits for tasks assessed     SENSATION: WFL  MUSCLE LENGTH: Hamstrings: Right 70* deg; Left 80 deg Thomas test: Right NT deg; Left NT deg  POSTURE: No Significant postural limitations  PALPATION: To be tested visit #2  LOWER EXTREMITY ROM:  Active ROM Right eval Left eval  Hip flexion 117 117  Hip extension    Hip abduction    Hip adduction    Hip internal rotation 28 27  Hip external rotation 28 28  Knee flexion    Knee extension    Ankle dorsiflexion    Ankle plantarflexion    Ankle inversion    Ankle eversion     (Blank rows = not tested)  LOWER EXTREMITY MMT:  MMT Right eval Left eval  Hip flexion 4 3*  Hip extension 3+* 4-  Hip abduction 4- 4-  Hip adduction 4 4  Hip internal rotation 4 4+*  Hip external rotation 4* 4+  Knee flexion 4+* 4+  Knee extension 4+ 4+  Ankle dorsiflexion    Ankle plantarflexion    Ankle inversion    Ankle eversion     (Blank rows = not tested)  Pain while testing left side occurs on right side  LOWER EXTREMITY SPECIAL TESTS:  Hip special tests: Ely's test: positive bilaterally R>L Straight leg raise: negative   FUNCTIONAL TESTS:  5 times sit to stand: 19.36 seconds   GAIT: Distance walked: 15 ft Assistive device utilized: None Level of assistance: Complete Independence Comments: NT                                                                                                                               TREATMENT DATE:  09/14/23 THEREX Nu step seat 8 for 4 min   -pt reported discomfort from sitting OMEGA leg press 1x10 #10  -pt reported it feeling silly and light OMEGA leg press 1x10 #20  -pt reported increase of pain  OMEGA leg press 3x10 #15  PHYSICAL PERFORMANCE  5 STS 19.63sec FADDIR/FABER negative BL  09/12/23 THEREX Isometric knee flexion 1x10 with 5 sec hold Isometric hip extension x1  - increase of concordant pain  Eccentric  standing knee flexion 1x10  - pt reported too easy   PATIENT EDUCATION:  Education details: Form and technique for correct performance of exercise and explanation about exam findings   Person educated: Patient Education method: Explanation, Demonstration, Verbal cues, and Handouts Education comprehension: verbalized understanding, returned demonstration, and verbal cues required  HOME EXERCISE PROGRAM: Access Code: [email or web address] URL: [email or web address] Date: 09/14/2023 Prepared by: Arleene Euler  Exercises - Seated Isometric Knee Flexion  - 1 x daily - 3-4 x weekly - 3 sets - 10 reps - 5 sec hold - Sidelying Quadriceps Stretch with Strap  - 1 x daily - 7 x weekly - 3 sets - 60 sec hold  ASSESSMENT:  CLINICAL IMPRESSION: Patient is very limited by pain during exercises, especially exercises done in sitting. Pt may benefit from standing or sidelying exercises until pain decreases or until she gets a cushion to decrease pressure. Pt also has limitations in bed mobility and transfers from the pain when pushing off her right leg and will benefit from isometric or eccentric exercises to bring down the pain to begin building up lower extremity strength. Session ended early due to pt having another appointment shortly after end of session.   OBJECTIVE IMPAIRMENTS: Abnormal gait, decreased activity tolerance, decreased endurance, decreased ROM, decreased strength, and pain.   ACTIVITY LIMITATIONS: carrying, lifting, bending, sitting, standing, squatting, transfers, and bed mobility  PARTICIPATION LIMITATIONS: meal prep, cleaning, and laundry  PERSONAL FACTORS: Age, Time since onset of injury/illness/exacerbation, and 3+ comorbidities: DM, anxiety, HTN  are also affecting patient's functional outcome.   REHAB POTENTIAL: Good  CLINICAL DECISION MAKING: Stable/uncomplicated  EVALUATION COMPLEXITY:  Low   GOALS: Goals reviewed with patient? No  SHORT TERM GOALS: Target  date: 09/26/2023  Patient will demonstrate independent understanding of HEP to be able effectively manage underlying MSK condition.  Baseline: NT Goal status: ONGOING   LONG TERM GOALS: Target date: 11/07/2023  Pt will increase LEFS by at least 9 points in order to demonstrate significant improvement in lower extremity function.  Baseline: 42/80 Goal status: ONGOING   Pt will improve right hip strength to be nearly symmetrical to the left side for increased function and stability to complete everyday tasks that require bending and lifting.  Baseline:  MMT Right eval Left eval  Hip flexion 4 3*  Hip extension 3+* 4-  Hip internal rotation 4 4+*  Hip external rotation 4* 4+   Goal status: ONGOING  3. Pt will complete 5 times sit to stand test in less than 13 seconds as evidence of improvement LE strength and increased lower extremity function and strength (Bohannon, 2007).  Baseline: 19.36 seconds  Goal status: ONGOING   PLAN:  PT FREQUENCY: 2x/week  PT DURATION: 8 weeks  PLANNED INTERVENTIONS: 97110-Therapeutic exercises, 97530- Therapeutic activity, 97112- Neuromuscular re-education, 97535- Self Care, [id number]- Manual therapy, Balance training, Dry Needling, Joint mobilization, Joint manipulation, Cryotherapy, and Moist heat  PLAN FOR NEXT SESSION: Standing knee flexion and hip extension with focus on eccentric. Hamstring curl with eccentric focus.    [employer], Student-PT 09/14/2023, 9:12 AM

## 2023-09-15 ENCOUNTER — Other Ambulatory Visit: Payer: Self-pay | Admitting: *Deleted

## 2023-09-15 DIAGNOSIS — Z87891 Personal history of nicotine dependence: Secondary | ICD-10-CM

## 2023-09-15 DIAGNOSIS — F17210 Nicotine dependence, cigarettes, uncomplicated: Secondary | ICD-10-CM

## 2023-09-15 DIAGNOSIS — Z122 Encounter for screening for malignant neoplasm of respiratory organs: Secondary | ICD-10-CM

## 2023-09-15 NOTE — Assessment & Plan Note (Signed)
 Chronic, unchanged.  Etiology unclear and strongly advised continue close follow-up with sports medicine.  She may require MRI.  Patient verbalized understanding.  Provided tizanidine  today for symptom relief

## 2023-09-15 NOTE — Assessment & Plan Note (Signed)
 Chronic, excellent control.  For additional benefit of weight loss, increase Ozempic  to 0.5 mg.

## 2023-09-16 ENCOUNTER — Encounter: Payer: Self-pay | Admitting: Family Medicine

## 2023-09-16 DIAGNOSIS — M7918 Myalgia, other site: Secondary | ICD-10-CM

## 2023-09-16 LAB — POCT GLYCOSYLATED HEMOGLOBIN (HGB A1C): Hemoglobin A1C: 6.3 % — AB (ref 4.0–5.6)

## 2023-09-19 ENCOUNTER — Ambulatory Visit: Payer: Medicare PPO | Admitting: Physical Therapy

## 2023-09-22 ENCOUNTER — Encounter: Payer: Medicare PPO | Admitting: Physical Therapy

## 2023-09-25 ENCOUNTER — Other Ambulatory Visit: Payer: Self-pay | Admitting: Family

## 2023-09-25 DIAGNOSIS — F419 Anxiety disorder, unspecified: Secondary | ICD-10-CM

## 2023-09-25 DIAGNOSIS — E538 Deficiency of other specified B group vitamins: Secondary | ICD-10-CM

## 2023-09-27 ENCOUNTER — Encounter: Payer: Medicare PPO | Admitting: Physical Therapy

## 2023-09-29 ENCOUNTER — Ambulatory Visit: Payer: Medicare PPO | Admitting: Physical Therapy

## 2023-09-30 ENCOUNTER — Encounter: Payer: Self-pay | Admitting: Adult Health

## 2023-09-30 ENCOUNTER — Ambulatory Visit: Payer: Medicare PPO | Admitting: Adult Health

## 2023-09-30 DIAGNOSIS — F17210 Nicotine dependence, cigarettes, uncomplicated: Secondary | ICD-10-CM | POA: Diagnosis not present

## 2023-09-30 NOTE — Progress Notes (Signed)
  Virtual Visit via Telephone Note  I connected with Kathy Bryant , 09/30/23 8:45 AM by a telemedicine application and verified that I am speaking with the correct person using two identifiers.  Location: Patient: home Provider: home   I discussed the limitations of evaluation and management by telemedicine and the availability of in person appointments. The patient expressed understanding and agreed to proceed.   Shared Decision Making Visit Lung Cancer Screening Program [phone])   Eligibility: 78 y.o. Pack Years Smoking History Calculation = 20 pack years  (# packs/per year x # years smoked) Recent History of coughing up blood  no Unexplained weight loss? no ( >Than 15 pounds within the last 6 months ) Prior History Lung / other cancer no (Diagnosis within the last 5 years already requiring surveillance chest CT Scans). Smoking Status Current Smoker  Visit Components: Discussion included one or more decision making aids. YES Discussion included risk/benefits of screening. YES Discussion included potential follow up diagnostic testing for abnormal scans. YES Discussion included meaning and risk of over diagnosis. YES Discussion included meaning and risk of False Positives. YES Discussion included meaning of total radiation exposure. YES  Counseling Included: Importance of adherence to annual lung cancer LDCT screening. YES Impact of comorbidities on ability to participate in the program. YES Ability and willingness to under diagnostic treatment. YES  Smoking Cessation Counseling: Current Smokers:  Discussed importance of smoking cessation. yes Information about tobacco cessation classes and interventions provided to patient. yes Patient provided with "ticket" for LDCT Scan. yes Symptomatic Patient. NO Diagnosis Code: Tobacco Use Z72.0 Asymptomatic Patient yes  Counseling (Intermediate counseling: > three minutes counseling) U0454 (CT Chest Lung Cancer Screening  Low Dose W/O CM) [id number]  Z12.2-Screening of respiratory organs Z87.891-Personal history of nicotine dependence   Danford Bad 09/30/23

## 2023-09-30 NOTE — Patient Instructions (Signed)

## 2023-10-03 ENCOUNTER — Ambulatory Visit
Admission: RE | Admit: 2023-10-03 | Discharge: 2023-10-03 | Disposition: A | Discharge status: Home / Self Care | Payer: Medicare PPO | Source: Ambulatory Visit | Attending: Acute Care | Admitting: Acute Care

## 2023-10-03 ENCOUNTER — Ambulatory Visit: Payer: Medicare PPO | Admitting: Physical Therapy

## 2023-10-03 DIAGNOSIS — F17210 Nicotine dependence, cigarettes, uncomplicated: Secondary | ICD-10-CM | POA: Insufficient documentation

## 2023-10-03 DIAGNOSIS — Z87891 Personal history of nicotine dependence: Secondary | ICD-10-CM | POA: Diagnosis not present

## 2023-10-03 DIAGNOSIS — Z122 Encounter for screening for malignant neoplasm of respiratory organs: Secondary | ICD-10-CM | POA: Insufficient documentation

## 2023-10-05 ENCOUNTER — Ambulatory Visit
Admission: RE | Admit: 2023-10-05 | Discharge: 2023-10-05 | Disposition: A | Discharge status: Home / Self Care | Payer: Medicare PPO | Source: Ambulatory Visit | Attending: Family | Admitting: Family

## 2023-10-05 ENCOUNTER — Ambulatory Visit
Admission: RE | Admit: 2023-10-05 | Discharge: 2023-10-05 | Disposition: A | Discharge status: Home / Self Care | Payer: Medicare PPO | Source: Ambulatory Visit | Attending: Family Medicine | Admitting: Family Medicine

## 2023-10-05 DIAGNOSIS — M7602 Gluteal tendinitis, left hip: Secondary | ICD-10-CM | POA: Diagnosis not present

## 2023-10-05 DIAGNOSIS — M160 Bilateral primary osteoarthritis of hip: Secondary | ICD-10-CM | POA: Diagnosis not present

## 2023-10-05 DIAGNOSIS — R413 Other amnesia: Secondary | ICD-10-CM

## 2023-10-05 DIAGNOSIS — M7918 Myalgia, other site: Secondary | ICD-10-CM

## 2023-10-05 MED ORDER — GADOPICLENOL 0.5 MMOL/ML IV SOLN
10.0000 mL | Freq: Once | INTRAVENOUS | Status: AC | PRN
  Administered 2023-10-05: 8 mL via INTRAVENOUS

## 2023-10-06 ENCOUNTER — Ambulatory Visit: Payer: Medicare PPO | Admitting: Physical Therapy

## 2023-10-06 ENCOUNTER — Encounter: Payer: Self-pay | Admitting: Family

## 2023-10-06 ENCOUNTER — Telehealth: Payer: Self-pay

## 2023-10-06 DIAGNOSIS — J0100 Acute maxillary sinusitis, unspecified: Secondary | ICD-10-CM

## 2023-10-06 NOTE — Telephone Encounter (Signed)
 Copied from CRM [id number]. Topic: Clinical - Medical Advice >> Oct 06, 2023  1:41 PM [place] wrote: Reason for CRM: patient received message from Rennie Plowman about lab results, she is not feeling well and she does want to have a medication called in as soon as possible.

## 2023-10-07 MED ORDER — DOXYCYCLINE HYCLATE 100 MG PO TABS: 100.0000 mg | ORAL_TABLET | Freq: Two times a day (BID) | ORAL | 0 refills | Status: AC

## 2023-10-07 NOTE — Addendum Note (Signed)
 Addended by: Allegra Grana on: 10/07/2023 10:43 AM   Modules accepted: Orders

## 2023-10-07 NOTE — Telephone Encounter (Signed)
 noted

## 2023-10-07 NOTE — Telephone Encounter (Signed)
 Call patient For suspected sinusitis in setting of symptoms and based on MRI brain ( see result note) I have sent an antibiotic doxycycline for her to start.  Please advise take with food and with a probiotic.  Please schedule a 2-3 week follow-up with me to ensure symptoms have

## 2023-10-07 NOTE — Telephone Encounter (Signed)
 Patient notified and made aware of Kathy Bryant's recommendations. Patient verbalized understanding and was offered to get patient scheduled an appointment, but says she would call to get her follow up appointment scheduled.

## 2023-10-10 ENCOUNTER — Encounter: Payer: Medicare PPO | Admitting: Physical Therapy

## 2023-10-12 ENCOUNTER — Encounter: Payer: Medicare PPO | Admitting: Physical Therapy

## 2023-10-12 ENCOUNTER — Encounter: Payer: Self-pay | Admitting: Family Medicine

## 2023-10-12 NOTE — Progress Notes (Signed)
 Pelvis MRI shows no fractures which is great news.  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  Additionally do have some mild arthritis of both hips and some back arthritis as well.  Will talk about this in full detail on your recheck appointment on March 7.

## 2023-10-14 ENCOUNTER — Ambulatory Visit: Payer: Medicare PPO | Admitting: Family Medicine

## 2023-10-14 ENCOUNTER — Other Ambulatory Visit: Payer: Self-pay

## 2023-10-14 ENCOUNTER — Encounter: Payer: Self-pay | Admitting: Family Medicine

## 2023-10-14 VITALS — BP 124/82 | HR 68 | Ht 63.0 in | Wt 183.0 lb

## 2023-10-14 DIAGNOSIS — M7918 Myalgia, other site: Secondary | ICD-10-CM

## 2023-10-14 NOTE — Patient Instructions (Addendum)
 Thank you for coming in today.  Continue off loading, can consider aquatic physical therapy if no improvement

## 2023-10-14 NOTE — Progress Notes (Signed)
 I, Kathy Bryant, CMA acting as a scribe for Kathy Graham, MD.  Kathy Bryant is a 78 y.o. female who presents to [facility] today for f/u R buttock pain and MRI review. Pt was last seen by Dr. Denyse Bryant on 09/05/23 and was advised to use an off-loader cushion, prescribed prednisone, and was referred to PT, completing 2 visits. Pt sent a f/u MyChart message on 2/7 and a MRI was ordered. Pain is located primarily at the ischial tuberosity and buttocks.  Not much pain at the lateral hip.  She notes the ischial tuberosity off loader cushion that she purchased has been significantly helpful.  Today, pt reports no change in sx since last visit. Denies new or worsening sx. Review MRI today.   Dx imaging: 10/05/23 Pelvis MRI  Pertinent review of systems: No fevers or chills  Relevant historical information: Diabetes   Exam:  BP 124/82   Pulse 68   Ht 5\' 3"  (1.6 m)   Wt 183 lb (83 kg)   SpO2 95%   BMI 32.42 kg/m  General: Well Developed, well nourished, and in no acute distress.   MSK: Right hip normal-appearing Not particularly tender to palpation at ischial tuberosity although patient does identify that is the area of pain.  Normal hip motion and strength.    Lab and Radiology Results  Procedure: Real-time Ultrasound Guided Injection of right ischial tuberosity bursa Device: Philips Affiniti 50G/GE Logiq Images permanently stored and available for review in PACS Verbal informed consent obtained.  Discussed risks and benefits of procedure. Warned about infection, bleeding, hyperglycemia damage to structures among others. Patient expresses understanding and agreement Time-out conducted.   Noted no overlying erythema, induration, or other signs of local infection.   Skin prepped in a sterile fashion.   Local anesthesia: Topical Ethyl chloride.   With sterile technique and under real time ultrasound guidance: 40 mg of Kenalog and 1 mL of lidocaine  injected into the ischial tuberosity bursa right side. Fluid seen entering the bursa.   Completed without difficulty   Pain moderately resolved suggesting accurate placement of the medication.   Advised to call if fevers/chills, erythema, induration, drainage, or persistent bleeding.   Images permanently stored and available for review in the ultrasound unit.  Impression: Technically successful ultrasound guided injection.    EXAM: MRI PELVIS WITHOUT CONTRAST   TECHNIQUE: Multiplanar multisequence MR imaging of the pelvis was performed. No intravenous contrast was administered.   COMPARISON:  None Available.   FINDINGS: Bones:   No hip fracture, dislocation or avascular necrosis.   No periosteal reaction or bone destruction. No aggressive osseous lesion.   Normal sacrum and sacroiliac joints. No SI joint widening or erosive changes.   Minimal grade 1 anterolisthesis of L4 on L5 secondary to facet disease. Moderate bilateral facet arthropathy at L5-S1. Marrow edema in the L5 pedicles bilaterally which may reflect mild stress reaction versus reactive edema secondary to adjacent facet disease.   Articular cartilage and labrum   Articular cartilage: Mild partial-thickness cartilage loss of the femoral head and acetabulum bilaterally.   Labrum: Grossly intact, but evaluation is limited by lack of intraarticular fluid.   Joint or bursal effusion   Joint effusion:  No hip joint effusion.  No SI joint effusion.   Bursae:  No bursal fluid.   Muscles and tendons   Flexors: Normal.   Extensors: Normal.   Abductors: Normal.   Adductors: Normal.   Gluteals: Moderate tendinosis of the  left gluteus minimus and gluteus medius tendon insertion. 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   Hamstrings: Normal.   Other findings   No pelvic free fluid. No fluid collection or hematoma. No inguinal lymphadenopathy. No inguinal hernia. Diverticulosis without  evidence of diverticulitis.   IMPRESSION: 1. No hip fracture, dislocation or avascular necrosis. 2. Moderate tendinosis of the left gluteus minimus and gluteus medius tendon insertion. 3. 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 4. Mild osteoarthritis of bilateral hips. 5. Moderate bilateral facet arthropathy at L5-S1. Marrow edema in the L5 pedicles bilaterally which may reflect mild stress reaction versus reactive edema secondary to adjacent facet disease.     Electronically Signed   By: Kathy Bryant M.D.   On: 10/12/2023 12:30 I, Kathy Bryant, personally (independently) visualized and performed the interpretation of the images attached in this note.     Assessment and Plan: 78 y.o. female with right buttocks pain.  She did not improve with 2 or 3 physical therapy sessions.  MRI does show gluteus muscle tears and tendinitis but no significant abnormalities along the area of pain at the ischial tuberosity.  Plan for trial of ischial tuberosity injection today.  If that is not sufficient would recommend trial of aquatic physical therapy.  Could evaluate the lumbar spine more effectively in the future with an MRI L-spine although I do not think that is the main source of her pain.   PDMP not reviewed this encounter. Orders Placed This Encounter  Procedures   [place] LIMITED JOINT SPACE STRUCTURES LOW BILAT(NO LINKED CHARGES)    Reason for Exam (SYMPTOM  OR DIAGNOSIS REQUIRED):   pelvic/ hip / gluteal pain    Preferred imaging location?:   [facility]   No orders of the defined types were placed in this encounter.    Discussed warning signs or symptoms. Please see discharge instructions. Patient expresses understanding.   The above documentation has been reviewed and is accurate and complete Kathy Bryant, M.D.

## 2023-10-17 ENCOUNTER — Encounter: Payer: Medicare PPO | Admitting: Physical Therapy

## 2023-10-19 ENCOUNTER — Encounter: Payer: Medicare PPO | Admitting: Physical Therapy

## 2023-10-21 ENCOUNTER — Other Ambulatory Visit: Payer: Self-pay

## 2023-10-21 DIAGNOSIS — Z122 Encounter for screening for malignant neoplasm of respiratory organs: Secondary | ICD-10-CM

## 2023-10-21 DIAGNOSIS — F17210 Nicotine dependence, cigarettes, uncomplicated: Secondary | ICD-10-CM

## 2023-10-21 DIAGNOSIS — Z87891 Personal history of nicotine dependence: Secondary | ICD-10-CM

## 2023-10-24 ENCOUNTER — Encounter: Payer: Medicare PPO | Admitting: Physical Therapy

## 2023-10-26 ENCOUNTER — Encounter: Payer: Medicare PPO | Admitting: Physical Therapy

## 2023-10-27 ENCOUNTER — Encounter: Payer: Self-pay | Admitting: Family

## 2023-10-27 ENCOUNTER — Telehealth: Payer: Self-pay | Admitting: Family

## 2023-10-27 DIAGNOSIS — I700 Atherosclerosis of aorta: Secondary | ICD-10-CM | POA: Insufficient documentation

## 2023-10-27 NOTE — Telephone Encounter (Signed)
 Sent mychart message Re: ct chest results

## 2023-10-28 ENCOUNTER — Other Ambulatory Visit: Payer: Self-pay | Admitting: Family

## 2023-10-28 DIAGNOSIS — I700 Atherosclerosis of aorta: Secondary | ICD-10-CM

## 2023-10-31 ENCOUNTER — Encounter: Payer: Medicare PPO | Admitting: Physical Therapy

## 2023-11-02 ENCOUNTER — Encounter: Payer: Medicare PPO | Admitting: Physical Therapy

## 2023-11-07 ENCOUNTER — Encounter: Payer: Medicare PPO | Admitting: Physical Therapy

## 2023-11-09 ENCOUNTER — Encounter: Payer: Medicare PPO | Admitting: Physical Therapy

## 2023-11-10 ENCOUNTER — Telehealth: Payer: Self-pay

## 2023-11-10 DIAGNOSIS — K7460 Unspecified cirrhosis of liver: Secondary | ICD-10-CM

## 2023-11-10 NOTE — Telephone Encounter (Signed)
 Patient Kathy Bryant yesterday afternoon requesting call back about notification she received to schedule her colonoscopy.  Call returned to patient.  Chart reviewed and patient was advised that she will be due for her repeat colonoscopy with Dr. Servando Snare after 04/30/24 and we will call her to schedule in August for September.  She then stated that she thinks she is supposed to be having a repeat MRI of the liver.  Stated that Dr. Servando Snare said for her to have it every 6 months.  Advised patient that I would message Dr. Annabell Sabal CMA Shawna Orleans to ask her to provide advice regarding when she would be due for her repeat MRI.  Thanks, [place], [place]

## 2023-11-14 ENCOUNTER — Encounter: Payer: Medicare PPO | Admitting: Physical Therapy

## 2023-11-15 NOTE — Telephone Encounter (Signed)
 I spoke to pt about her repeat imaging ([place]) for cirrhosis... Pt would like to continue to have this done at [facility] and AFP lab ordered for [facility].  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 Pt expressed understanding

## 2023-11-15 NOTE — Addendum Note (Signed)
 Addended by: Roena Malady on: 11/15/2023 02:12 PM   Modules accepted: Orders

## 2023-11-16 ENCOUNTER — Encounter: Payer: Medicare PPO | Admitting: Physical Therapy

## 2023-11-22 ENCOUNTER — Inpatient Hospital Stay: Admission: RE | Admit: 2023-11-22 | Source: Ambulatory Visit

## 2023-11-23 ENCOUNTER — Encounter: Payer: Medicare PPO | Admitting: Physical Therapy

## 2023-11-28 ENCOUNTER — Encounter: Payer: Medicare PPO | Admitting: Physical Therapy

## 2023-11-30 ENCOUNTER — Encounter: Payer: Medicare PPO | Admitting: Physical Therapy

## 2023-12-01 ENCOUNTER — Other Ambulatory Visit

## 2023-12-01 ENCOUNTER — Ambulatory Visit
Admission: RE | Admit: 2023-12-01 | Discharge: 2023-12-01 | Disposition: A | Discharge status: Home / Self Care | Source: Ambulatory Visit | Attending: Gastroenterology | Admitting: Gastroenterology

## 2023-12-01 DIAGNOSIS — R413 Other amnesia: Secondary | ICD-10-CM | POA: Diagnosis not present

## 2023-12-01 DIAGNOSIS — K7460 Unspecified cirrhosis of liver: Secondary | ICD-10-CM | POA: Diagnosis not present

## 2023-12-01 DIAGNOSIS — E118 Type 2 diabetes mellitus with unspecified complications: Secondary | ICD-10-CM | POA: Diagnosis not present

## 2023-12-01 LAB — COMPREHENSIVE METABOLIC PANEL WITH GFR
ALT: 22 U/L (ref 0–35)
AST: 19 U/L (ref 0–37)
Albumin: 3.9 g/dL (ref 3.5–5.2)
Alkaline Phosphatase: 54 U/L (ref 39–117)
BUN: 17 mg/dL (ref 6–23)
CO2: 28 meq/L (ref 19–32)
Calcium: 9.2 mg/dL (ref 8.4–10.5)
Chloride: 104 meq/L (ref 96–112)
Creatinine, Ser: 0.91 mg/dL (ref 0.40–1.20)
GFR: 61.33 mL/min (ref >60.00)
Glucose, Bld: 105 mg/dL — ABNORMAL HIGH (ref 70–99)
Potassium: 3.9 meq/L (ref 3.5–5.1)
Sodium: 139 meq/L (ref 135–145)
Total Bilirubin: 0.5 mg/dL (ref 0.2–1.2)
Total Protein: 6.6 g/dL (ref 6.0–8.3)

## 2023-12-01 LAB — CBC WITH DIFFERENTIAL/PLATELET
Basophils Absolute: 0 10*3/uL (ref 0.0–0.1)
Basophils Relative: 0.5 % (ref 0.0–3.0)
Eosinophils Absolute: 0.1 10*3/uL (ref 0.0–0.7)
Eosinophils Relative: 1 % (ref 0.0–5.0)
HCT: 43.8 % (ref 36.0–46.0)
Hemoglobin: 15 g/dL (ref 12.0–15.0)
Lymphocytes Relative: 35 % (ref 12.0–46.0)
Lymphs Abs: 2.3 10*3/uL (ref 0.7–4.0)
MCHC: 34.3 g/dL (ref 30.0–36.0)
MCV: 96.6 fl (ref 78.0–100.0)
Monocytes Absolute: 0.4 10*3/uL (ref 0.1–1.0)
Monocytes Relative: 6.6 % (ref 3.0–12.0)
Neutro Abs: 3.7 10*3/uL (ref 1.4–7.7)
Neutrophils Relative %: 56.9 % (ref 43.0–77.0)
Platelets: 195 10*3/uL (ref 150.0–400.0)
RBC: 4.54 Mil/uL (ref 3.87–5.11)
RDW: 14.1 % (ref 11.5–15.5)
WBC: 6.5 10*3/uL (ref 4.0–10.5)

## 2023-12-01 LAB — LIPID PANEL
Cholesterol: 222 mg/dL — ABNORMAL HIGH (ref 0–200)
HDL: 42.5 mg/dL (ref >39.00)
LDL Cholesterol: 142 mg/dL — ABNORMAL HIGH (ref 0–99)
NonHDL: 179.46
Total CHOL/HDL Ratio: 5
Triglycerides: 188 mg/dL — ABNORMAL HIGH (ref 0.0–149.0)
VLDL: 37.6 mg/dL (ref 0.0–40.0)

## 2023-12-01 LAB — MICROALBUMIN / CREATININE URINE RATIO
Creatinine,U: 156.4 mg/dL
Microalb Creat Ratio: 11.3 mg/g (ref 0.0–30.0)
Microalb, Ur: 1.8 mg/dL (ref 0.0–1.9)

## 2023-12-01 LAB — TSH: TSH: 1.31 u[IU]/mL (ref 0.35–5.50)

## 2023-12-01 LAB — B12 AND FOLATE PANEL
Folate: 6.6 ng/mL (ref >5.9)
Vitamin B-12: 254 pg/mL (ref 211–911)

## 2023-12-01 LAB — VITAMIN D 25 HYDROXY (VIT D DEFICIENCY, FRACTURES): VITD: 10.54 ng/mL — ABNORMAL LOW (ref 30.00–100.00)

## 2023-12-02 ENCOUNTER — Other Ambulatory Visit

## 2023-12-02 DIAGNOSIS — Z9049 Acquired absence of other specified parts of digestive tract: Secondary | ICD-10-CM | POA: Diagnosis not present

## 2023-12-02 DIAGNOSIS — I7090 Unspecified atherosclerosis: Secondary | ICD-10-CM | POA: Diagnosis not present

## 2023-12-02 DIAGNOSIS — I700 Atherosclerosis of aorta: Secondary | ICD-10-CM | POA: Diagnosis not present

## 2023-12-02 DIAGNOSIS — I10 Essential (primary) hypertension: Secondary | ICD-10-CM | POA: Diagnosis not present

## 2023-12-02 DIAGNOSIS — R079 Chest pain, unspecified: Secondary | ICD-10-CM | POA: Diagnosis not present

## 2023-12-02 DIAGNOSIS — E119 Type 2 diabetes mellitus without complications: Secondary | ICD-10-CM | POA: Diagnosis not present

## 2023-12-02 DIAGNOSIS — Z7985 Long-term (current) use of injectable non-insulin antidiabetic drugs: Secondary | ICD-10-CM | POA: Diagnosis not present

## 2023-12-02 DIAGNOSIS — M25559 Pain in unspecified hip: Secondary | ICD-10-CM | POA: Diagnosis not present

## 2023-12-02 DIAGNOSIS — F17210 Nicotine dependence, cigarettes, uncomplicated: Secondary | ICD-10-CM | POA: Diagnosis not present

## 2023-12-02 DIAGNOSIS — Z90710 Acquired absence of both cervix and uterus: Secondary | ICD-10-CM | POA: Diagnosis not present

## 2023-12-02 DIAGNOSIS — F419 Anxiety disorder, unspecified: Secondary | ICD-10-CM | POA: Diagnosis not present

## 2023-12-02 LAB — RPR: RPR Ser Ql: NONREACTIVE

## 2023-12-03 ENCOUNTER — Encounter: Payer: Self-pay | Admitting: Family

## 2023-12-03 ENCOUNTER — Other Ambulatory Visit: Payer: Self-pay | Admitting: Family

## 2023-12-03 DIAGNOSIS — E559 Vitamin D deficiency, unspecified: Secondary | ICD-10-CM

## 2023-12-03 MED ORDER — CHOLECALCIFEROL 1.25 MG (50000 UT) PO TABS: ORAL_TABLET | ORAL | 0 refills | Status: DC

## 2023-12-04 DIAGNOSIS — I700 Atherosclerosis of aorta: Secondary | ICD-10-CM | POA: Diagnosis not present

## 2023-12-05 ENCOUNTER — Encounter: Payer: Medicare PPO | Admitting: Physical Therapy

## 2023-12-07 ENCOUNTER — Encounter: Payer: Medicare PPO | Admitting: Physical Therapy

## 2023-12-07 ENCOUNTER — Other Ambulatory Visit: Payer: Self-pay | Admitting: Family

## 2023-12-07 ENCOUNTER — Other Ambulatory Visit: Payer: Medicare PPO

## 2023-12-07 DIAGNOSIS — E538 Deficiency of other specified B group vitamins: Secondary | ICD-10-CM

## 2023-12-07 MED ORDER — CYANOCOBALAMIN 1000 MCG/ML IJ SOLN
INTRAMUSCULAR | 4 refills | Status: DC
Start: 2023-12-07 — End: 2023-12-13

## 2023-12-08 ENCOUNTER — Encounter: Payer: Self-pay | Admitting: Gastroenterology

## 2023-12-12 ENCOUNTER — Encounter: Payer: Medicare PPO | Admitting: Physical Therapy

## 2023-12-13 ENCOUNTER — Encounter: Payer: Self-pay | Admitting: Family

## 2023-12-13 ENCOUNTER — Ambulatory Visit: Payer: Medicare PPO | Admitting: Family

## 2023-12-13 VITALS — BP 128/80 | HR 78 | Temp 97.6°F | Ht 63.0 in | Wt 180.0 lb

## 2023-12-13 DIAGNOSIS — Z13220 Encounter for screening for lipoid disorders: Secondary | ICD-10-CM | POA: Diagnosis not present

## 2023-12-13 DIAGNOSIS — Z8639 Personal history of other endocrine, nutritional and metabolic disease: Secondary | ICD-10-CM | POA: Diagnosis not present

## 2023-12-13 DIAGNOSIS — Z136 Encounter for screening for cardiovascular disorders: Secondary | ICD-10-CM

## 2023-12-13 DIAGNOSIS — E538 Deficiency of other specified B group vitamins: Secondary | ICD-10-CM

## 2023-12-13 DIAGNOSIS — E785 Hyperlipidemia, unspecified: Secondary | ICD-10-CM

## 2023-12-13 DIAGNOSIS — Z7985 Long-term (current) use of injectable non-insulin antidiabetic drugs: Secondary | ICD-10-CM | POA: Diagnosis not present

## 2023-12-13 DIAGNOSIS — E118 Type 2 diabetes mellitus with unspecified complications: Secondary | ICD-10-CM | POA: Diagnosis not present

## 2023-12-13 MED ORDER — CYANOCOBALAMIN 1000 MCG/ML IJ SOLN: INTRAMUSCULAR | 15 refills | Status: AC

## 2023-12-13 MED ORDER — SEMAGLUTIDE (1 MG/DOSE) 4 MG/3ML ~~LOC~~ SOPN: 1.0000 mg | PEN_INJECTOR | SUBCUTANEOUS | 2 refills | Status: DC

## 2023-12-13 NOTE — Progress Notes (Signed)
 Assessment & Plan:  DM (diabetes mellitus) with complications (HCC) Assessment & Plan: Chronic, well-controlled.  Patient doing well on Ozempic  1 mg. Refilled today.   Orders: -     Lipid panel; Future -     Hepatic function panel; Future -     Hemoglobin A1c; Future -     Semaglutide  (1 MG/DOSE); Inject 1 mg as directed once a week.  Dispense: 3 mL; Refill: 2  B12 deficiency Assessment & Plan: Suboptimal control.  Refilled B12 IM which she prefers to go at home  Orders: -     Cyanocobalamin ; 1000 mcg (1 mL) intramuscular injection in the thigh ( vastus lateralis) once per week for four weeks, followed by 1000 mcg IM injection once per month.  Dispense: 1 mL; Refill: 15 -     B12 and Folate Panel; Future  Encounter for lipid screening for cardiovascular disease  History of vitamin D  deficiency -     VITAMIN D  25 Hydroxy (Vit-D Deficiency, Fractures); Future  Hyperlipidemia, unspecified hyperlipidemia type Assessment & Plan: Consult with [facility] cardiology. Dr Vavalle has started rosuvastatin  10 mg daily.  Plan to recheck cholesterol, hepatic enzymes in 6wks      Return precautions given.   Risks, benefits, and alternatives of the medications and treatment plan prescribed today were discussed, and patient expressed understanding.   Education regarding symptom management and diagnosis given to patient on AVS either electronically or printed.  Return in about 3 months (around 03/14/2024).  Bascom Bossier, FNP  Subjective:    Patient ID: Kathy Bryant, female    DOB: 12-16-1945, 78 y.o.   MRN: [id number]  CC: Kathy Bryant is a 78 y.o. female who presents today for follow up.   HPI: She feels well today. No new complaints  She is doing well on ozempic  1mg  and pleased with dose. She is tolerating without changes in bowel habits.   She had been doing b12 IM at home however has missed a few months.     B12 IM 11/27/23  She is no longer on atorvastatin .   Compliant with rosuvastatin  10 mg   consult cardiology 12/02/2023 Dr Vavalle for evaluation aortic atherosclerosis.  Pending nuclear PET, changing to rosuvastatin  10 mg daily   Allergies: Simvastatin  Current Outpatient Medications on File Prior to Visit  Medication Sig Dispense Refill   albuterol  (VENTOLIN  HFA) 108 (90 Base) MCG/ACT inhaler Inhale 1-2 puffs into the lungs every 6 (six) hours as needed for wheezing or shortness of breath. 8 g 0   ALPRAZolam  (XANAX ) 0.25 MG tablet Take 1 tablet by mouth twice daily as needed for anxiety 30 tablet 1   Cholecalciferol  1.25 MG (50000 UT) TABS 50,000 units PO qwk for 8 weeks. 8 tablet 0   fluticasone  (FLONASE ) 50 MCG/ACT nasal spray Place 1 spray into both nostrils 2 (two) times daily. 16 g 0   losartan  (COZAAR ) 25 MG tablet Take 0.5 tablets (12.5 mg total) by mouth daily. 90 tablet 1   rosuvastatin  (CRESTOR ) 10 MG tablet Take 10 mg by mouth daily.     tiZANidine  (ZANAFLEX ) 2 MG tablet TAKE 1 TO 2 TABLETS BY MOUTH AT BEDTIME AS NEEDED FOR MUSCLE SPASM 30 tablet 1   No current facility-administered medications on file prior to visit.    Review of Systems  Constitutional:  Negative for chills and fever.  Respiratory:  Negative for cough.   Cardiovascular:  Negative for chest pain and palpitations.  Gastrointestinal:  Negative for nausea and vomiting.  Objective:    BP 128/80   Pulse 78   Temp 97.6 F (36.4 C) (Oral)   Ht 5\' 3"  (1.6 m)   Wt 180 lb (81.6 kg)   SpO2 97%   BMI 31.89 kg/m  BP Readings from Last 3 Encounters:  12/13/23 128/80  10/14/23 124/82  09/14/23 122/72   Wt Readings from Last 3 Encounters:  12/13/23 180 lb (81.6 kg)  10/14/23 183 lb (83 kg)  10/03/23 178 lb (80.7 kg)    Physical Exam Vitals reviewed.  Constitutional:      Appearance: She is well-developed.  Eyes:     Conjunctiva/sclera: Conjunctivae normal.  Cardiovascular:     Rate and Rhythm: Normal rate and regular rhythm.     Pulses: Normal  pulses.     Heart sounds: Normal heart sounds.  Pulmonary:     Effort: Pulmonary effort is normal.     Breath sounds: Normal breath sounds. No wheezing, rhonchi or rales.  Skin:    General: Skin is warm and dry.  Neurological:     Mental Status: She is alert.  Psychiatric:        Speech: Speech normal.        Behavior: Behavior normal.        Thought Content: Thought content normal.

## 2023-12-13 NOTE — Assessment & Plan Note (Signed)
 Consult with [facility] cardiology. Dr Vavalle has started rosuvastatin  10 mg daily.  Plan to recheck cholesterol, hepatic enzymes in 6wks

## 2023-12-13 NOTE — Assessment & Plan Note (Signed)
 Suboptimal control.  Refilled B12 IM which she prefers to go at home

## 2023-12-13 NOTE — Assessment & Plan Note (Signed)
 Chronic, well-controlled.  Patient doing well on Ozempic  1 mg. Refilled today.

## 2023-12-13 NOTE — Patient Instructions (Addendum)
 Please let me know who you would like for cirrhosis; I can place gastroenterology, [facility] or [facility] if you like.   Continue crestor  10mg  daily; please ensure that you are taking daily.  Schedule FASTING labs 6-10 weeks from now.   Restart b12 IM.

## 2023-12-14 ENCOUNTER — Encounter: Payer: Medicare PPO | Admitting: Physical Therapy

## 2023-12-14 ENCOUNTER — Telehealth: Payer: Self-pay

## 2023-12-14 ENCOUNTER — Encounter: Payer: Self-pay | Admitting: Family

## 2023-12-14 NOTE — Telephone Encounter (Unsigned)
 Copied from CRM [id number]. Topic: Clinical - Prescription Issue >> Dec 14, 2023  2:40 PM Dyann Glaser G wrote: Reason for CRM: PT CALLED STATED SHE IS OUT OF losartan  (COZAAR ) 25 MG tablet MED  BUT STATED SHE HAS NOT BEEN TAKING THEM SINCE LAST YEAR

## 2023-12-14 NOTE — Telephone Encounter (Signed)
 Copied from CRM [id number]. Topic: Clinical - Prescription Issue >> Dec 14, 2023  2:40 PM Dyann Glaser G wrote: Reason for CRM: PT CALLED STATED SHE IS OUT OF losartan  (COZAAR ) 25 MG tablet MED  BUT STATED SHE HAS NOT BEEN TAKING THEM SINCE LAST YEAR

## 2023-12-15 ENCOUNTER — Other Ambulatory Visit: Payer: Self-pay

## 2023-12-15 DIAGNOSIS — E1165 Type 2 diabetes mellitus with hyperglycemia: Secondary | ICD-10-CM

## 2023-12-15 MED ORDER — LOSARTAN POTASSIUM 25 MG PO TABS: 12.5000 mg | ORAL_TABLET | Freq: Every day | ORAL | 4 refills | Status: AC

## 2023-12-15 NOTE — Telephone Encounter (Signed)
 Spoke to pt went over notes below scheduled pt lab appt, pt verbalized understanding  Ok to  refill losartan  12.5 mg daily.  Please ask her to monitor blood pressure to ensure does not become too low.  She previously been on losartan  12.5 mg daily due to protein in her urine.  She is diabetic  Sch fasting labs one week after resuming losartan  to monitor potassium

## 2023-12-15 NOTE — Telephone Encounter (Signed)
 Done

## 2023-12-16 DIAGNOSIS — I7090 Unspecified atherosclerosis: Secondary | ICD-10-CM | POA: Diagnosis not present

## 2023-12-16 DIAGNOSIS — R079 Chest pain, unspecified: Secondary | ICD-10-CM | POA: Diagnosis not present

## 2023-12-22 ENCOUNTER — Ambulatory Visit: Payer: Self-pay | Admitting: Family

## 2023-12-22 ENCOUNTER — Other Ambulatory Visit

## 2023-12-22 DIAGNOSIS — E538 Deficiency of other specified B group vitamins: Secondary | ICD-10-CM

## 2023-12-22 DIAGNOSIS — E118 Type 2 diabetes mellitus with unspecified complications: Secondary | ICD-10-CM | POA: Diagnosis not present

## 2023-12-22 DIAGNOSIS — Z8639 Personal history of other endocrine, nutritional and metabolic disease: Secondary | ICD-10-CM | POA: Diagnosis not present

## 2023-12-22 DIAGNOSIS — Z7985 Long-term (current) use of injectable non-insulin antidiabetic drugs: Secondary | ICD-10-CM

## 2023-12-22 LAB — HEPATIC FUNCTION PANEL
ALT: 22 U/L (ref 0–35)
AST: 21 U/L (ref 0–37)
Albumin: 4.1 g/dL (ref 3.5–5.2)
Alkaline Phosphatase: 55 U/L (ref 39–117)
Bilirubin, Direct: 0.1 mg/dL (ref 0.0–0.3)
Total Bilirubin: 0.5 mg/dL (ref 0.2–1.2)
Total Protein: 6.8 g/dL (ref 6.0–8.3)

## 2023-12-22 LAB — LIPID PANEL
Cholesterol: 163 mg/dL (ref 0–200)
HDL: 43 mg/dL (ref >39.00)
LDL Cholesterol: 80 mg/dL (ref 0–99)
NonHDL: 120.13
Total CHOL/HDL Ratio: 4
Triglycerides: 201 mg/dL — ABNORMAL HIGH (ref 0.0–149.0)
VLDL: 40.2 mg/dL — ABNORMAL HIGH (ref 0.0–40.0)

## 2023-12-22 LAB — VITAMIN D 25 HYDROXY (VIT D DEFICIENCY, FRACTURES): VITD: 32.58 ng/mL (ref 30.00–100.00)

## 2023-12-22 LAB — B12 AND FOLATE PANEL
Folate: 7.3 ng/mL (ref >5.9)
Vitamin B-12: 680 pg/mL (ref 211–911)

## 2023-12-22 LAB — HEMOGLOBIN A1C: Hgb A1c MFr Bld: 6.1 % (ref 4.6–6.5)

## 2024-01-31 ENCOUNTER — Telehealth: Payer: Self-pay | Admitting: Family

## 2024-01-31 NOTE — Telephone Encounter (Signed)
*  lab appt cancelled

## 2024-01-31 NOTE — Telephone Encounter (Signed)
 Patient need lab orders.

## 2024-02-07 ENCOUNTER — Other Ambulatory Visit

## 2024-03-16 ENCOUNTER — Encounter: Payer: Self-pay | Admitting: Family

## 2024-03-16 ENCOUNTER — Ambulatory Visit: Admitting: Family

## 2024-03-16 ENCOUNTER — Other Ambulatory Visit (HOSPITAL_COMMUNITY)
Admission: RE | Admit: 2024-03-16 | Discharge: 2024-03-16 | Disposition: A | Discharge status: Home / Self Care | Source: Ambulatory Visit | Attending: Family | Admitting: Family

## 2024-03-16 VITALS — BP 118/72 | HR 69 | Temp 97.7°F | Ht 63.0 in | Wt 176.6 lb

## 2024-03-16 DIAGNOSIS — E785 Hyperlipidemia, unspecified: Secondary | ICD-10-CM

## 2024-03-16 DIAGNOSIS — N898 Other specified noninflammatory disorders of vagina: Secondary | ICD-10-CM | POA: Diagnosis not present

## 2024-03-16 DIAGNOSIS — Z1151 Encounter for screening for human papillomavirus (HPV): Secondary | ICD-10-CM | POA: Diagnosis not present

## 2024-03-16 DIAGNOSIS — E118 Type 2 diabetes mellitus with unspecified complications: Secondary | ICD-10-CM | POA: Diagnosis not present

## 2024-03-16 DIAGNOSIS — Z1211 Encounter for screening for malignant neoplasm of colon: Secondary | ICD-10-CM

## 2024-03-16 DIAGNOSIS — Z90710 Acquired absence of both cervix and uterus: Secondary | ICD-10-CM | POA: Diagnosis not present

## 2024-03-16 DIAGNOSIS — Z7985 Long-term (current) use of injectable non-insulin antidiabetic drugs: Secondary | ICD-10-CM | POA: Diagnosis not present

## 2024-03-16 DIAGNOSIS — Z124 Encounter for screening for malignant neoplasm of cervix: Secondary | ICD-10-CM | POA: Insufficient documentation

## 2024-03-16 DIAGNOSIS — Z1231 Encounter for screening mammogram for malignant neoplasm of breast: Secondary | ICD-10-CM

## 2024-03-16 NOTE — Progress Notes (Signed)
 Assessment & Plan:  DM (diabetes mellitus) with complications (HCC) Assessment & Plan: Restart Ozempic  at 0.25 mg. Maintain this dose for four weeks, considering an increase to 0.5 mg after two to three weeks if tolerated. Rotate injection sites. Keep the 1 mg dose on chart for future use.    Orders: -     Comprehensive metabolic panel with GFR -     Hemoglobin A1c  Screen for colon cancer -     Ambulatory referral to Gastroenterology  Encounter for screening mammogram for malignant neoplasm of breast -     3D Screening Mammogram, Left and Right; Future  Vaginal lesion Assessment & Plan: Punctuate internal vaginal lesion on exam which I suspect is what patient has felt.  Discussed normal variant versus pathologic finding. Pap collected of lesion/vaginal wall.  Referral for second opinion to GYN  Orders: -     Ambulatory referral to Obstetrics / Gynecology -     Cytology - PAP  Hyperlipidemia, unspecified hyperlipidemia type Assessment & Plan: Chronic, stable. Continue rosuvastatin  10 mg daily.       Return precautions given.   Risks, benefits, and alternatives of the medications and treatment plan prescribed today were discussed, and patient expressed understanding.   Education regarding symptom management and diagnosis given to patient on AVS either electronically or printed.  Return in about 3 months (around 06/16/2024).  Kathy Northern, FNP  Subjective:    Patient ID: Kathy Bryant, female    DOB: 1945-10-29, 78 y.o.   MRN: [id number]  CC: Kathy Bryant is a 78 y.o. female who presents today for follow up.   HPI: HPIDiscussed the use of AI scribe software for clinical note transcription with the patient, who gave verbal consent to proceed.  History of Present Illness   Kathy Bryant is a 78 year old female with diabetes who presents for a diabetic follow-up.  She experienced a recent interruption in her Ozempic  regimen. She had been on a 1.0 mg  dose but missed a week while at the beach. Upon returning, she resumed the 1.0 mg dose and experienced gastrointestinal symptoms, leading her to stop the medication. She has not taken Ozempic  for three weeks.  After stopping Ozempic , her appetite increased significantly, leading to weight gain.   No current gastrointestinal symptoms such as constipation, vomiting, or diarrhea.   She felt multiple 'warts' in her vagina this past week. She is not sure how long they have been there.  No new sexual partner No h/o scc, bcc  No vaginal bleeding H/o hysterectomy             12/22/23 LDL 80 A1c 6.2 Allergies: Simvastatin  Current Outpatient Medications on File Prior to Visit  Medication Sig Dispense Refill   ALPRAZolam  (XANAX ) 0.25 MG tablet Take 1 tablet by mouth twice daily as needed for anxiety 30 tablet 1   Cholecalciferol  1.25 MG (50000 UT) TABS 50,000 units PO qwk for 8 weeks. 8 tablet 0   cyanocobalamin  (VITAMIN B12) 1000 MCG/ML injection 1000 mcg (1 mL) intramuscular injection in the thigh ( vastus lateralis) once per week for four weeks, followed by 1000 mcg IM injection once per month. 1 mL 15   losartan  (COZAAR ) 25 MG tablet Take 0.5 tablets (12.5 mg total) by mouth daily. 90 tablet 4   rosuvastatin  (CRESTOR ) 10 MG tablet Take 10 mg by mouth daily.     Semaglutide , 1 MG/DOSE, 4 MG/3ML SOPN Inject 1 mg as directed once a week. 3  mL 2   albuterol  (VENTOLIN  HFA) 108 (90 Base) MCG/ACT inhaler Inhale 1-2 puffs into the lungs every 6 (six) hours as needed for wheezing or shortness of breath. (Patient not taking: Reported on 03/16/2024) 8 g 0   fluticasone  (FLONASE ) 50 MCG/ACT nasal spray Place 1 spray into both nostrils 2 (two) times daily. (Patient not taking: Reported on 03/16/2024) 16 g 0   tiZANidine  (ZANAFLEX ) 2 MG tablet TAKE 1 TO 2 TABLETS BY MOUTH AT BEDTIME AS NEEDED FOR MUSCLE SPASM (Patient not taking: Reported on 03/16/2024) 30 tablet 1   No current facility-administered  medications on file prior to visit.    Review of Systems  Constitutional:  Negative for chills and fever.  Respiratory:  Negative for cough.   Cardiovascular:  Negative for chest pain and palpitations.  Gastrointestinal:  Negative for nausea and vomiting.  Genitourinary:  Negative for dyspareunia, vaginal bleeding, vaginal discharge and vaginal pain.      Objective:    BP 118/72 (BP Location: Left Arm, Patient Position: Sitting, Cuff Size: Normal)   Pulse 69   Temp 97.7 F (36.5 C) (Oral)   Ht 5' 3 (1.6 m)   Wt 176 lb 9.6 oz (80.1 kg)   SpO2 96%   BMI 31.28 kg/m  BP Readings from Last 3 Encounters:  03/16/24 118/72  12/13/23 128/80  10/14/23 124/82   Wt Readings from Last 3 Encounters:  03/16/24 176 lb 9.6 oz (80.1 kg)  12/13/23 180 lb (81.6 kg)  10/14/23 183 lb (83 kg)    Physical Exam Vitals reviewed.  Constitutional:      Appearance: She is well-developed.  Eyes:     Conjunctiva/sclera: Conjunctivae normal.  Cardiovascular:     Rate and Rhythm: Normal rate and regular rhythm.     Pulses: Normal pulses.     Heart sounds: Normal heart sounds.  Pulmonary:     Effort: Pulmonary effort is normal.     Breath sounds: Normal breath sounds. No wheezing, rhonchi or rales.  Genitourinary:    Uterus: Absent.      Adnexa:        Right: No fullness.         Left: No fullness.       Comments: Nonfriable non tender, punctuate papule left side vaginal wall noted on exam.   No external vaginal lesions, groin, or anal lesions.  No cervix. Bimanual exam without tenderness , fullness on exam. Skin:    General: Skin is warm and dry.  Neurological:     Mental Status: She is alert.  Psychiatric:        Speech: Speech normal.        Behavior: Behavior normal.        Thought Content: Thought content normal.

## 2024-03-16 NOTE — Assessment & Plan Note (Signed)
Chronic, stable. Continue rosuvastatin 10mg daily.  

## 2024-03-16 NOTE — Assessment & Plan Note (Addendum)
 Restart Ozempic  at 0.25 mg. Maintain this dose for four weeks, considering an increase to 0.5 mg after two to three weeks if tolerated. Rotate injection sites. Keep the 1 mg dose on chart for future use.

## 2024-03-16 NOTE — Assessment & Plan Note (Signed)
 Punctuate internal vaginal lesion on exam which I suspect is what patient has felt.  Discussed normal variant versus pathologic finding. Pap collected of lesion/vaginal wall.  Referral for second opinion to GYN

## 2024-03-16 NOTE — Patient Instructions (Signed)
 Restart Ozempic  at 0.25 mg. Maintain this dose for four weeks, considering an increase to 0.5 mg after two to three weeks if tolerated. Rotate injection sites. Keep the 1 mg dose on chart for future use.   Referral to GYN  Let us  know if you dont hear back within 2 weeks in regards to an appointment being scheduled.   So that you are aware, if you are [facility] MyChart user , please pay attention to your MyChart messages as you may receive a MyChart message with a phone number to call and schedule this test/appointment own your own from our referral coordinator. This is a new process so I do not want you to miss this message.  If you are not a MyChart user, you will receive a phone call.

## 2024-03-22 LAB — CYTOLOGY - PAP
Comment: NEGATIVE
Diagnosis: UNDETERMINED — AB
High risk HPV: NEGATIVE

## 2024-03-23 ENCOUNTER — Ambulatory Visit: Payer: Self-pay | Admitting: Family

## 2024-03-30 ENCOUNTER — Telehealth: Payer: Self-pay | Admitting: Family

## 2024-03-30 NOTE — Telephone Encounter (Signed)
 Lab order needed

## 2024-04-13 ENCOUNTER — Other Ambulatory Visit

## 2024-04-13 DIAGNOSIS — E118 Type 2 diabetes mellitus with unspecified complications: Secondary | ICD-10-CM | POA: Diagnosis not present

## 2024-04-13 DIAGNOSIS — E785 Hyperlipidemia, unspecified: Secondary | ICD-10-CM | POA: Diagnosis not present

## 2024-04-13 LAB — COMPREHENSIVE METABOLIC PANEL WITH GFR
ALT: 20 U/L (ref 0–35)
AST: 18 U/L (ref 0–37)
Albumin: 3.9 g/dL (ref 3.5–5.2)
Alkaline Phosphatase: 53 U/L (ref 39–117)
BUN: 19 mg/dL (ref 6–23)
CO2: 27 meq/L (ref 19–32)
Calcium: 8.8 mg/dL (ref 8.4–10.5)
Chloride: 104 meq/L (ref 96–112)
Creatinine, Ser: 0.94 mg/dL (ref 0.40–1.20)
GFR: 58.84 mL/min — ABNORMAL LOW (ref >60.00)
Glucose, Bld: 185 mg/dL — ABNORMAL HIGH (ref 70–99)
Potassium: 3.6 meq/L (ref 3.5–5.1)
Sodium: 141 meq/L (ref 135–145)
Total Bilirubin: 0.4 mg/dL (ref 0.2–1.2)
Total Protein: 6.3 g/dL (ref 6.0–8.3)

## 2024-04-13 LAB — HEMOGLOBIN A1C: Hgb A1c MFr Bld: 6.6 % — ABNORMAL HIGH (ref 4.6–6.5)

## 2024-04-13 NOTE — Addendum Note (Signed)
 Addended by: ORLANDO KINGDOM on: 04/13/2024 09:25 AM   Modules accepted: Orders

## 2024-04-23 ENCOUNTER — Ambulatory Visit: Admitting: Obstetrics & Gynecology

## 2024-04-23 ENCOUNTER — Other Ambulatory Visit (HOSPITAL_COMMUNITY)
Admission: RE | Admit: 2024-04-23 | Discharge: 2024-04-23 | Disposition: A | Discharge status: Home / Self Care | Source: Ambulatory Visit | Attending: Obstetrics & Gynecology | Admitting: Obstetrics & Gynecology

## 2024-04-23 ENCOUNTER — Encounter: Payer: Self-pay | Admitting: Obstetrics & Gynecology

## 2024-04-23 VITALS — BP 108/68 | HR 69 | Ht 63.0 in | Wt 177.0 lb

## 2024-04-23 DIAGNOSIS — N761 Subacute and chronic vaginitis: Secondary | ICD-10-CM | POA: Diagnosis not present

## 2024-04-23 DIAGNOSIS — N898 Other specified noninflammatory disorders of vagina: Secondary | ICD-10-CM | POA: Insufficient documentation

## 2024-04-23 DIAGNOSIS — N760 Acute vaginitis: Secondary | ICD-10-CM | POA: Diagnosis not present

## 2024-04-23 NOTE — Progress Notes (Signed)
    GYNECOLOGY PROGRESS NOTE  Subjective:    Patient ID: Kathy Bryant, female    DOB: [birth year], 78 y.o.   MRN: [id number]  HPI  Patient is a 78 y.o. referred by her primary care for evaluation of vaginal rugae versus lesions. A pap smear was done of her vaginal cuff. This showed ASCUS negative HR HPV.  The following portions of the patient's history were reviewed and updated as appropriate: allergies, current medications, past family history, past medical history, past social history, past surgical history, and problem list.  Review of Systems Pertinent items are noted in HPI.   Objective:   Blood pressure 108/68, pulse 69, height 5' 3 (1.6 m), weight 177 lb (80.3 kg). Body mass index is 31.35 kg/m.   Assessment:   Vaginal lesion  Plan:  Await pathology

## 2024-04-27 LAB — SURGICAL PATHOLOGY

## 2024-04-30 ENCOUNTER — Encounter: Payer: Self-pay | Admitting: Obstetrics & Gynecology

## 2024-04-30 ENCOUNTER — Other Ambulatory Visit: Payer: Self-pay | Admitting: Obstetrics & Gynecology

## 2024-04-30 MED ORDER — ESTRADIOL 0.1 MG/GM VA CREA: TOPICAL_CREAM | VAGINAL | 12 refills | Status: AC

## 2024-04-30 NOTE — Progress Notes (Signed)
 Estrace  vaginal cream prescribed.

## 2024-05-08 ENCOUNTER — Other Ambulatory Visit: Payer: Self-pay | Admitting: Family

## 2024-05-11 ENCOUNTER — Telehealth: Payer: Self-pay

## 2024-05-11 NOTE — Telephone Encounter (Signed)
 Copied from CRM [id number]. Topic: Clinical - Medication Question >> May 11, 2024  9:44 AM Kathy Bryant wrote: Reason for CRM: Patient calling in to know why was she prescribed estradiol  (ESTRACE  VAGINAL) 0.1 MG/GM vaginal cream. Patient states that she had other medical conditions that she may not be able to use it. Patient is requesting a call back and can be reached at [phone].

## 2024-05-11 NOTE — Telephone Encounter (Signed)
 This was sent to the wrong office. Routing to Dr Starla  Copied from CRM [id number]. Topic: Clinical - Medication Question >> May 11, 2024  9:44 AM Kathy Bryant wrote: Reason for CRM: Patient calling in to know why was she prescribed estradiol  (ESTRACE  VAGINAL) 0.1 MG/GM vaginal cream. Patient states that she had other medical conditions that she may not be able to use it. Patient is requesting a call back and can be reached at [phone].

## 2024-06-03 ENCOUNTER — Other Ambulatory Visit: Payer: Self-pay | Admitting: Family

## 2024-06-03 DIAGNOSIS — E118 Type 2 diabetes mellitus with unspecified complications: Secondary | ICD-10-CM

## 2024-06-08 DIAGNOSIS — E782 Mixed hyperlipidemia: Secondary | ICD-10-CM | POA: Diagnosis not present

## 2024-06-19 ENCOUNTER — Encounter: Payer: Self-pay | Admitting: Pharmacist

## 2024-06-19 NOTE — Progress Notes (Signed)
 Pharmacy Quality Measure Review  This patient is appearing on a report for being at risk of failing the adherence measure for diabetes medications this calendar year.   Medication: Ozempic  1 mg  Last fill date: 02/09/24 for 28 day supply  Insurance report was not up to date. No action needed at this time.  Medication has been refilled as of 06/04/24 x28 ds

## 2024-06-28 ENCOUNTER — Other Ambulatory Visit: Payer: Self-pay | Admitting: Family

## 2024-06-28 DIAGNOSIS — E118 Type 2 diabetes mellitus with unspecified complications: Secondary | ICD-10-CM

## 2024-07-23 ENCOUNTER — Encounter: Payer: Self-pay | Admitting: Pharmacist

## 2024-07-23 NOTE — Progress Notes (Signed)
 Pharmacy Quality Measure Review  This patient is appearing on a report for being at risk of failing the adherence measure for hypertension (ACEi/ARB) medications this calendar year.   Medication: losartan  25 mg, 1/2 tab daily Last fill date: 03/08/24 for 90 day supply  Contacted pharmacy to facilitate refills.

## 2024-08-28 ENCOUNTER — Other Ambulatory Visit: Payer: Self-pay | Admitting: Gastroenterology

## 2024-08-28 DIAGNOSIS — K760 Fatty (change of) liver, not elsewhere classified: Secondary | ICD-10-CM

## 2024-08-28 DIAGNOSIS — Z8719 Personal history of other diseases of the digestive system: Secondary | ICD-10-CM

## 2024-08-29 ENCOUNTER — Encounter: Payer: Self-pay | Admitting: Acute Care

## 2024-08-31 ENCOUNTER — Ambulatory Visit
Admission: RE | Admit: 2024-08-31 | Discharge: 2024-08-31 | Disposition: A | Source: Ambulatory Visit | Attending: Gastroenterology

## 2024-08-31 DIAGNOSIS — K760 Fatty (change of) liver, not elsewhere classified: Secondary | ICD-10-CM | POA: Insufficient documentation

## 2024-08-31 DIAGNOSIS — Z8719 Personal history of other diseases of the digestive system: Secondary | ICD-10-CM | POA: Diagnosis present

## 2024-08-31 MED ORDER — GADOBUTROL 1 MMOL/ML IV SOLN
8.0000 mL | Freq: Once | INTRAVENOUS | Status: AC | PRN
  Administered 2024-08-31: 8 mL via INTRAVENOUS

## 2024-09-14 ENCOUNTER — Ambulatory Visit: Payer: Medicare PPO

## 2024-10-10 ENCOUNTER — Ambulatory Visit: Admitting: Family

## 2024-10-19 ENCOUNTER — Ambulatory Visit: Admit: 2024-10-19

## 2024-10-19 SURGERY — COLONOSCOPY
Anesthesia: General

## 2024-11-20 ENCOUNTER — Ambulatory Visit
